# Patient Record
Sex: Male | Born: 1935 | Race: White | Hispanic: No | Marital: Married | Smoking: Former smoker
Health system: Southern US, Community
[De-identification: ages and names within clinical notes are randomized; demographics above are authoritative.]

## PROBLEM LIST (undated history)

## (undated) DIAGNOSIS — G20A1 Parkinson's disease without dyskinesia, without mention of fluctuations: Secondary | ICD-10-CM

## (undated) DIAGNOSIS — E119 Type 2 diabetes mellitus without complications: Secondary | ICD-10-CM

## (undated) DIAGNOSIS — G20 Parkinson's disease: Secondary | ICD-10-CM

## (undated) DIAGNOSIS — E785 Hyperlipidemia, unspecified: Secondary | ICD-10-CM

## (undated) DIAGNOSIS — F32A Depression, unspecified: Secondary | ICD-10-CM

## (undated) DIAGNOSIS — R413 Other amnesia: Secondary | ICD-10-CM

## (undated) DIAGNOSIS — F329 Major depressive disorder, single episode, unspecified: Secondary | ICD-10-CM

## (undated) HISTORY — DX: Major depressive disorder, single episode, unspecified: F32.9

## (undated) HISTORY — DX: Depression, unspecified: F32.A

## (undated) HISTORY — DX: Parkinson's disease: G20

## (undated) HISTORY — DX: Type 2 diabetes mellitus without complications: E11.9

## (undated) HISTORY — DX: Hyperlipidemia, unspecified: E78.5

## (undated) HISTORY — DX: Other amnesia: R41.3

## (undated) HISTORY — DX: Parkinson's disease without dyskinesia, without mention of fluctuations: G20.A1

---
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
## 2010-08-03 ENCOUNTER — Encounter: Payer: Self-pay | Admitting: Gastroenterology

## 2010-08-09 NOTE — Letter (Signed)
Summary: Colonoscopy Letter  [facility]  [address] [place], [place] [address]   Phone: [phone]  Fax: [phone]      August 03, 2010 MRN: [id number]   Matthew [name] [address] [place], [place]  [address]   Dear Mr. Karczewski,   According to your medical record, it is time for you to schedule a Colonoscopy. The American Cancer Society recommends this procedure as a method to detect early colon cancer. Patients with a family history of colon cancer, or a personal history of colon polyps or inflammatory bowel disease are at increased risk.  This letter has been generated based on the recommendations made at the time of your procedure. If you feel that in your particular situation this may no longer apply, please contact our office.  Please call our office at [phone] to schedule this appointment or to update your records at your earliest convenience.  Thank you for cooperating with [place] to provide you with the very best care possible.   Sincerely,  Judie Petit T. Russella Dar, M.D.  [facility] [phone]

## 2011-04-10 ENCOUNTER — Ambulatory Visit: Payer: Medicare Other | Attending: Neurology | Admitting: Physical Therapy

## 2011-04-10 DIAGNOSIS — R269 Unspecified abnormalities of gait and mobility: Secondary | ICD-10-CM | POA: Insufficient documentation

## 2011-04-10 DIAGNOSIS — G20A1 Parkinson's disease without dyskinesia, without mention of fluctuations: Secondary | ICD-10-CM | POA: Insufficient documentation

## 2011-04-10 DIAGNOSIS — IMO0001 Reserved for inherently not codable concepts without codable children: Secondary | ICD-10-CM | POA: Insufficient documentation

## 2011-04-10 DIAGNOSIS — G20 Parkinson's disease: Secondary | ICD-10-CM | POA: Insufficient documentation

## 2011-04-17 ENCOUNTER — Ambulatory Visit: Payer: Medicare Other | Admitting: Rehabilitative and Restorative Service Providers"

## 2011-04-17 ENCOUNTER — Ambulatory Visit: Payer: BC Managed Care – HMO | Admitting: Physical Therapy

## 2011-04-20 ENCOUNTER — Ambulatory Visit: Payer: Medicare Other | Admitting: Physical Therapy

## 2011-04-24 ENCOUNTER — Ambulatory Visit: Payer: Medicare Other | Attending: Neurology | Admitting: Physical Therapy

## 2011-04-24 DIAGNOSIS — G20A1 Parkinson's disease without dyskinesia, without mention of fluctuations: Secondary | ICD-10-CM | POA: Insufficient documentation

## 2011-04-24 DIAGNOSIS — IMO0001 Reserved for inherently not codable concepts without codable children: Secondary | ICD-10-CM | POA: Insufficient documentation

## 2011-04-24 DIAGNOSIS — G20 Parkinson's disease: Secondary | ICD-10-CM | POA: Insufficient documentation

## 2011-04-24 DIAGNOSIS — R269 Unspecified abnormalities of gait and mobility: Secondary | ICD-10-CM | POA: Insufficient documentation

## 2011-04-27 ENCOUNTER — Ambulatory Visit: Payer: Medicare Other | Admitting: Physical Therapy

## 2011-05-01 ENCOUNTER — Ambulatory Visit: Payer: BC Managed Care – HMO | Admitting: Physical Therapy

## 2011-05-02 ENCOUNTER — Ambulatory Visit: Payer: Medicare Other | Admitting: Physical Therapy

## 2011-05-04 ENCOUNTER — Ambulatory Visit: Payer: Medicare Other | Admitting: Physical Therapy

## 2011-05-08 ENCOUNTER — Ambulatory Visit: Payer: Medicare Other | Admitting: Physical Therapy

## 2011-05-11 ENCOUNTER — Ambulatory Visit: Payer: Medicare Other | Admitting: Physical Therapy

## 2011-05-23 HISTORY — PX: CATARACT EXTRACTION W/ INTRAOCULAR LENS  IMPLANT, BILATERAL: SHX1307

## 2012-02-07 ENCOUNTER — Encounter: Payer: Self-pay | Admitting: Gastroenterology

## 2012-02-08 ENCOUNTER — Encounter: Payer: Self-pay | Admitting: Gastroenterology

## 2012-03-14 ENCOUNTER — Ambulatory Visit (AMBULATORY_SURGERY_CENTER): Payer: Medicare Other | Admitting: *Deleted

## 2012-03-14 VITALS — Ht 68.0 in | Wt 197.2 lb

## 2012-03-14 DIAGNOSIS — Z1211 Encounter for screening for malignant neoplasm of colon: Secondary | ICD-10-CM

## 2012-03-14 MED ORDER — MOVIPREP 100 G PO SOLR: ORAL | Status: DC

## 2012-03-28 ENCOUNTER — Ambulatory Visit (AMBULATORY_SURGERY_CENTER): Payer: Medicare Other | Admitting: Gastroenterology

## 2012-03-28 ENCOUNTER — Encounter: Payer: Self-pay | Admitting: Gastroenterology

## 2012-03-28 VITALS — BP 105/61 | HR 73 | Temp 97.7°F | Resp 17 | Ht 68.0 in | Wt 197.0 lb

## 2012-03-28 DIAGNOSIS — Z1211 Encounter for screening for malignant neoplasm of colon: Secondary | ICD-10-CM

## 2012-03-28 DIAGNOSIS — D126 Benign neoplasm of colon, unspecified: Secondary | ICD-10-CM

## 2012-03-28 MED ORDER — SODIUM CHLORIDE 0.9 % IV SOLN: 500.0000 mL | INTRAVENOUS | Status: DC

## 2012-03-28 NOTE — Progress Notes (Signed)
Propofol per S Camp CRNA. See scanned intra procedure report. ewm 

## 2012-03-28 NOTE — Patient Instructions (Addendum)

## 2012-03-28 NOTE — Progress Notes (Signed)
Patient did not experience any of the following events: a burn prior to discharge; a fall within the facility; wrong site/side/patient/procedure/implant event; or a hospital transfer or hospital admission upon discharge from the facility. (G8907) Patient did not have preoperative order for IV antibiotic SSI prophylaxis. (G8918)  

## 2012-03-28 NOTE — Op Note (Addendum)
[facility] [address] [place], [address]   COLONOSCOPY PROCEDURE REPORT  PATIENT: Matthew, [name]  MR#: [id number] BIRTHDATE: Aug 12, 1934 , 77  yrs. old GENDER: Male ENDOSCOPIST: Meryl Dare, MD, [facility] PROCEDURE DATE:  03/28/2012 PROCEDURE:   Colonoscopy with snare polypectomy ASA CLASS:   Class III INDICATIONS: average risk screening. MEDICATIONS: MAC sedation, administered by CRNA and propofol (Diprivan) 270mg  IV DESCRIPTION OF PROCEDURE:   After the risks benefits and alternatives of the procedure were thoroughly explained, informed consent was obtained.  A digital rectal exam revealed no abnormalities of the rectum.   The     endoscope was introduced through the anus and advanced to the cecum, which was identified by both the appendix and ileocecal valve. No adverse events experienced.   The quality of the prep was adequate, using MoviPrep The instrument was then slowly withdrawn as the colon was fully examined.  COLON FINDINGS: A sessile polyp measuring 8 mm in size was found in the sigmoid colon.  A polypectomy was performed with a cold snare. The resection was complete and the polyp tissue was completely retrieved.   Moderate diverticulosis was noted in the sigmoid colon and descending colon.   The colon was otherwise normal.  There was no diverticulosis, inflammation, polyps or cancers unless previously stated.  Retroflexed views revealed no abnormalities. The time to cecum=4 minutes 05 seconds.  Withdrawal time=15 minutes 15 seconds.  The scope was withdrawn and the procedure completed.  COMPLICATIONS: There were no complications.  ENDOSCOPIC IMPRESSION: 1.   Sessile polyp in the sigmoid colon; polypectomy performed with a cold snare 2.   Moderate diverticulosis was noted in the sigmoid colon and descending colon  RECOMMENDATIONS: 1.  Await pathology results 2.  Consider repeat colonoscopy in 5 years if polyp is adenomatous; otherwise  no plans for future colonoscopies for screening due to age  eSigned:  Meryl Dare, MD, [facility] 03/28/2012 11:50 AM Revised: 03/28/2012 11:50 AM  CC: Guerry Bruin, MD

## 2012-03-29 ENCOUNTER — Telehealth: Payer: Self-pay | Admitting: *Deleted

## 2012-03-29 NOTE — Telephone Encounter (Signed)
Busy tone attempted x3

## 2012-04-02 ENCOUNTER — Encounter: Payer: Self-pay | Admitting: Gastroenterology

## 2012-05-14 ENCOUNTER — Ambulatory Visit (INDEPENDENT_AMBULATORY_CARE_PROVIDER_SITE_OTHER): Payer: Medicare Other | Admitting: Family Medicine

## 2012-05-14 ENCOUNTER — Ambulatory Visit: Payer: Medicare Other

## 2012-05-14 VITALS — BP 114/82 | HR 83 | Temp 98.8°F | Resp 18 | Ht 68.0 in | Wt 192.0 lb

## 2012-05-14 DIAGNOSIS — R509 Fever, unspecified: Secondary | ICD-10-CM

## 2012-05-14 DIAGNOSIS — R059 Cough, unspecified: Secondary | ICD-10-CM

## 2012-05-14 DIAGNOSIS — R05 Cough: Secondary | ICD-10-CM

## 2012-05-14 DIAGNOSIS — R062 Wheezing: Secondary | ICD-10-CM

## 2012-05-14 DIAGNOSIS — R918 Other nonspecific abnormal finding of lung field: Secondary | ICD-10-CM

## 2012-05-14 DIAGNOSIS — R9389 Abnormal findings on diagnostic imaging of other specified body structures: Secondary | ICD-10-CM

## 2012-05-14 LAB — POCT CBC
Granulocyte percent: 77.1 %G (ref 37–80)
HCT, POC: 47.3 % (ref 43.5–53.7)
Hemoglobin: 14.5 g/dL (ref 14.1–18.1)
Lymph, poc: 1.6 (ref 0.6–3.4)
MCH, POC: 30.3 pg (ref 27–31.2)
MCHC: 30.7 g/dL — AB (ref 31.8–35.4)
MCV: 98.7 fL — AB (ref 80–97)
MID (cbc): 0.8 (ref 0–0.9)
MPV: 7.5 fL (ref 0–99.8)
POC Granulocyte: 7.8 — AB (ref 2–6.9)
POC LYMPH PERCENT: 15.4 %L (ref 10–50)
POC MID %: 7.5 %M (ref 0–12)
Platelet Count, POC: 270 10*3/uL (ref 142–424)
RBC: 4.79 M/uL (ref 4.69–6.13)
RDW, POC: 13.2 %
WBC: 10.1 10*3/uL (ref 4.6–10.2)

## 2012-05-14 MED ORDER — ALBUTEROL SULFATE HFA 108 (90 BASE) MCG/ACT IN AERS: 2.0000 | INHALATION_SPRAY | Freq: Four times a day (QID) | RESPIRATORY_TRACT | Status: DC | PRN

## 2012-05-14 MED ORDER — MOXIFLOXACIN HCL 400 MG PO TABS: 400.0000 mg | ORAL_TABLET | Freq: Every day | ORAL | Status: DC

## 2012-05-14 MED ORDER — ALBUTEROL SULFATE (2.5 MG/3ML) 0.083% IN NEBU
2.5000 mg | INHALATION_SOLUTION | Freq: Once | RESPIRATORY_TRACT | Status: AC
  Administered 2012-05-14: 2.5 mg via RESPIRATORY_TRACT

## 2012-05-14 NOTE — Progress Notes (Signed)
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  Objective:  Elderly gentleman with audible wheezing, no tachypnea or labored breathing HEENT:  Clear oroph with mucopurulent nasal mucous, TM's dull Neck: supple, no adenopathy Chest:  Exp. Wheezing, diffuse bibasilar rales Heart: regular, no murmur or gallop Ext:  No edema or calf tenderness. [facility] reading (PRIMARY) by  Dr. Milus Glazier  CXR: hazy RML with diffuse heavy markings.  Results for orders placed in visit on 05/14/12  POCT CBC      Component Value Range   WBC 10.1  4.6 - 10.2 K/uL   Lymph, poc 1.6  0.6 - 3.4   POC LYMPH PERCENT 15.4  10 - 50 %L   MID (cbc) 0.8  0 - 0.9   POC MID % 7.5  0 - 12 %M   POC Granulocyte 7.8 (*) 2 - 6.9   Granulocyte percent 77.1  37 - 80 %G   RBC 4.79  4.69 - 6.13 M/uL   Hemoglobin 14.5  14.1 - 18.1 g/dL   HCT, POC 16.1  09.6 - 53.7 %   MCV 98.7 (*) 80 - 97 fL   MCH, POC 30.3  27 - 31.2 pg   MCHC 30.7 (*) 31.8 - 35.4 g/dL   RDW, POC 04.5     Platelet Count, POC 270  142 - 424 K/uL   MPV 7.5  0 - 99.8 fL    Patient has significant improvement in breath sounds (decrease wheezing) after albuterol nebulizer provided here in the clinic  Assessment:  Suspicious for early pneumonia  Plan:   1. Wheezing  DG Chest 2 View, POCT CBC, albuterol (PROVENTIL) (2.5 MG/3ML) 0.083% nebulizer solution 2.5 mg, albuterol (PROVENTIL HFA;VENTOLIN HFA) 108 (90 BASE) MCG/ACT inhaler  2. Cough  DG Chest 2 View, POCT CBC, moxifloxacin (AVELOX) 400 MG tablet  3. Fever  DG Chest 2 View, POCT CBC, moxifloxacin (AVELOX) 400 MG tablet  4. Abnormal CXR  moxifloxacin (AVELOX) 400 MG tablet, albuterol (PROVENTIL) (2.5 MG/3ML) 0.083% nebulizer solution 2.5 mg   Follow up in 48 hours, sooner if wheezing becomes more problematic

## 2012-05-14 NOTE — Patient Instructions (Addendum)
Using Your Inhaler  Proper inhaler technique is very important. Good technique assures that the medicine reaches the lungs. Poor technique results in depositing the medicine on the tongue and back of the throat rather than in the airways.  STEPS TO FOLLOW IF USING INHALER WITHOUT EXTENSION TUBE:  1. Remove cap from inhaler.  2. Shake inhaler for 5 seconds before each inhalation (breathing in).  3. Position the inhaler so that the top of the canister faces up.  4. Put your index finger on the top of the medication canister. Your thumb supports the bottom of the inhaler.  5. Open your mouth.  6. Hold the inhaler 1 to 2 inches away from your open mouth. This allows the medicine to slow down before the medicine enters the mouth.  7. Exhale (breathe out) normally and as completely as possible.  8. Press the canister down with the index finger to release the medication.  9. At the same time as the canister is pressed, inhale deeply and slowly until the lungs are completely filled. This should take 4 to 6 seconds. Keep your tongue down and out of the way.  10. Hold the medication in your lungs for up to 10 seconds (10 seconds is best). This helps the medicine get into the small airways of your lungs to work better.  11. Breathe out slowly, through pursed lips. Whistling is an example of pursed lips.  12. Wait at least 1 minute between puffs. Continue with the above steps until you have taken the number of puffs your caregiver has ordered.  13. Replace cap on inhaler.  STEPS TO FOLLOW USING AN INHALER WITH AN EXTENSION (SPACER):  1.  Remove cap from inhaler.  2. Shake inhaler for 5 seconds before each inhalation (breathing in).  3. Your caregiver has asked you to use a spacer with your inhaler. A spacer is a plastic tube with a mouthpiece on one end and an opening that connects to the inhaler on the other end. A spacer helps you take the medicine better.   4. Place the open end of the spacer onto the mouthpiece of the inhaler.  5. Position the inhaler so that the top of the canister faces up and the spacer mouthpiece faces you.  6. Put your index finger on the top of the medication canister. Your thumb supports the bottom of the inhaler and the spacer.  7. Exhale (breathe out) normally and as completely as possible.  8. Immediately after exhaling, place the spacer between your teeth and into your mouth. Close your mouth tightly around the spacer.  9. Press the canister down with the index finger to release the medication.  10. At the same time as the canister is pressed, inhale deeply and slowly until the lungs are completely filled. This should take 4 to 6 seconds. Keep your tongue down and out of the way.  11. Hold the medication in your lungs for up to 10 seconds (10 seconds is best). This helps the medicine get into the small airways of your lungs to work better. Exhale.  12. Repeat inhaling deeply through the spacer mouthpiece. Again hold that breath for up to 10 seconds (10 seconds is best). Exhale slowly. If it is difficult to take this second deep breath through the spacer, breathe normally several times through the spacer. Remove the spacer from your mouth.  13. Wait at least 1 minute between puffs. Continue with the above steps until you have taken the number of puffs   your caregiver has ordered.  14. Remove spacer from the inhaler and place cap on inhaler.  If you are using different kinds of inhalers, use your quick relief medicine to open the airways 10 - 15 minutes before using a steroid. If you are unsure which inhalers to use and the order of using them, ask your caregiver, nurse, or respiratory therapist.  If you are using a steroid inhaler, rinse your mouth with water after your last puff and then spit out the water. Do not swallow the water.  Avoid the following:    Inhaling before or after starting the spray of medicine. It takes practice to coordinate your breathing with triggering the spray.   Inhaling through the nose (rather than the mouth) when triggering the spray.  HOW TO DETERMINE IF YOUR INHALER IS FULL OR NEARLY EMPTY:   Determine when an inhaler is empty. You cannot know when an inhaler is empty by shaking it. A few inhalers are now being made with dose counters. Ask your caregiver for a prescription that has a dose counter if you feel you need that extra help.   If your inhaler does not have a counter, check the number of doses in the inhaler before you use it. The canister or box will list the number of doses in the canister. Divide the total number of doses in the canister by the number you will use each day to find how many days the canister will last. (For example, if your canister has 200 doses and you take 2 puffs, 4 times each day, which is 8 puffs a day. Dividing 200 by 8 equals 25. The canister should last 25 days.) Using a calendar, count forward that many days to see when your inhaler will run out. Write the refill date on a calendar or your canister.   Remember, if you need to take extra doses, the inhaler will empty sooner than you figured. Be sure you have a refill before your canister runs out. Refill your inhaler 7 to 10 days before it runs out.  HOME CARE INSTRUCTIONS     Do not use the inhaler more than your caregiver tells you. If you are still wheezing and are feeling tightness in your chest, call your caregiver.   Keep an adequate supply of medication. This includes making sure the medicine is not expired, and you have a spare inhaler.   Follow your caregiver or inhaler insert directions for cleaning the inhaler and spacer.  SEEK MEDICAL CARE IF:     Symptoms are only partially relieved with your inhaler.   You are having trouble using your inhaler.   You experience some increase in phlegm.    You develop a fever of 100.5 F (38.1 C).  SEEK IMMEDIATE MEDICAL CARE IF:     You feel little or no relief with your inhalers. You are still wheezing and are feeling shortness of breath and/or tightness in your chest.   You have side effects such as dizziness, headaches or fast heart rate.   You have chills, fever, night sweats or an oral temperature above 102 F (38.9 C).   Phlegm production increases a lot, or there is blood in the phlegm.  MAKE SURE YOU:     Understand these instructions.   Will watch your condition.   Will get help right away if you are not doing well or get worse.  Document Released: 05/05/2000 Document Revised: 07/31/2011 Document Reviewed: 02/23/2009  ExitCare Patient Information 2013 [employer].

## 2012-09-12 ENCOUNTER — Telehealth: Payer: Self-pay | Admitting: *Deleted

## 2012-09-12 NOTE — Telephone Encounter (Signed)
patient is requesting gralise-600mg  prescription,faxed to [facility] 90day supply.

## 2012-09-13 MED ORDER — GABAPENTIN (ONCE-DAILY) 600 MG PO TABS: 1200.0000 mg | ORAL_TABLET | Freq: Every day | ORAL | Status: DC

## 2012-09-18 ENCOUNTER — Ambulatory Visit (INDEPENDENT_AMBULATORY_CARE_PROVIDER_SITE_OTHER): Payer: Medicare Other | Admitting: Nurse Practitioner

## 2012-09-18 ENCOUNTER — Encounter: Payer: Self-pay | Admitting: Nurse Practitioner

## 2012-09-18 VITALS — BP 109/70 | HR 81 | Ht 66.0 in | Wt 191.0 lb

## 2012-09-18 DIAGNOSIS — G20A1 Parkinson's disease without dyskinesia, without mention of fluctuations: Secondary | ICD-10-CM

## 2012-09-18 DIAGNOSIS — R413 Other amnesia: Secondary | ICD-10-CM

## 2012-09-18 DIAGNOSIS — G20 Parkinson's disease: Secondary | ICD-10-CM

## 2012-09-18 DIAGNOSIS — R269 Unspecified abnormalities of gait and mobility: Secondary | ICD-10-CM

## 2012-09-18 NOTE — Progress Notes (Signed)
HPI: Patient returns for followup after last visit with Dr. Vickey Huger 01/10/2011. Has a history of Parkinson's disease. He reports that he has had good tremor control he has had several falls in the last year and a half, mostly when he was not paying attention to what he was doing. He denies shuffling gait,  freezing spells, stiffness,  difficulty turning over in bed, swallowing difficulty, hypophonia. He had a neuro psychiatric testing at [facility] January 2013 he was felt overall that he might have mild cognitive impairment. It was felt that his symptoms could also be caused by situational factors and this should be followed over time. He has no new complaints today   ROS:  neg  Physical Exam General: well developed, well nourished, seated, in no evident distress Head: head normocephalic and atraumatic. Oropharynx benign Neck: supple with no carotid or supraclavicular bruits Cardiovascular: regular rate and rhythm, no murmurs  Neurologic Exam Mental Status: Awake and fully alert. Oriented to place and time.  Attention span, concentration and fund of knowledge appropriate. Mood and affect appropriate.  Cranial Nerves: Fundoscopic exam reveals sharp disc margins. Pupils equal, briskly reactive to light. Extraocular movements full without nystagmus. Visual fields full to confrontation. Hearing intact and symmetric to finger snap. Left lower facial droop mild. Tongue, palate move normally and symmetrically. Neck flexion and extension normal.  Motor: Normal bulk and tone. Normal strength in all tested extremity muscles. No rigidity Sensory.: intact to touch and pinprick and vibratory.  Coordination: Rapid alternating movements slowed  Finger-to-nose performed accurately bilaterally. Gait and Station: Arises from chair without arms. Stance is wide base. Ambulated 210 feet in 1 minute 15 seconds. No decreased arm swing. No difficulty with turns   Reflexes: 2+ and symmetric. Toes downgoing.      ASSESSMENT: Parkinson's disease currently stable with Mirapex 0.25 mg twice daily  Mild cognitive impairment per neurocognitive testing patient wants to wait until next visit to get retested      PLAN:  Continue Mirapex 0.25 twice daily, call for refills Neuropsych testing to be repeated sometime within the next year Continue to exercise for overall general health and well-being Healthy diet Followup in 6 months  Nilda Riggs, GNP-BC APRN

## 2012-09-18 NOTE — Patient Instructions (Addendum)
Parkinson's disease is stable Continue Mirapex 0.25 twice daily, call for refills Neuropsych testing to be repeated sometime within the next year Continue to exercise for overall general health and well-being Healthy diet Followup in 6 months

## 2013-03-20 ENCOUNTER — Ambulatory Visit: Payer: Medicare Other | Admitting: Nurse Practitioner

## 2013-04-01 ENCOUNTER — Ambulatory Visit (INDEPENDENT_AMBULATORY_CARE_PROVIDER_SITE_OTHER): Payer: Medicare Other | Admitting: Nurse Practitioner

## 2013-04-01 ENCOUNTER — Encounter (INDEPENDENT_AMBULATORY_CARE_PROVIDER_SITE_OTHER): Payer: Self-pay

## 2013-04-01 ENCOUNTER — Telehealth: Payer: Self-pay | Admitting: Nurse Practitioner

## 2013-04-01 ENCOUNTER — Encounter: Payer: Self-pay | Admitting: Nurse Practitioner

## 2013-04-01 VITALS — BP 106/70 | HR 71 | Ht 68.25 in | Wt 193.0 lb

## 2013-04-01 DIAGNOSIS — R269 Unspecified abnormalities of gait and mobility: Secondary | ICD-10-CM

## 2013-04-01 DIAGNOSIS — G20 Parkinson's disease: Secondary | ICD-10-CM

## 2013-04-01 DIAGNOSIS — R413 Other amnesia: Secondary | ICD-10-CM

## 2013-04-01 DIAGNOSIS — G20A1 Parkinson's disease without dyskinesia, without mention of fluctuations: Secondary | ICD-10-CM

## 2013-04-01 MED ORDER — GABAPENTIN (ONCE-DAILY) 600 MG PO TABS: 1200.0000 mg | ORAL_TABLET | Freq: Every day | ORAL | Status: DC

## 2013-04-01 NOTE — Patient Instructions (Signed)
Parkinson's symptoms are stable continue Mirapex call for refills We will renew Gralise 3 month  With refills  F/U 6 months  Next visit with Dr. Vickey Huger

## 2013-04-01 NOTE — Progress Notes (Signed)
[facility]  PATIENT: TYREE FLUHARTY DOB: 12-12-1934   REASON FOR VISIT: Followup for Parkinson's disease   HISTORY OF PRESENT ILLNESS: Mr. Scarfo, 78 year old male returns for followup. He has a history of Parkinson's disease with good control of tremor. He denies any falls since last seen. He does not use an assistive device. He feels  his Parkinson's disease is stable. He denies any shuffling gait, freezing spells, stiffness difficulty turning over in bed, hypophonia or swallowing difficulties. He needs refills today. No new neurologic complaints   HISTORY: Has a history of Parkinson's disease. He reports that he has had good tremor control he has had several falls in the last year and a half, mostly when he was not paying attention to what he was doing. He denies shuffling gait, freezing spells, stiffness, difficulty turning over in bed, swallowing difficulty, hypophonia. He had a neuro psychiatric testing at [facility] January 2013 he was felt overall that he might have mild cognitive impairment. It was felt that his symptoms could also be caused by situational factors and this should be followed over time. He has no new complaints today    REVIEW OF SYSTEMS: Full 14 system review of systems performed and notable only for:  Constitutional: N/A  Cardiovascular: N/A  Ear/Nose/Throat: N/A  Skin: N/A  Eyes: N/A  Respiratory: N/A  Gastroitestinal: N/A  Hematology/Lymphatic: N/A  Endocrine: N/A Musculoskeletal:N/A  Allergy/Immunology: N/A  Neurological: N/A Psychiatric: Depression, seen by Dr. Talbert Forest  ALLERGIES: No Known Allergies  HOME MEDICATIONS: Outpatient Prescriptions Prior to Visit  Medication Sig Dispense Refill  . atorvastatin (LIPITOR) 40 MG tablet Take 40 mg by mouth daily.      . Gabapentin, PHN, (GRALISE) 600 MG TABS Take 1,200 mg by mouth daily.  180 tablet  0  . glimepiride (AMARYL) 2 MG tablet Take 2 mg by mouth daily before breakfast.       . Linagliptin-Metformin HCl (JENTADUETO) 2.5-500 MG TABS Take by mouth 2 (two) times daily.      Marland Kitchen MOVIPREP 100 G SOLR moviprep as directed. No substitutions  1 kit  0  . moxifloxacin (AVELOX) 400 MG tablet Take 1 tablet (400 mg total) by mouth daily.  7 tablet  0  . PARoxetine (PAXIL) 20 MG tablet Take 20 mg by mouth every morning.      . pramipexole (MIRAPEX) 0.25 MG tablet Take 0.25 mg by mouth 2 (two) times daily.      Marland Kitchen zolpidem (AMBIEN) 10 MG tablet Take 10 mg by mouth at bedtime as needed.      Marland Kitchen albuterol (PROVENTIL HFA;VENTOLIN HFA) 108 (90 BASE) MCG/ACT inhaler Inhale 2 puffs into the lungs every 6 (six) hours as needed for wheezing.  1 Inhaler  0   No facility-administered medications prior to visit.    PAST MEDICAL HISTORY: Past Medical History  Diagnosis Date  . Depression   . Diabetes mellitus   . Hyperlipidemia   . Parkinson's disease   . Memory loss     PAST SURGICAL HISTORY: Past Surgical History  Procedure Laterality Date  . Tonsillectomy  1944  . Cataract extraction w/ intraocular lens  implant, bilateral  2013    bilateral    FAMILY HISTORY: Family History  Problem Relation Age of Onset  . Colon cancer Neg Hx   . Stomach cancer Neg Hx     SOCIAL HISTORY: History   Social History  . Marital Status: Married    Spouse Name: Earley Abide    Number  of Children: 1  . Years of Education: 12   Occupational History  . Not on file.   Social History Main Topics  . Smoking status: Former Smoker    Types: Cigarettes    Quit date: 03/14/1974  . Smokeless tobacco: Never Used  . Alcohol Use: No  . Drug Use: No  . Sexual Activity: Not on file   Other Topics Concern  . Not on file   Social History Narrative   Patient is married Licensed conveyancer).   Patient has one son.   Patient is retired.   Patient has a high school education.   Patient is left-handed.   Patient drinks about 20 cups of coffee daily.     PHYSICAL EXAM  Filed Vitals:   04/01/13 1423    BP: 106/70  Pulse: 71  Height: 5' 8.25" (1.734 m)  Weight: 193 lb (87.544 kg)   Body mass index is 29.12 kg/(m^2).  Generalized: Well developed, in no acute distress  Head: normocephalic and atraumatic,. Oropharynx benign  Neck: Supple, no carotid bruits  Cardiac: Regular rate rhythm, no murmur  Musculoskeletal: Brace to right knee Neurological examination   Mentation: Alert oriented to time, place, history taking. Follows all commands speech and language fluent  Cranial nerve II-XII: Pupils were equal round reactive to light extraocular movements were full, visual field were full on confrontational test. Mild left facial droop. Hearing was intact to finger rubbing bilaterally. Uvula tongue midline. head turning and shoulder shrug and were normal and symmetric.Tongue protrusion into cheek strength was normal. Motor: normal bulk and tone, full strength in the BUE, BLE, fine finger movements normal, no pronator drift. No focal weakness, no cogwheeling Sensory: normal and symmetric to light touch, pinprick, and  vibration  Coordination: finger-nose-finger, no dysmetria Reflexes: Brachioradialis 2/2, biceps 2/2, triceps 2/2, patellar 2/2, Achilles 2/2, plantar responses were flexor bilaterally. Gait and Station: Rising up from seated position without assistance, wide based  Stance. Ambulated 210 feet in 1 minute 10 seconds,  good arm swing, smooth turning.   DIAGNOSTIC DATA (LABS, IMAGING, TESTING) - I reviewed patient records, labs, notes, testing and imaging myself where available.  Lab Results  Component Value Date   WBC 10.1 05/14/2012   HGB 14.5 05/14/2012   HCT 47.3 05/14/2012   MCV 98.7* 05/14/2012     ASSESSMENT AND PLAN  78 y.o. year old male  has a past medical history of Depression; Diabetes mellitus; Hyperlipidemia; Parkinson's disease; and Memory loss. here in followup. He does not want repeat neuro cognitive testing  Parkinson's symptoms are stable continue Mirapex  call for refills We will renew Gralise 3 month  With refills  F/U 6 months  Next visit with Dr. Vickey Huger  Nilda Riggs, [facility], APRN  [facility] [address] [place], [place] [address] [phone]

## 2013-04-13 ENCOUNTER — Other Ambulatory Visit: Payer: Self-pay | Admitting: Neurology

## 2013-10-01 ENCOUNTER — Ambulatory Visit: Payer: Medicare Other | Admitting: Neurology

## 2013-10-07 ENCOUNTER — Ambulatory Visit: Payer: Medicare Other | Admitting: Adult Health

## 2013-10-21 ENCOUNTER — Telehealth: Payer: Self-pay | Admitting: Neurology

## 2013-10-21 ENCOUNTER — Encounter: Payer: Self-pay | Admitting: Neurology

## 2013-10-21 ENCOUNTER — Encounter (INDEPENDENT_AMBULATORY_CARE_PROVIDER_SITE_OTHER): Payer: Self-pay

## 2013-10-21 ENCOUNTER — Ambulatory Visit (INDEPENDENT_AMBULATORY_CARE_PROVIDER_SITE_OTHER): Payer: Commercial Managed Care - HMO | Admitting: Neurology

## 2013-10-21 VITALS — BP 107/71 | HR 66 | Resp 18 | Wt 189.0 lb

## 2013-10-21 DIAGNOSIS — G252 Other specified forms of tremor: Secondary | ICD-10-CM

## 2013-10-21 DIAGNOSIS — G20 Parkinson's disease: Secondary | ICD-10-CM | POA: Insufficient documentation

## 2013-10-21 DIAGNOSIS — G250 Essential tremor: Secondary | ICD-10-CM

## 2013-10-21 DIAGNOSIS — R488 Other symbolic dysfunctions: Secondary | ICD-10-CM

## 2013-10-21 DIAGNOSIS — G4723 Circadian rhythm sleep disorder, irregular sleep wake type: Secondary | ICD-10-CM | POA: Insufficient documentation

## 2013-10-21 DIAGNOSIS — G4701 Insomnia due to medical condition: Secondary | ICD-10-CM

## 2013-10-21 DIAGNOSIS — F482 Pseudobulbar affect: Secondary | ICD-10-CM

## 2013-10-21 MED ORDER — PAROXETINE HCL 20 MG PO TABS: 20.0000 mg | ORAL_TABLET | Freq: Every evening | ORAL | Status: DC

## 2013-10-21 MED ORDER — PRAMIPEXOLE DIHYDROCHLORIDE 0.25 MG PO TABS: 0.2500 mg | ORAL_TABLET | ORAL | Status: DC

## 2013-10-21 NOTE — Addendum Note (Signed)
Addended by: Larey Seat on: 10/21/2013 02:16 PM   Modules accepted: Orders

## 2013-10-21 NOTE — Telephone Encounter (Signed)
Left message for patient regarding appointment with Dr. Rexene Alberts on 01/22/14 per Dr. Edwena Felty instructions.

## 2013-10-21 NOTE — Progress Notes (Signed)
[facility]   Provider:  Larey Seat, [place] D  Referring Provider: Haywood Pao, MD Primary Care Physician:  Matthew Pao, MD  Chief Complaint  Patient presents with  . Follow-up    Room 10  . Parkinson's    mmse  29/30  AFT- 11  . Memory Loss    HPI:  Matthew [name] is a 79 y.o.. Caucasian, mattied, left handed  male, who   is seen here as a  revisit  from Dr. Osborne [name] for a  gait disorder , not in the classical manifestation of the PD  disease and sleep disorder. He has microsleeps on Mirapex.   MatthewPena has little to no tremor,but sudden overshooting movements - myoclonus.    Matthew [name] reports having increasing difficulties walking, currently suffering a lot of pain form knee arthritis, right knee. Due to his poorly controlled diabetes, a surgery was not planned yet.  He has fallen about once a week, his voice is still dysphonic but he can modulate the volume well. He has no dysphagia.  When he walks he tends to keep his upper body forward bend and he falls exactly that way- "his tiltometer is off". He also is sleepy all day and has trouble to sleep in the night.  His son was just diagnosed with sleep apnea and started using a CPAP, which made him concerned about having the same condition.  He has 2 -3 nocturia breaks at night, likes to drink coffee thorughout the day. He watches TV from his recliner , not in the bedroom. He can not s longer sleep in his bed. He prefers to sleep in a recliner. He loves the TV for background noise.  He snores loudly and breathe irregular, his wife confirmed. His wife craves a cool, dark and quiet bedroom instead. He is sleeping with a bright light on.  He falls asleep anytime w between 10 AM and 10 PM, but once in the bedroom he is worrying about things and cannot find rest.  Takes Paxil for affect incontinence in the evening - I asked him to change that.  We will evaluate him with a SPLIT  study.   Review of Systems: Out of a complete 14 system review, the patient complains of only the following symptoms, and all other reviewed systems are negative.  A depression score was 4 points today-  the Epworth sleepiness score was not obtained, neither the fatigue severity score, a Mini-Mental status examination revealed 29/30 points.  How likely are you to doze in the following situations: 0 = not likely, 1 = slight chance, 2 = moderate chance, 3 = high chance  Sitting and Reading?                                                      3       Watching Television?                                                     3 Sitting inactive in a public place (theater, church , waiting room  or meeting)?  3 Lying down in the afternoon when circumstances  permit? 2 Sitting and talking to someone?                                         0 Sitting quietly after lunch without alcohol?                        2 In a car, while stopped for a few minutes in traffic?         3 As a passenger in a car for an hour without a break?      2  Total =  18 points    History   Social History  . Marital Status: Married    Spouse Name: Matthew [name]    Number of Children: 1  . Years of Education: 12   Occupational History  . Not on file.   Social History Main Topics  . Smoking status: Former Smoker    Types: Cigarettes    Quit date: 03/14/1974  . Smokeless tobacco: Never Used  . Alcohol Use: No  . Drug Use: No  . Sexual Activity: Not on file   Other Topics Concern  . Not on file   Social History Narrative   Patient is married Personal assistant).   Patient has one son.   Patient is retired.   Patient has a high school education.   Patient is left-handed.   Patient drinks about 20 cups of coffee daily.    Family History  Problem Relation Age of Onset  . Colon cancer Neg Hx   . Stomach cancer Neg Hx   . Dementia Mother     Past Medical History  Diagnosis Date  . Depression   . Diabetes mellitus    . Hyperlipidemia   . Parkinson's disease   . Memory loss     Past Surgical History  Procedure Laterality Date  . Tonsillectomy  1944  . Cataract extraction w/ intraocular lens  implant, bilateral  2013    bilateral    Current Outpatient Prescriptions  Medication Sig Dispense Refill  . ACCU-CHEK COMPACT PLUS test strip daily.      Marland Kitchen ACCU-CHEK SOFTCLIX LANCETS lancets daily.      Marland Kitchen atorvastatin (LIPITOR) 40 MG tablet Take 40 mg by mouth daily.      . Gabapentin, PHN, (GRALISE) 600 MG TABS Take 1,200 mg by mouth daily.  180 tablet  1  . glimepiride (AMARYL) 2 MG tablet Take 2 mg by mouth daily before breakfast.      . INVOKANA 300 MG TABS 1 tablet daily.      . Linagliptin-Metformin HCl (JENTADUETO) 2.5-500 MG TABS Take by mouth 2 (two) times daily.      Marland Kitchen LORazepam (ATIVAN) 0.5 MG tablet 1 tablet at bedtime.      Marland Kitchen PARoxetine (PAXIL) 20 MG tablet Take 20 mg by mouth every morning.      . pramipexole (MIRAPEX) 0.25 MG tablet TAKE 1 TABLET IN THE       MORNING AND 1 TABLET AT    NOON  180 tablet  1  . temazepam (RESTORIL) 15 MG capsule as needed.      . zolpidem (AMBIEN) 10 MG tablet Take 10 mg by mouth at bedtime as needed.       No current facility-administered medications for this visit.    Allergies as of 10/21/2013  . (No Known Allergies)  Vitals: BP 107/71  Pulse 66  Resp 18  Wt 189 lb (85.73 kg) Last Weight:  Wt Readings from Last 1 Encounters:  10/21/13 189 lb (85.73 kg)   Last Height:   Ht Readings from Last 1 Encounters:  04/01/13 5' 8.25" (1.734 m)    Physical exam:  General: The patient is awake, alert and appears not in acute distress. The patient is well groomed. Head: Normocephalic, atraumatic. Neck is supple. Mallampati 2, neck circumference:17.5 inches ,  TMJ.  Cardiovascular:  Regular rate and rhythm, without  murmurs or carotid bruit, and without distended neck veins. Respiratory: Lungs are clear to auscultation. Skin:  Without evidence of  edema, or rash Trunk: BMI is  elevated and patient  has propulsion while walking, "bend"  posture.  Neurologic exam : The patient is awake and alert, oriented to place and time.  Memory subjective  described as intact.  There is a normal attention span & concentration ability. Speech is fluent without  dysarthria, but mild  dysphonia , not  aphasia. Mood and affect are appropriate on PAXIL- the patient had pseudobulbar affects. . The patient reports inability to write cursive and instead has to slowly print. " words cannot go to paper as I intended " and " I write the wrong letter "-   Cranial nerves: Pupils are equal and briskly reactive to light. Funduscopic exam without  evidence of pallor or edema.  Extraocular movements  in vertical and horizontal planes intact and without nystagmus. Visual fields by finger perimetry are restricted upward.  Hearing to finger rub intact, tinnitus - left .  Facial sensation intact to fine touch.  Facial motor strength is asymmetric- slight droop on the left side- he has facial zoster at that location. Tongue and uvula move midline.  Motor exam:   Elevated  Tone, cogwheeling on the left biceps.  normal muscle bulk and symmetricstrength in all extremities.  Sensory:  Fine touch, pinprick and vibration were tested , reduced over ankles.  Proprioception is decreased in feet.   Coordination: Rapid alternating movements in the fingers/hands is tested and slowed  Finger-to-nose maneuver with  evidence of  dysmetria or tremor. The tremor is atypical for PD.   Gait and station: Patient walks without assistive device , needs assistance to  climb up to the exam table.  Shuffling gait . Stance is stable and normal. Turns are fragmented. Romberg testing is  Positive for forward drift. .  Deep tendon reflexes: in the upper and lower extremities are brisk . symmetric and intact.   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 Shuffling gait.  No dysphagia.  2) poor lseep hygiene. 3) mirapex likely induced excessive sleepiness. Paxil should be taken in AM , as it can induce RLS,/PLMs 4)MMSE 29-30 confirmed normal mental status.   Plan:  Treatment plan and additional workup : Reduce Mirapex to one in PM - near bedtime.  Sleep time should be 11 Pm. No caffeine after lunch.  Paxil in AM . SPLIT study at [facility] . Dr. Rexene Alberts for a movement disorder consult, this patient is little medication .                HISTORY OF PRESENT ILLNESS: last visit with CM;  Mr. Kirn, 79 year old male returns for followup. He has a history of Parkinson's disease with good control  of tremor. He denies any falls since last seen. He does not use an assistive device. He feels  his Parkinson's disease is stable. He denies any shuffling gait, freezing spells, stiffness difficulty turning over in bed, hypophonia or swallowing difficulties. He needs refills today. No new neurologic complaints   HISTORY: Has a history of Parkinson's disease. He reports that he has had good tremor control he has had several falls in the last year and a half, mostly when he was not paying attention to what he was doing. He denies shuffling gait, freezing spells, stiffness, difficulty turning over in bed, swallowing difficulty, hypophonia. He had a neuro psychiatric testing at [facility] January 2013 he was felt overall that he might have mild cognitive impairment. It was felt that his symptoms could also be caused by situational factors and this should be followed over time. He has no new complaints today   [facility] [address] [place], [place] [address] [phone]

## 2013-10-21 NOTE — Patient Instructions (Signed)
Progressive Supranuclear Palsy Progressive supranuclear palsy (PSP) is a rare brain disorder. It causes serious and permanent problems with control of gait and balance. The symptoms of PSP are caused by a gradual deterioration of brain cells in a few tiny but important places at the base of the brain. This region is called the brainstem. SYMPTOMS   The most obvious sign of the disease is an inability to aim the eyes properly. This happens because of lesions in the area of the brain that coordinates eye movements. Some patients describe this effect as a blurring.  PSP patients often show changes mood and behavior. This includes:  Depression.  Apathy.  Progressive mild dementia.  The pattern of signs and symptoms can vary greatly from person to person. DIAGNOSIS  PSP is often misdiagnosed because some of its symptoms are very much like those of:  Parkinson's disease.  Alzheimer's disease.  Rare neurodegenerative disorders, such as Creutzfeldt-Jakob disease. The key to establishing the diagnosis of PSP is the identification of early gait instability and difficulty moving the eyes, the hallmark of the disease. Also important is ruling out other similar disorders, some of which are treatable. Although PSP gets progressively worse, no one dies from PSP itself. TREATMENT  There is currently no effective treatment for PSP. Scientists are searching for better ways to manage the disease.  In some patients, the slowness, stiffness, and balance problems of PSP may respond to antiparkinsonian agents such as levodopa, or levodopa combined with anticholinergic agents. But the effect is usually temporary.  The speech, vision, and swallowing difficulties usually do not respond to any drug treatment. Another group of drugs that has been of some modest success in PSP are antidepressant medications. The most commonly used of these drugs are Elavil, and Tofranil. The anti-PSP benefit of these drugs seems  not to be related to their ability to relieve depression.  Non-drug treatment for PSP can take many forms. Patients often use weighted walking aids because of their tendency to fall backward.  Bifocals or special glasses called prisms are sometimes prescribed for PSP patients. They can correct the difficulty of looking down.  Formal physical therapy is of no proven benefit in PSP. But certain exercises can keep the joints limber.  A surgical procedure may be needed when there are swallowing disturbances. It is called a gastrostomy. This surgery involves the placement of a tube through the skin of the abdomen into the stomach (intestine) for feeding purposes. Document Released: 04/28/2002 Document Revised: 07/31/2011 Document Reviewed: 05/08/2005 [employer] Patient Information 2014 [employer].  

## 2013-10-24 ENCOUNTER — Telehealth: Payer: Self-pay | Admitting: Neurology

## 2013-10-24 MED ORDER — GABAPENTIN (ONCE-DAILY) 600 MG PO TABS: 1200.0000 mg | ORAL_TABLET | Freq: Every day | ORAL | Status: DC

## 2013-10-24 NOTE — Telephone Encounter (Signed)
Spouse calling requesting refill for Gabapentin, PHN, (GRALISE) 600 MG TABS.  Stated Rx should be sent to [facility], 90 day supply.

## 2013-10-24 NOTE — Telephone Encounter (Signed)
Rx has been sent to [facility] for 90 day supply 

## 2013-10-29 NOTE — Telephone Encounter (Signed)
Called [facility], spoke with Thurmond Butts to clarify pt's medication, which is Gabapentin 600 mg, twice a day at night, per Dr. Brett Fairy. I advised Thurmond Butts that if they need anything else to give our office a call. Ryan verbalized understanding.

## 2013-11-10 ENCOUNTER — Telehealth: Payer: Self-pay | Admitting: Neurology

## 2013-11-10 NOTE — Telephone Encounter (Signed)
FYI--Patient states Matthew [name] will be contacting our office for refill on Gabapentin.

## 2013-11-10 NOTE — Telephone Encounter (Signed)
By viewing chart, this Rx was already sent to [facility].

## 2013-11-11 ENCOUNTER — Telehealth: Payer: Self-pay | Admitting: Neurology

## 2013-11-11 NOTE — Telephone Encounter (Signed)
Rx was already sent on 06/05.  I called back.  Spoke with Randall Hiss.  He then transferred me to [facility].  They verified they did get the Rx we sent to them on 06/05.  They will proceed with refill and call us back if anything further is needed.

## 2013-11-11 NOTE — Telephone Encounter (Signed)
Leanna Sato from [facility] calling to request Gabapentin script for patient, 600 mg, 90 day supply. Please return call and advise.

## 2013-11-30 ENCOUNTER — Telehealth: Payer: Self-pay

## 2013-11-30 NOTE — Telephone Encounter (Signed)
[employer] sent [place] a letter saying they have approved our request for coverage on Gralise effective until 05/21/2014 Ref ID # [id number]

## 2013-12-05 ENCOUNTER — Telehealth: Payer: Self-pay | Admitting: *Deleted

## 2013-12-05 MED ORDER — GABAPENTIN 600 MG PO TABS: 600.0000 mg | ORAL_TABLET | Freq: Two times a day (BID) | ORAL | Status: DC

## 2013-12-05 NOTE — Telephone Encounter (Signed)
Spouse called with [facility]'s direct # [phone].

## 2013-12-05 NOTE — Telephone Encounter (Signed)
This Rx was already sent on 06/05.  We spoke with an agent from [facility] on 6/23 and they confirmed they did receive the Rx.  I called them again at [phone].  Spoke with Pam.  She verified they do have the Rx on file for this patient.  Says the order is pending the patient's return call to schedule delivery/set up payment.  Says the patient needs to call them at [phone] to take care of this.  I called the patient back.  Got no answer.  Left message.

## 2013-12-05 NOTE — Telephone Encounter (Signed)
Ms Matthew [name] called back and said they would like to request a drug change from Gralise to Gabapentin.  With their new ins plan, Gabapentin will be $0.  Please advise.  Thank you.

## 2013-12-14 ENCOUNTER — Ambulatory Visit (INDEPENDENT_AMBULATORY_CARE_PROVIDER_SITE_OTHER): Payer: Commercial Managed Care - HMO

## 2013-12-14 DIAGNOSIS — G252 Other specified forms of tremor: Secondary | ICD-10-CM

## 2013-12-14 DIAGNOSIS — R488 Other symbolic dysfunctions: Secondary | ICD-10-CM

## 2013-12-14 DIAGNOSIS — G4733 Obstructive sleep apnea (adult) (pediatric): Secondary | ICD-10-CM

## 2013-12-14 DIAGNOSIS — G4701 Insomnia due to medical condition: Secondary | ICD-10-CM

## 2013-12-14 DIAGNOSIS — R0902 Hypoxemia: Secondary | ICD-10-CM

## 2013-12-14 DIAGNOSIS — G4723 Circadian rhythm sleep disorder, irregular sleep wake type: Secondary | ICD-10-CM

## 2013-12-31 NOTE — Sleep Study (Signed)
See media tab for full report  

## 2014-01-07 ENCOUNTER — Other Ambulatory Visit: Payer: Self-pay | Admitting: *Deleted

## 2014-01-07 DIAGNOSIS — G4733 Obstructive sleep apnea (adult) (pediatric): Secondary | ICD-10-CM

## 2014-01-21 ENCOUNTER — Telehealth: Payer: Self-pay | Admitting: Neurology

## 2014-01-21 NOTE — Telephone Encounter (Signed)
Called and tried to leave a message for this patient about his recent sleep study results. I will try to call patient with results.

## 2014-01-22 ENCOUNTER — Encounter: Payer: Self-pay | Admitting: Neurology

## 2014-01-22 ENCOUNTER — Ambulatory Visit (INDEPENDENT_AMBULATORY_CARE_PROVIDER_SITE_OTHER): Payer: Commercial Managed Care - HMO | Admitting: Neurology

## 2014-01-22 ENCOUNTER — Institutional Professional Consult (permissible substitution): Payer: Medicare Other | Admitting: Neurology

## 2014-01-22 ENCOUNTER — Encounter: Payer: Self-pay | Admitting: *Deleted

## 2014-01-22 VITALS — BP 95/59 | HR 78 | Temp 98.6°F | Ht 67.0 in | Wt 180.0 lb

## 2014-01-22 DIAGNOSIS — G4733 Obstructive sleep apnea (adult) (pediatric): Secondary | ICD-10-CM

## 2014-01-22 DIAGNOSIS — F329 Major depressive disorder, single episode, unspecified: Secondary | ICD-10-CM

## 2014-01-22 DIAGNOSIS — R269 Unspecified abnormalities of gait and mobility: Secondary | ICD-10-CM

## 2014-01-22 DIAGNOSIS — R2689 Other abnormalities of gait and mobility: Secondary | ICD-10-CM

## 2014-01-22 DIAGNOSIS — F3289 Other specified depressive episodes: Secondary | ICD-10-CM

## 2014-01-22 DIAGNOSIS — G4701 Insomnia due to medical condition: Secondary | ICD-10-CM

## 2014-01-22 DIAGNOSIS — F32A Depression, unspecified: Secondary | ICD-10-CM

## 2014-01-22 NOTE — Telephone Encounter (Signed)
I tried calling the patient to inform him of his sleep study results. I was unable to reach the patient, so I will send the report to the patient along with follow up instructions.

## 2014-01-22 NOTE — Patient Instructions (Signed)
The sleep lab will get in touch with you.  I will recommend to Dr. Brett Fairy: stop Mirapex and referral to physical therapy. Do MRI brain.   Please reduce your caffeine intake and do not take more medicine than prescribed, drink more water, use your CPAP and use your cane.   I do not believe you have parkinson's disease.   Follow up with Dr. Brett Fairy about 8 weeks after starting CPAP.

## 2014-01-22 NOTE — Progress Notes (Signed)
Subjective:    Patient ID: Matthew [name] is a 79 y.o. male.  HPI    Star Age, MD, PhD [facility] [address] [place], [place] [address]  Dear Matthew [name],    I saw your patient, Matthew [name], upon your kind request in my clinic today for second opinion of his gait disorder, concern for atypical parkinsonism. The patient is accompanied by his wife today. As you know, Matthew [name] is a 78 year old right-handed gentleman with an underlying medical history of hyperlipidemia, type 2 diabetes, depression, insomnia, who has had problems with tremors, gait disorder and recurrent falls for the past 5 years, gradual in onset and progressive with time. He had snoring and poor sleep consolidation and had witnessed breathing pauses while asleep and you suggested a sleep study. He he had a split-night sleep study on 12/14/2013 which you interpreted. His baseline sleep efficiency was 61.3%. His AHI was 19.2 per hour, oxyhemoglobin desaturation nadir was 81%. He was started on CPAP at 5 cm and titrated to 6 cm. His sleep efficiency was 69.8%. I explained the sleep study findings to him and his wife.  He drinks up to 20 cups of coffee per day! You have asked him to reduce it and you have reduced his Mirapex. He has a tendency for overeating sweets. He had a problem with alcohol in the past and enrolled himself in [place]. He has R knee pain and has been using a brace for 5 years. He is to see Dr. Maureen Ralphs. He has been taking gabapentin 600 mg bid, paxil 20 mg, mirapex 0.25 mg, temazepam 15 mg qHS and ativan 0.5 mg qHS, Ambien 10 mg. He fell this AM around 1 AM in the hallway on carpet, no head injury, no LOC. Of note, he took more gabapentin and ativan than prescribed, in fact: he took 1200 mg of gabapentin, 3 pills of Ativan, one pill of temazepam and one pill of Ambien. His wife indicates that he sometimes takes more than prescribed. He still has trouble falling asleep  and he has anxiety at night because he cannot fall asleep. Sometimes he sits in a recliner and does a fair. He has not yet been set up with CPAP and is awaiting this. He has had a tremor in both hands for months.   His Past Medical History Is Significant For: Past Medical History  Diagnosis Date  . Depression   . Diabetes mellitus   . Hyperlipidemia   . Parkinson's disease   . Memory loss     His Past Surgical History Is Significant For: Past Surgical History  Procedure Laterality Date  . Tonsillectomy  1944  . Cataract extraction w/ intraocular lens  implant, bilateral  2013    bilateral    His Family History Is Significant For: Family History  Problem Relation Age of Onset  . Colon cancer Neg Hx   . Stomach cancer Neg Hx   . Dementia Mother     His Social History Is Significant For: History   Social History  . Marital Status: Married    Spouse Name: Matthew [name]    Number of Children: 1  . Years of Education: 23   Social History Main Topics  . Smoking status: Former Smoker    Types: Cigarettes    Quit date: 03/14/1974  . Smokeless tobacco: Never Used  . Alcohol Use: No  . Drug Use: No  . Sexual Activity: None   Other Topics Concern  .  None   Social History Narrative   Patient is married Personal assistant).   Patient has one son.   Patient is retired.   Patient has a high school education.   Patient is left-handed.   Patient drinks about 20 cups of coffee daily.    His Allergies Are:  No Known Allergies:   His Current Medications Are:  Outpatient Encounter Prescriptions as of 01/22/2014  Medication Sig  . ACCU-CHEK COMPACT PLUS test strip daily.  Marland Kitchen ACCU-CHEK SOFTCLIX LANCETS lancets daily.  Marland Kitchen atorvastatin (LIPITOR) 40 MG tablet Take 40 mg by mouth daily.  Marland Kitchen gabapentin (NEURONTIN) 600 MG tablet Take 1 tablet (600 mg total) by mouth 2 (two) times daily.  Marland Kitchen glimepiride (AMARYL) 2 MG tablet Take 2 mg by mouth daily before breakfast.  . INVOKANA 300 MG TABS 1 tablet  daily.  Marland Kitchen LORazepam (ATIVAN) 0.5 MG tablet 1 tablet at bedtime.  Marland Kitchen PARoxetine (PAXIL) 20 MG tablet Take 1 tablet (20 mg total) by mouth Nightly.  . pramipexole (MIRAPEX) 0.25 MG tablet Take 1 tablet (0.25 mg total) by mouth as directed.  . temazepam (RESTORIL) 15 MG capsule as needed.  . zolpidem (AMBIEN) 10 MG tablet Take 10 mg by mouth at bedtime as needed.  . [DISCONTINUED] Gabapentin, PHN, (GRALISE) 600 MG TABS Take 1,200 mg by mouth daily.  . [DISCONTINUED] Linagliptin-Metformin HCl (JENTADUETO) 2.5-500 MG TABS Take by mouth 2 (two) times daily.  :   Review of Systems:  Out of a complete 14 point review of systems, all are reviewed and negative with the exception of these symptoms as listed below:   Review of Systems  Eyes: Positive for pain and itching.    Objective:  Neurologic Exam  Physical Exam Physical Examination:   Filed Vitals:   01/22/14 1407  BP: 95/59  Pulse: 78  Temp: 98.6 F (37 C)   Upon recheck, he had a sitting blood pressure of 95/60 with a pulse of 80 and no significant orthostatic changes upon standing.  General Examination: The patient is a very pleasant 79 y.o. male in no acute distress. He appears well-developed and well-nourished and well groomed. He c/o R knee pain.   HEENT: Normocephalic, atraumatic, pupils are equal, round and reactive to light and accommodation. Funduscopic exam is normal with sharp disc margins noted. Extraocular tracking is good without limitation to gaze excursion or nystagmus noted. Normal smooth pursuit is noted. Hearing is grossly intact. Face is symmetric with normal facial animation and normal facial sensation. Speech is clear with no dysarthria noted. There is no hypophonia. There is no lip, neck/head, jaw or voice tremor. Neck is supple with full range of passive and active motion. There are no carotid bruits on auscultation. Oropharynx exam reveals: moderate mouth dryness, adequate dental hygiene and moderate airway  crowding. Mallampati is class II. Tongue protrudes centrally and palate elevates symmetrically.   Chest: Clear to auscultation without wheezing, rhonchi or crackles noted.  Heart: S1+S2+0, regular and normal without murmurs, rubs or gallops noted.   Abdomen: Soft, non-tender and non-distended with normal bowel sounds appreciated on auscultation.  Extremities: There is no pitting edema in the distal lower extremities bilaterally. Pedal pulses are intact.  Skin: Warm and dry without trophic changes noted. There are no varicose veins.  Musculoskeletal: exam reveals: Right knee pain and he is wearing a knee brace which I did not have him take off.  Neurologically:  Mental status: The patient is awake, alert and oriented in all 4 spheres. His immediate  and remote memory, attention, language skills and fund of knowledge are mildly impaired. There is no evidence of aphasia, agnosia, apraxia or anomia. Speech is clear with normal prosody and enunciation. Thought process is linear. Mood is normal and affect is blunted.  Cranial nerves II - XII are as described above under HEENT exam. In addition: shoulder shrug is normal with equal shoulder height noted. Motor exam: Normal bulk, strength and tone is noted. There is mild, intermittent, fast and low amplitude resting, postural and action tremor in both upper extremities. Romberg is not tested. Reflexes are 1+ in the upper extremities and trace in both knees. He has tenderness in the right knee. Fine motor skills and  coordination: mildly impaired bilaterally with no significant lateralization.   Cerebellar testing: No dysmetria or intention tremor on finger to nose testing. Heel to shin is  not possible. there is no truncal ataxia.  Sensory exam: intact to light touch, pinprick, vibration, temperature sense  in the upper extremities but decrease to pinprick and vibration sense in the distal lower extremities bilaterally. Gait, station and balance: He stands  with difficulty and has to push himself up. His posture is abnormal and band in both knees right more than left and he has increased lumbar lordosis. His posture is not typically Parkinsonian with a stoop more crooked. He does tend to lean a little bit to the right.he walks with his walking cane. He is quite insecure and while he does not have a parkinsonian shuffle, he does not pick up his feet well. He is insecure with turning.   Assessment and Plan:   In summary, MINORU [name] is a very pleasant 79 y.o.-year old male with an underlying medical history of hyperlipidemia, type 2 diabetes, depression, insomnia, who has had problems with tremors, gait disorder and recurrent falls for the past 5 years, gradual in onset and progressive with time.  he has had multiple falls and in fact fell last night. Thankfully he did not injure himself. I think we are dealing with a multifactorial gait disorder. Contributors are advanced age, arthritis particularly of the right knee, evidence of mild neuropathy, and prior history of excessive alcohol use as well as medication effect. He is strongly advised not to take more medication than prescribed. Of note, he is taking multiple sedating medications and is strongly advised not to take more than prescribed. In addition, he has a tremor which is bilateral and confined to the upper extremities but not typically Parkinsonian. He is on low-dose Mirapex and I would recommend that he discontinue this. I would recommend that you consider physical therapy for gait and balance training and consider an MRI brain. He was recently diagnosed with obstructive sleep apnea and will be set up with CPAP therapy and I suggested that he followup with you approximately 6-8 weeks after being set up with CPAP at home. He follows with Dr. Casimiro Needle for his depression. I explained to him that he does not look like a typical Parkinson's patient. He was not orthostatic on examination. He has an  appointment with orthopedics soon for evaluation of his right knee. I suggested he discuss with you the MRI brain, physical therapy referral and CPAP treatment. I do not need to see him back on a scheduled basis and advised him to followup with you as previously planned. His upper extremity prepped tremor may be related to his excessive caffeine intake. I advised him to reduce his caffeine intake and increase his water intake. I answered  all their questions and the patient and his wife were in agreement. Most of my 45 minute visit was spent in counseling and coordination of care and reviewing test results.   Thank you very much for allowing me to participate in the care of this nice patient. If I can be of any further assistance to you please do not hesitate to call me at [phone].  Sincerely,   Star Age, MD, PhD

## 2014-02-02 ENCOUNTER — Telehealth: Payer: Self-pay | Admitting: Neurology

## 2014-02-02 DIAGNOSIS — R0902 Hypoxemia: Secondary | ICD-10-CM

## 2014-02-02 NOTE — Telephone Encounter (Signed)
Dr. Rexene Alberts, this message was routed to you in error.  It looks as though you may be seeing him for his Parkinson's only and not osa.  This is for Dr. Brett Fairy.  Dr. Brett Fairy, please see below.  Patient will need a qualifying test for O2 that is less than 83 days old.  Would you like to order an ONO?

## 2014-02-02 NOTE — Telephone Encounter (Signed)
Pt's recent sleep study will not qualify him for O2 because the test used to justify need is over 16 days old.  Would you like to order an ONO to see if he will qualify for O2?

## 2014-02-03 NOTE — Telephone Encounter (Signed)
Ordered ONO for possible need of oxygen, is that still the medicare way to proof need or is there more documentation needed. CD

## 2014-05-27 DIAGNOSIS — G4733 Obstructive sleep apnea (adult) (pediatric): Secondary | ICD-10-CM | POA: Diagnosis not present

## 2014-06-01 DIAGNOSIS — I10 Essential (primary) hypertension: Secondary | ICD-10-CM | POA: Diagnosis not present

## 2014-06-01 DIAGNOSIS — E785 Hyperlipidemia, unspecified: Secondary | ICD-10-CM | POA: Diagnosis not present

## 2014-06-01 DIAGNOSIS — E119 Type 2 diabetes mellitus without complications: Secondary | ICD-10-CM | POA: Diagnosis not present

## 2014-06-01 DIAGNOSIS — Z125 Encounter for screening for malignant neoplasm of prostate: Secondary | ICD-10-CM | POA: Diagnosis not present

## 2014-06-08 DIAGNOSIS — Z0000 Encounter for general adult medical examination without abnormal findings: Secondary | ICD-10-CM | POA: Diagnosis not present

## 2014-06-08 DIAGNOSIS — E119 Type 2 diabetes mellitus without complications: Secondary | ICD-10-CM | POA: Diagnosis not present

## 2014-06-08 DIAGNOSIS — E785 Hyperlipidemia, unspecified: Secondary | ICD-10-CM | POA: Diagnosis not present

## 2014-06-08 DIAGNOSIS — Z5181 Encounter for therapeutic drug level monitoring: Secondary | ICD-10-CM | POA: Diagnosis not present

## 2014-06-08 DIAGNOSIS — I10 Essential (primary) hypertension: Secondary | ICD-10-CM | POA: Diagnosis not present

## 2014-06-08 DIAGNOSIS — G20 Parkinson's disease: Secondary | ICD-10-CM | POA: Diagnosis not present

## 2014-06-08 DIAGNOSIS — Z1389 Encounter for screening for other disorder: Secondary | ICD-10-CM | POA: Diagnosis not present

## 2014-06-08 DIAGNOSIS — F419 Anxiety disorder, unspecified: Secondary | ICD-10-CM | POA: Diagnosis not present

## 2014-06-10 ENCOUNTER — Emergency Department (HOSPITAL_COMMUNITY): Payer: Commercial Managed Care - HMO

## 2014-06-10 ENCOUNTER — Emergency Department (HOSPITAL_COMMUNITY)
Admission: EM | Admit: 2014-06-10 | Discharge: 2014-06-11 | Disposition: A | Discharge status: Home / Self Care | Payer: Commercial Managed Care - HMO | Attending: Emergency Medicine | Admitting: Emergency Medicine

## 2014-06-10 ENCOUNTER — Encounter (HOSPITAL_COMMUNITY): Payer: Self-pay | Admitting: *Deleted

## 2014-06-10 ENCOUNTER — Other Ambulatory Visit: Payer: Self-pay

## 2014-06-10 DIAGNOSIS — F329 Major depressive disorder, single episode, unspecified: Secondary | ICD-10-CM | POA: Insufficient documentation

## 2014-06-10 DIAGNOSIS — R079 Chest pain, unspecified: Secondary | ICD-10-CM | POA: Diagnosis present

## 2014-06-10 DIAGNOSIS — R0789 Other chest pain: Secondary | ICD-10-CM

## 2014-06-10 DIAGNOSIS — Z79899 Other long term (current) drug therapy: Secondary | ICD-10-CM | POA: Insufficient documentation

## 2014-06-10 DIAGNOSIS — E119 Type 2 diabetes mellitus without complications: Secondary | ICD-10-CM | POA: Insufficient documentation

## 2014-06-10 DIAGNOSIS — E785 Hyperlipidemia, unspecified: Secondary | ICD-10-CM | POA: Insufficient documentation

## 2014-06-10 DIAGNOSIS — Z87891 Personal history of nicotine dependence: Secondary | ICD-10-CM | POA: Diagnosis not present

## 2014-06-10 DIAGNOSIS — J984 Other disorders of lung: Secondary | ICD-10-CM | POA: Diagnosis not present

## 2014-06-10 DIAGNOSIS — G20 Parkinson's disease: Secondary | ICD-10-CM | POA: Diagnosis not present

## 2014-06-10 LAB — CBC
HEMATOCRIT: 44.6 % (ref 39.0–52.0)
Hemoglobin: 15.5 g/dL (ref 13.0–17.0)
MCH: 32.8 pg (ref 26.0–34.0)
MCHC: 34.8 g/dL (ref 30.0–36.0)
MCV: 94.5 fL (ref 78.0–100.0)
PLATELETS: 175 10*3/uL (ref 150–400)
RBC: 4.72 MIL/uL (ref 4.22–5.81)
RDW: 13.4 % (ref 11.5–15.5)
WBC: 6.9 10*3/uL (ref 4.0–10.5)

## 2014-06-10 LAB — BASIC METABOLIC PANEL
Anion gap: 6 (ref 5–15)
BUN: 20 mg/dL (ref 6–23)
CALCIUM: 9.3 mg/dL (ref 8.4–10.5)
CO2: 34 mmol/L — ABNORMAL HIGH (ref 19–32)
Chloride: 102 mEq/L (ref 96–112)
Creatinine, Ser: 1.12 mg/dL (ref 0.50–1.35)
GFR calc Af Amer: 71 mL/min — ABNORMAL LOW (ref >90)
GFR, EST NON AFRICAN AMERICAN: 61 mL/min — AB (ref >90)
Glucose, Bld: 176 mg/dL — ABNORMAL HIGH (ref 70–99)
Potassium: 4.3 mmol/L (ref 3.5–5.1)
Sodium: 142 mmol/L (ref 135–145)

## 2014-06-10 LAB — I-STAT TROPONIN, ED
Troponin i, poc: 0.01 ng/mL (ref 0.00–0.08)
Troponin i, poc: 0 ng/mL (ref 0.00–0.08)

## 2014-06-10 MED ORDER — ASPIRIN 81 MG PO CHEW: 324.0000 mg | CHEWABLE_TABLET | Freq: Once | ORAL | Status: DC

## 2014-06-10 NOTE — Discharge Instructions (Signed)
You will be contacted by the [facility] group for an outpatient stress test. Your caregiver has diagnosed you as having chest pain that is not specific for one problem, but does not require admission.  You are at low risk for an acute heart condition or other serious illness. Chest pain comes from many different causes.  SEEK IMMEDIATE MEDICAL ATTENTION IF: You have severe chest pain, especially if the pain is crushing or pressure-like and spreads to the arms, back, neck, or jaw, or if you have sweating, nausea (feeling sick to your stomach), or shortness of breath. THIS IS AN EMERGENCY. Don't wait to see if the pain will go away. Get medical help at once. Call 911 or 0 (operator). DO NOT drive yourself to the hospital.  Your chest pain gets worse and does not go away with rest.  You have an attack of chest pain lasting longer than usual, despite rest and treatment with the medications your caregiver has prescribed.  You wake from sleep with chest pain or shortness of breath.  You feel dizzy or faint.  You have chest pain not typical of your usual pain for which you originally saw your caregiver.  . Chest Wall Pain Chest wall pain is pain in or around the bones and muscles of your chest. It may take up to 6 weeks to get better. It may take longer if you must stay physically active in your work and activities.  CAUSES  Chest wall pain may happen on its own. However, it may be caused by:  A viral illness like the flu.  Injury.  Coughing.  Exercise.  Arthritis.  Fibromyalgia.  Shingles. HOME CARE INSTRUCTIONS   Avoid overtiring physical activity. Try not to strain or perform activities that cause pain. This includes any activities using your chest or your abdominal and side muscles, especially if heavy weights are used.  Put ice on the sore area.  Put ice in a plastic bag.  Place a towel between your skin and the bag.  Leave the ice on for 15-20 minutes per hour while awake  for the first 2 days.  Only take over-the-counter or prescription medicines for pain, discomfort, or fever as directed by your caregiver. SEEK IMMEDIATE MEDICAL CARE IF:   Your pain increases, or you are very uncomfortable.  You have a fever.  Your chest pain becomes worse.  You have new, unexplained symptoms.  You have nausea or vomiting.  You feel sweaty or lightheaded.  You have a cough with phlegm (sputum), or you cough up blood. MAKE SURE YOU:   Understand these instructions.  Will watch your condition.  Will get help right away if you are not doing well or get worse. Document Released: 05/08/2005 Document Revised: 07/31/2011 Document Reviewed: 01/02/2011 [facility] Patient Information 2015 [place], [place]. This information is not intended to replace advice given to you by your health care provider. Make sure you discuss any questions you have with your health care provider.

## 2014-06-10 NOTE — ED Provider Notes (Signed)
CSN: [id number]     Arrival date & time 06/10/14  2036 History   First MD Initiated Contact with Patient 06/10/14 2044     Chief Complaint  Patient presents with  . Chest Pain     (Consider location/radiation/quality/duration/timing/severity/associated sxs/prior Treatment) HPI   Matthew [name] is a(n) 80 y.o. male who presents to the ED with Cc of L sided chest pain. He has a pmh of DM, hyperlipidemia, parkinson's. He has a 40 pack year smoking history and a family hisory of brother with early MI at age 28. The patient states that he noticed fleeting left sided chest pain that occurred while he was walking down the hall today. He states that it felt like a muscle cramp on the inside of his chest. He denies SOB, nausea, diaphoresis. He states that  He has had this sensation before and it only lasts seconds at a time. He states that it usually goes away, but tonight it continued to recur lasting seconds at a time and is unprovoked. Pain is non radiating, not associated with exertion or rest, non pleuritic.   Past Medical History  Diagnosis Date  . Depression   . Diabetes mellitus   . Hyperlipidemia   . Parkinson's disease   . Memory loss    Past Surgical History  Procedure Laterality Date  . Tonsillectomy  1944  . Cataract extraction w/ intraocular lens  implant, bilateral  2013    bilateral   Family History  Problem Relation Age of Onset  . Colon cancer Neg Hx   . Stomach cancer Neg Hx   . Dementia Mother    History  Substance Use Topics  . Smoking status: Former Smoker    Types: Cigarettes    Quit date: 03/14/1974  . Smokeless tobacco: Never Used  . Alcohol Use: No    Review of Systems  Ten systems reviewed and are negative for acute change, except as noted in the HPI.    Allergies  Review of patient's allergies indicates no known allergies.  Home Medications   Prior to Admission medications   Medication Sig Start Date End Date Taking? Authorizing Provider   ACCU-CHEK COMPACT PLUS test strip daily. 10/01/13   Historical Provider, MD  ACCU-CHEK SOFTCLIX LANCETS lancets daily. 09/08/13   Historical Provider, MD  atorvastatin (LIPITOR) 40 MG tablet Take 40 mg by mouth daily.    Historical Provider, MD  gabapentin (NEURONTIN) 600 MG tablet Take 1 tablet (600 mg total) by mouth 2 (two) times daily. 12/05/13   Carmen Dohmeier, MD  glimepiride (AMARYL) 2 MG tablet Take 2 mg by mouth daily before breakfast.    Historical Provider, MD  INVOKANA 300 MG TABS 1 tablet daily. 10/02/13   Historical Provider, MD  JENTADUETO 2.5-500 MG TABS  10/27/13   Historical Provider, MD  LORazepam (ATIVAN) 0.5 MG tablet 1 tablet at bedtime. 07/31/13   Historical Provider, MD  PARoxetine (PAXIL) 20 MG tablet Take 1 tablet (20 mg total) by mouth Nightly. 10/21/13   Asencion Partridge Dohmeier, MD  pramipexole (MIRAPEX) 0.25 MG tablet Take 1 tablet (0.25 mg total) by mouth as directed. 10/21/13   Asencion Partridge Dohmeier, MD  temazepam (RESTORIL) 15 MG capsule as needed. 07/28/13   Historical Provider, MD  zolpidem (AMBIEN) 10 MG tablet Take 10 mg by mouth at bedtime as needed.    Historical Provider, MD   BP 138/60 mmHg  Pulse 81  Temp(Src) 97.9 F (36.6 C) (Oral)  Resp 18  Ht 5\' 7"  (1.702  m)  SpO2 96% Physical Exam  Constitutional: He appears well-developed and well-nourished. No distress.  HENT:  Head: Normocephalic and atraumatic.  Eyes: Conjunctivae are normal. No scleral icterus.  Neck: Normal range of motion. Neck supple. No JVD present.  Cardiovascular: Normal rate and regular rhythm.  Exam reveals no gallop and no friction rub.   No murmur heard. Pulmonary/Chest: Effort normal and breath sounds normal. No respiratory distress. He has no wheezes. He exhibits no tenderness.  Abdominal: Soft. Bowel sounds are normal. He exhibits no distension and no mass. There is no tenderness. There is no guarding.  Musculoskeletal: He exhibits no edema.  Neurological: He is alert.  Skin: Skin is warm and  dry. He is not diaphoretic.  Psychiatric: His behavior is normal.  Nursing note and vitals reviewed.   ED Course  Procedures (including critical care time) Labs Review Labs Reviewed  BASIC METABOLIC PANEL  [place], ED    Imaging Review No results found.   EKG Interpretation   Date/Time:  Wednesday June 10 2014 20:40:44 EST Ventricular Rate:  83 PR Interval:  172 QRS Duration: 76 QT Interval:  348 QTC Calculation: 408 R Axis:   57 Text Interpretation:  Sinus rhythm with Premature atrial complexes Low  voltage QRS Borderline ECG No old tracing to compare Confirmed by  Winfred Leeds  MD, SAM [id number]) on 06/10/2014 9:20:50 PM       Date: 06/10/2014  Rate: 83  Rhythm: normal sinus rhythm  QRS Axis: normal  Intervals: normal  ST/T Wave abnormalities: normal  Conduction Disutrbances:none  Narrative Interpretation:   Old EKG Reviewed:    MDM   Final diagnoses:  Atypical chest pain    9:12 PM BP 138/60 mmHg  Pulse 81  Temp(Src) 97.9 F (36.6 C) (Oral)  Resp 18  Ht 5\' 7"  (1.702 m)  SpO2 96% Patient with fleeting chest pain. He has a moderate amount of risk factors. He is cp free at this time. He took 2 adult sized asa today.    10:11 PM I spoke with Dr.  Abner Greenspan on call for [facility] neuro  Who asks for Cardiology consult. I spoke with Dr. Aundra Dubin and discussed the case. Although the patient has a Heart score of 4 due to RF and Age. His pain is very atypical. We will get a second troponin, If negative the patient will be set up for an op stress test.  12:38 AM BP 121/73 mmHg  Pulse 73  Temp(Src) 97.9 F (36.6 C) (Oral)  Resp 17  Ht 5\' 7"  (1.702 m)  SpO2 99% Patient is much better after pain meds. I discussed the imaging findings. She will be discharged with supportive care. F/U with her pcp. The patient appears reasonably screened and/or stabilized for discharge and I doubt any other medical condition or other [facility] requiring further  screening, evaluation, or treatment in the ED at this time prior to discharge.   Margarita Mail, PA-C 06/11/14 [place], MD 06/11/14 [phone]

## 2014-06-10 NOTE — ED Notes (Signed)
The pt has had lt upper chest pain today.  Minimal pain at present.  He had a physical yesterday and everything was ok

## 2014-06-10 NOTE — ED Provider Notes (Signed)
Complains of left-sided chest pain onset today lasting a split second, nonradiating no other associated symptoms no shortness of breath no nausea no sweatiness pain is not made better or worse by anything presently asymptomatic. On exam patient is alert no distress lungs clear auscultation heart regular rate and rhythm. A stop on risk factors and age heart score equals 4. Outpatient cardiology workup arranged. Patient is in agreement   Orlie Dakin, MD 06/10/14 2210

## 2014-06-27 DIAGNOSIS — G4733 Obstructive sleep apnea (adult) (pediatric): Secondary | ICD-10-CM | POA: Diagnosis not present

## 2014-07-26 DIAGNOSIS — G4733 Obstructive sleep apnea (adult) (pediatric): Secondary | ICD-10-CM | POA: Diagnosis not present

## 2014-07-31 ENCOUNTER — Other Ambulatory Visit: Payer: Self-pay | Admitting: Neurology

## 2014-08-13 ENCOUNTER — Telehealth: Payer: Self-pay | Admitting: Neurology

## 2014-08-13 NOTE — Telephone Encounter (Signed)
Patient is requesting a new Rx for Mirapex taking two tabs daily instead of one.  Please advise.  Thank you.

## 2014-08-13 NOTE — Telephone Encounter (Signed)
Patient spouse requesting refill for Rx pramipexole (MIRAPEX) 0.25 MG tablet with instructions of 2 tabs a day as instructed by Dr. Brett Fairy.  Please forward to [facility] on [address]  [place].  Please call and advise.

## 2014-08-14 MED ORDER — PRAMIPEXOLE DIHYDROCHLORIDE 0.25 MG PO TABS: 0.2500 mg | ORAL_TABLET | Freq: Two times a day (BID) | ORAL | Status: DC

## 2014-08-22 ENCOUNTER — Other Ambulatory Visit: Payer: Self-pay | Admitting: Neurology

## 2014-08-26 DIAGNOSIS — G4733 Obstructive sleep apnea (adult) (pediatric): Secondary | ICD-10-CM | POA: Diagnosis not present

## 2014-09-22 ENCOUNTER — Telehealth: Payer: Self-pay | Admitting: Neurology

## 2014-09-22 DIAGNOSIS — R269 Unspecified abnormalities of gait and mobility: Secondary | ICD-10-CM

## 2014-09-22 NOTE — Telephone Encounter (Signed)
Hilda,pt's wife called wanting to follow up on a referral Dr. Rexene Alberts was going to send for patient to have physical therapy. Please call and advice # [phone]

## 2014-09-23 NOTE — Telephone Encounter (Signed)
Correct me if I am wrong, is this a Dr. Brett Fairy patient? And I think your last note suggested that he speak with Dr. Brett Fairy about PT?

## 2014-09-23 NOTE — Telephone Encounter (Signed)
Forwarding to Dr. Keturah Barre.

## 2014-09-25 ENCOUNTER — Telehealth: Payer: Self-pay

## 2014-09-25 DIAGNOSIS — G4733 Obstructive sleep apnea (adult) (pediatric): Secondary | ICD-10-CM | POA: Diagnosis not present

## 2014-09-25 NOTE — Telephone Encounter (Signed)
Called pt to inform them that referral was placed today for PT by Dr. Brett Fairy and they should be hearing about making an appt with them soon.

## 2014-10-06 ENCOUNTER — Ambulatory Visit: Payer: Medicare Other | Admitting: Physical Therapy

## 2014-10-07 DIAGNOSIS — M25561 Pain in right knee: Secondary | ICD-10-CM | POA: Diagnosis not present

## 2014-10-07 DIAGNOSIS — E119 Type 2 diabetes mellitus without complications: Secondary | ICD-10-CM | POA: Diagnosis not present

## 2014-10-07 DIAGNOSIS — F419 Anxiety disorder, unspecified: Secondary | ICD-10-CM | POA: Diagnosis not present

## 2014-10-07 DIAGNOSIS — E785 Hyperlipidemia, unspecified: Secondary | ICD-10-CM | POA: Diagnosis not present

## 2014-10-07 DIAGNOSIS — I10 Essential (primary) hypertension: Secondary | ICD-10-CM | POA: Diagnosis not present

## 2014-10-07 DIAGNOSIS — G20 Parkinson's disease: Secondary | ICD-10-CM | POA: Diagnosis not present

## 2014-10-07 DIAGNOSIS — Z5181 Encounter for therapeutic drug level monitoring: Secondary | ICD-10-CM | POA: Diagnosis not present

## 2014-10-07 DIAGNOSIS — Z1389 Encounter for screening for other disorder: Secondary | ICD-10-CM | POA: Diagnosis not present

## 2014-10-13 DIAGNOSIS — E119 Type 2 diabetes mellitus without complications: Secondary | ICD-10-CM | POA: Diagnosis not present

## 2014-10-26 DIAGNOSIS — G4733 Obstructive sleep apnea (adult) (pediatric): Secondary | ICD-10-CM | POA: Diagnosis not present

## 2014-10-27 DIAGNOSIS — E119 Type 2 diabetes mellitus without complications: Secondary | ICD-10-CM | POA: Diagnosis not present

## 2014-10-27 DIAGNOSIS — I10 Essential (primary) hypertension: Secondary | ICD-10-CM | POA: Diagnosis not present

## 2014-11-03 ENCOUNTER — Encounter: Payer: Self-pay | Admitting: Physical Therapy

## 2014-11-03 ENCOUNTER — Ambulatory Visit: Payer: Commercial Managed Care - HMO | Attending: Neurology | Admitting: Physical Therapy

## 2014-11-03 DIAGNOSIS — R29818 Other symptoms and signs involving the nervous system: Secondary | ICD-10-CM | POA: Insufficient documentation

## 2014-11-03 DIAGNOSIS — R269 Unspecified abnormalities of gait and mobility: Secondary | ICD-10-CM | POA: Insufficient documentation

## 2014-11-03 DIAGNOSIS — R2689 Other abnormalities of gait and mobility: Secondary | ICD-10-CM

## 2014-11-04 ENCOUNTER — Encounter: Payer: Self-pay | Admitting: Physical Therapy

## 2014-11-04 NOTE — Therapy (Signed)
[facility] [address] [place], [place], [address] Phone: [phone]   Fax:  [phone]  Physical Therapy Evaluation  Patient Details  Name: Matthew [name] MRN: [id number] Date of Birth: 05-20-1935 Referring Provider:  Larey Seat, MD  Encounter Date: 11/03/2014      PT End of Session - 11/04/14 1751    Visit Number 1  G1   Number of Visits 17   Date for PT Re-Evaluation 01/01/15   Authorization Type Medicare   Authorization Time Period 11-03-14 - 01-01-15   PT Start Time 1535   PT Stop Time 1624   PT Time Calculation (min) 49 min      Past Medical History  Diagnosis Date  . Depression   . Diabetes mellitus   . Hyperlipidemia   . Parkinson's disease   . Memory loss     Past Surgical History  Procedure Laterality Date  . Tonsillectomy  1944  . Cataract extraction w/ intraocular lens  implant, bilateral  2013    bilateral    There were no vitals filed for this visit.  Visit Diagnosis:  Abnormality of gait - Plan: PT plan of care cert/re-cert  Balance problem - Plan: PT plan of care cert/re-cert      Subjective Assessment - 11/03/14 1549    Subjective Pt. was initially diagnosed with Parkinson's disease in Nov. 2012; was referred to Dr. Rexene Alberts by Dr. Brett Fairy for second opinion; Dr. Rexene Alberts diagnosed as a multifactorial gait disorder; pt. denies tremors and reports occasional freezing episodes   Patient is accompained by: --  wife- Hilda   Pertinent History diagnosed with Parkinson's in Nov. 2012   Patient Stated Goals Improve balance and walking   Currently in Pain? No/denies            [facility] PT Assessment - 11/03/14 1553    Assessment   Medical Diagnosis Parkinson's; Gait abnormality   Onset Date/Surgical Date --  Nov. 2012 with gradual worsening within past 2 years   Prior Therapy None   Precautions   Precautions Fall   Balance Screen   Has the patient fallen in the past 6  months Yes   How many times? 10-12   Has the patient had a decrease in activity level because of a fear of falling?  Yes   Is the patient reluctant to leave their home because of a fear of falling?  Yes   [place] residence   Type of [place] to enter   Entrance Stairs-Number of Steps 2   Entrance Stairs-Rails Can reach both   [place] One level   [place] - 2 wheels  cane   Prior Function   Level of [place] Independent with household mobility with device   Standardized Balance Assessment   Five times sit to stand comments  15.66 secs    Berg Balance Test   Sit to Stand Able to stand without using hands and stabilize independently   Standing Unsupported Able to stand safely 2 minutes   Sitting with Back Unsupported but Feet Supported on Floor or Stool Able to sit safely and securely 2 minutes   Stand to Sit Sits safely with minimal use of hands   Transfers Able to transfer safely, minor use of hands   Standing Unsupported with Eyes Closed Able to stand 10 seconds with supervision   Standing Ubsupported with Feet Together Able to place feet  together independently and stand for 1 minute with supervision   From Standing, Reach Forward with Outstretched Arm Can reach confidently >25 cm (10")   From Standing Position, Pick up Object from [place] to pick up shoe, needs supervision   From Standing Position, Turn to Look Behind Over each Shoulder Turn sideways only but maintains balance   Turn 360 Degrees Needs close supervision or verbal cueing   Standing Unsupported, Alternately Place Feet on Step/Stool Able to complete >2 steps/needs minimal assist  19.44 secs   Standing Unsupported, One Foot in Front Able to plae foot ahead of the other independently and hold 30 seconds   Standing on One Leg Tries to lift leg/unable to hold 3 seconds but remains standing independently   Total Score 41     Gait  velocity; 19.90 secs with no device -- 1.65 ft/secs with no device (not used per pt's request but pt required mod assist To prevent fall due to LOB anteriorly)   TUG score 19.62 secs with no device; 22.21 secs with cane; pt requested not to use RW                   PT Education - 11/04/14 1747    Education provided Yes   Education Details educated pt and wife on correct positioning with use of RW and need for this device due to high fall risk; also instructed in correct technique with turning completely around prior to sitting to avoid side sitting and uncontrolled descent      Person(s) Educated Patient;Spouse   Methods Explanation;Demonstration   Comprehension Verbalized understanding;Returned demonstration          PT Short Term Goals - 11/04/14 1800    PT SHORT TERM GOAL #1   Title Improve TUG score to </= 18 secs with cane   Baseline 22.21 secs with cane; 19.62 secs with no device; pt requested not to use RW for this test   Time 4   Period Weeks   Status New   PT SHORT TERM GOAL #2   Title Incr. Berg score to >/= 45/56 to reduce fall risk   Baseline 41/56   Time 4   Period Weeks   Status New   PT SHORT TERM GOAL #3   Title Incr. gait velocity to >/= 2.0 ft/sec with cane   Baseline 1.65 ft/sec with no device per pt's request - gait unsafe without device due to forward momentum with difficulty remaining upright and maintaining balance   Time 4   Period Weeks   Status New   PT SHORT TERM GOAL #4   Title Independent in HEP for balance/BIG exercises as appropriate   Time 4   Period Weeks   Status New           PT Long Term Goals - 11/04/14 1804    PT LONG TERM GOAL #1   Title Incr. Berg score to >/= 49/56 to reduce fall risk   Baseline 41/56   Time 8   Period Weeks   Status New   PT LONG TERM GOAL #2   Title Improve TUG score to </= 15 secs with cane for reduced fall risk   Baseline 22.21 secs with cane   Time 8   Period Weeks   Status New    PT LONG TERM GOAL #3   Title Amb. 300' with RW with S demonstrating ability to control RW and prevent LOB anteriorly   Time 8   Period  Weeks   Status New   PT LONG TERM GOAL #4   Title Incr. gait velocity to >/= 2.2 ft/sec with cane for incr. gait efficiency   Time 8   Period Weeks   Status New               Plan - 11/04/14 1752    Clinical Impression Statement Pt. presents with symptoms and gait deviations/characteristics consistent with Parkinsonism or some movement disorder; pt would benefit from re-eval from neurologist due to severity of sympotms and due to previous neurologist appt last Sept (2015)                                                  Pt will benefit from skilled therapeutic intervention in order to improve on the following deficits Abnormal gait;Decreased activity tolerance;Decreased balance;Decreased mobility;Decreased coordination;Decreased safety awareness;Decreased strength   Rehab Potential Good   PT Frequency 2x / week   PT Duration 8 weeks   PT Treatment/Interventions ADLs/Self Care Home Management;Gait training;Stair training;Functional mobility training;Patient/family education;Neuromuscular re-education;Balance training;Therapeutic exercise;Therapeutic activities   PT Next Visit Plan begin HEP - BIG or balance exercises? Cont with gait and balance training   PT Home Exercise Plan see above   Consulted and Agree with Plan of Care Patient;Family member/caregiver   Family Member Consulted wife - Whitney Muse - 11-08-14 1811    Functional Assessment Tool Used Merrilee Jansky score 41/56:  Gait velocity 1.65 ft/sec without device with LOB; TUG 19.62 secs with no device; 22.21 secs with cane   Functional Limitation Mobility: Walking and moving around   Mobility: Walking and Moving Around Current Status [phone]) At least 60 percent but less than 80 percent impaired, limited or restricted   Mobility: Walking and Moving Around Goal Status [phone]) At least 40  percent but less than 60 percent impaired, limited or restricted       Problem List Patient Active Problem List   Diagnosis Date Noted  . Circadian rhythm sleep disorder, irregular sleep wake type 10/21/2013  . Insomnia due to medical condition 10/21/2013  . Parkinson's disease (tremor, stiffness, slow motion, unstable posture) 10/21/2013  . 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 Pseudobulbar affect 10/21/2013  . Parkinson's disease 09/18/2012  . Abnormality of gait 09/18/2012  . Memory loss 09/18/2012    Dild[place]Jenness Corner, PT 11/04/2014, 6:15 PM  [facility] [address] [place], [place], [address] Phone: [phone]   Fax:  [phone]

## 2014-11-25 ENCOUNTER — Ambulatory Visit: Payer: Medicare Other | Admitting: Physical Therapy

## 2014-11-25 DIAGNOSIS — G4733 Obstructive sleep apnea (adult) (pediatric): Secondary | ICD-10-CM | POA: Diagnosis not present

## 2014-11-27 ENCOUNTER — Ambulatory Visit: Payer: Medicare Other | Admitting: Physical Therapy

## 2014-11-30 DIAGNOSIS — F411 Generalized anxiety disorder: Secondary | ICD-10-CM | POA: Diagnosis not present

## 2014-12-08 ENCOUNTER — Ambulatory Visit: Payer: Medicare Other | Admitting: Physical Therapy

## 2014-12-08 DIAGNOSIS — I10 Essential (primary) hypertension: Secondary | ICD-10-CM | POA: Diagnosis not present

## 2014-12-08 DIAGNOSIS — Z6829 Body mass index (BMI) 29.0-29.9, adult: Secondary | ICD-10-CM | POA: Diagnosis not present

## 2014-12-08 DIAGNOSIS — E119 Type 2 diabetes mellitus without complications: Secondary | ICD-10-CM | POA: Diagnosis not present

## 2014-12-09 ENCOUNTER — Ambulatory Visit: Payer: Medicare Other | Admitting: Physical Therapy

## 2014-12-15 ENCOUNTER — Ambulatory Visit: Payer: Medicare Other | Admitting: Physical Therapy

## 2014-12-16 ENCOUNTER — Ambulatory Visit: Payer: Medicare Other | Admitting: Physical Therapy

## 2014-12-22 ENCOUNTER — Ambulatory Visit: Payer: Medicare Other | Admitting: Physical Therapy

## 2014-12-23 ENCOUNTER — Ambulatory Visit: Payer: Medicare Other | Admitting: Physical Therapy

## 2014-12-26 DIAGNOSIS — G4733 Obstructive sleep apnea (adult) (pediatric): Secondary | ICD-10-CM | POA: Diagnosis not present

## 2014-12-29 ENCOUNTER — Ambulatory Visit: Payer: Medicare Other | Admitting: Physical Therapy

## 2014-12-30 ENCOUNTER — Ambulatory Visit: Payer: Medicare Other | Admitting: Physical Therapy

## 2015-01-05 DIAGNOSIS — E785 Hyperlipidemia, unspecified: Secondary | ICD-10-CM | POA: Diagnosis not present

## 2015-01-05 DIAGNOSIS — F419 Anxiety disorder, unspecified: Secondary | ICD-10-CM | POA: Diagnosis not present

## 2015-01-05 DIAGNOSIS — E119 Type 2 diabetes mellitus without complications: Secondary | ICD-10-CM | POA: Diagnosis not present

## 2015-01-05 DIAGNOSIS — I10 Essential (primary) hypertension: Secondary | ICD-10-CM | POA: Diagnosis not present

## 2015-01-05 DIAGNOSIS — G20 Parkinson's disease: Secondary | ICD-10-CM | POA: Diagnosis not present

## 2015-01-05 DIAGNOSIS — Z6829 Body mass index (BMI) 29.0-29.9, adult: Secondary | ICD-10-CM | POA: Diagnosis not present

## 2015-01-05 DIAGNOSIS — Z5181 Encounter for therapeutic drug level monitoring: Secondary | ICD-10-CM | POA: Diagnosis not present

## 2015-01-26 DIAGNOSIS — G4733 Obstructive sleep apnea (adult) (pediatric): Secondary | ICD-10-CM | POA: Diagnosis not present

## 2015-02-09 ENCOUNTER — Ambulatory Visit: Payer: Commercial Managed Care - HMO | Attending: Neurology | Admitting: Physical Therapy

## 2015-02-15 DIAGNOSIS — S60947A Unspecified superficial injury of left little finger, initial encounter: Secondary | ICD-10-CM | POA: Diagnosis not present

## 2015-02-15 DIAGNOSIS — Z6829 Body mass index (BMI) 29.0-29.9, adult: Secondary | ICD-10-CM | POA: Diagnosis not present

## 2015-02-16 DIAGNOSIS — E119 Type 2 diabetes mellitus without complications: Secondary | ICD-10-CM | POA: Diagnosis not present

## 2015-02-16 DIAGNOSIS — Z23 Encounter for immunization: Secondary | ICD-10-CM | POA: Diagnosis not present

## 2015-02-16 DIAGNOSIS — I10 Essential (primary) hypertension: Secondary | ICD-10-CM | POA: Diagnosis not present

## 2015-02-16 DIAGNOSIS — Z6829 Body mass index (BMI) 29.0-29.9, adult: Secondary | ICD-10-CM | POA: Diagnosis not present

## 2015-02-23 DIAGNOSIS — G20 Parkinson's disease: Secondary | ICD-10-CM | POA: Diagnosis not present

## 2015-02-23 DIAGNOSIS — S60947D Unspecified superficial injury of left little finger, subsequent encounter: Secondary | ICD-10-CM | POA: Diagnosis not present

## 2015-02-23 DIAGNOSIS — Z6829 Body mass index (BMI) 29.0-29.9, adult: Secondary | ICD-10-CM | POA: Diagnosis not present

## 2015-02-25 DIAGNOSIS — G4733 Obstructive sleep apnea (adult) (pediatric): Secondary | ICD-10-CM | POA: Diagnosis not present

## 2015-04-06 DIAGNOSIS — E7800 Pure hypercholesterolemia, unspecified: Secondary | ICD-10-CM | POA: Diagnosis not present

## 2015-04-06 DIAGNOSIS — Z1389 Encounter for screening for other disorder: Secondary | ICD-10-CM | POA: Diagnosis not present

## 2015-04-06 DIAGNOSIS — I10 Essential (primary) hypertension: Secondary | ICD-10-CM | POA: Diagnosis not present

## 2015-04-06 DIAGNOSIS — Z6830 Body mass index (BMI) 30.0-30.9, adult: Secondary | ICD-10-CM | POA: Diagnosis not present

## 2015-04-06 DIAGNOSIS — F418 Other specified anxiety disorders: Secondary | ICD-10-CM | POA: Diagnosis not present

## 2015-04-06 DIAGNOSIS — Z794 Long term (current) use of insulin: Secondary | ICD-10-CM | POA: Diagnosis not present

## 2015-04-06 DIAGNOSIS — G20 Parkinson's disease: Secondary | ICD-10-CM | POA: Diagnosis not present

## 2015-04-06 DIAGNOSIS — E119 Type 2 diabetes mellitus without complications: Secondary | ICD-10-CM | POA: Diagnosis not present

## 2015-05-25 DIAGNOSIS — E119 Type 2 diabetes mellitus without complications: Secondary | ICD-10-CM | POA: Diagnosis not present

## 2015-05-25 DIAGNOSIS — L84 Corns and callosities: Secondary | ICD-10-CM | POA: Diagnosis not present

## 2015-05-25 DIAGNOSIS — I10 Essential (primary) hypertension: Secondary | ICD-10-CM | POA: Diagnosis not present

## 2015-05-25 DIAGNOSIS — Z6829 Body mass index (BMI) 29.0-29.9, adult: Secondary | ICD-10-CM | POA: Diagnosis not present

## 2015-06-08 DIAGNOSIS — Z125 Encounter for screening for malignant neoplasm of prostate: Secondary | ICD-10-CM | POA: Diagnosis not present

## 2015-06-08 DIAGNOSIS — I10 Essential (primary) hypertension: Secondary | ICD-10-CM | POA: Diagnosis not present

## 2015-06-08 DIAGNOSIS — E119 Type 2 diabetes mellitus without complications: Secondary | ICD-10-CM | POA: Diagnosis not present

## 2015-06-15 DIAGNOSIS — Z9181 History of falling: Secondary | ICD-10-CM | POA: Diagnosis not present

## 2015-06-15 DIAGNOSIS — F411 Generalized anxiety disorder: Secondary | ICD-10-CM | POA: Diagnosis not present

## 2015-06-15 DIAGNOSIS — R350 Frequency of micturition: Secondary | ICD-10-CM | POA: Diagnosis not present

## 2015-06-15 DIAGNOSIS — Z5181 Encounter for therapeutic drug level monitoring: Secondary | ICD-10-CM | POA: Diagnosis not present

## 2015-06-15 DIAGNOSIS — Z794 Long term (current) use of insulin: Secondary | ICD-10-CM | POA: Diagnosis not present

## 2015-06-15 DIAGNOSIS — Z6828 Body mass index (BMI) 28.0-28.9, adult: Secondary | ICD-10-CM | POA: Diagnosis not present

## 2015-06-15 DIAGNOSIS — E7800 Pure hypercholesterolemia, unspecified: Secondary | ICD-10-CM | POA: Diagnosis not present

## 2015-06-15 DIAGNOSIS — Z0000 Encounter for general adult medical examination without abnormal findings: Secondary | ICD-10-CM | POA: Diagnosis not present

## 2015-06-15 DIAGNOSIS — G20 Parkinson's disease: Secondary | ICD-10-CM | POA: Diagnosis not present

## 2015-06-21 DIAGNOSIS — F411 Generalized anxiety disorder: Secondary | ICD-10-CM | POA: Diagnosis not present

## 2015-07-23 ENCOUNTER — Other Ambulatory Visit: Payer: Self-pay | Admitting: Neurology

## 2015-07-26 ENCOUNTER — Telehealth: Payer: Self-pay

## 2015-07-26 NOTE — Telephone Encounter (Signed)
Pt needs an appt with Dr. Brett Fairy for further refills.

## 2015-07-26 NOTE — Telephone Encounter (Signed)
Called and spoke to pt's wife and made appt for pt to f/u on 4/4.-SLB

## 2015-07-26 NOTE — Telephone Encounter (Signed)
Please call pt and advise him that for further refills for his mirapex, he needs an follow up appt with Dr. Brett Fairy.

## 2015-07-27 DIAGNOSIS — E119 Type 2 diabetes mellitus without complications: Secondary | ICD-10-CM | POA: Diagnosis not present

## 2015-07-27 DIAGNOSIS — Z6828 Body mass index (BMI) 28.0-28.9, adult: Secondary | ICD-10-CM | POA: Diagnosis not present

## 2015-07-27 DIAGNOSIS — R350 Frequency of micturition: Secondary | ICD-10-CM | POA: Diagnosis not present

## 2015-07-27 DIAGNOSIS — I10 Essential (primary) hypertension: Secondary | ICD-10-CM | POA: Diagnosis not present

## 2015-08-17 ENCOUNTER — Ambulatory Visit (INDEPENDENT_AMBULATORY_CARE_PROVIDER_SITE_OTHER): Payer: Commercial Managed Care - HMO | Admitting: Sports Medicine

## 2015-08-17 ENCOUNTER — Encounter: Payer: Self-pay | Admitting: Sports Medicine

## 2015-08-17 DIAGNOSIS — M79676 Pain in unspecified toe(s): Secondary | ICD-10-CM | POA: Diagnosis not present

## 2015-08-17 DIAGNOSIS — E1142 Type 2 diabetes mellitus with diabetic polyneuropathy: Secondary | ICD-10-CM

## 2015-08-17 DIAGNOSIS — B351 Tinea unguium: Secondary | ICD-10-CM

## 2015-08-17 DIAGNOSIS — G20 Parkinson's disease: Secondary | ICD-10-CM | POA: Diagnosis not present

## 2015-08-17 DIAGNOSIS — R269 Unspecified abnormalities of gait and mobility: Secondary | ICD-10-CM

## 2015-08-17 NOTE — Progress Notes (Signed)
Patient ID: Matthew [name], male   DOB: 1935/05/04, 81 y.o.   MRN: [id number] Subjective: Matthew [name] is a 81 y.o. male patient with history of diabetes who presents to office today complaining of long, painful nails  while ambulating in shoes; unable to trim. Patient states that the glucose reading this morning was "good". Patient denies any new changes in medication or new problems. Patient denies any new cramping, numbness, burning or tingling in the legs.  Admits to history of lack of feeling in both feet and parkinson's; uses knee braces and walker for stability.   Patient Active Problem List   Diagnosis Date Noted  . Circadian rhythm sleep disorder, irregular sleep wake type 10/21/2013  . Insomnia due to medical condition 10/21/2013  . Parkinson's disease (tremor, stiffness, slow motion, unstable posture) ([place]) 10/21/2013  . 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 Pseudobulbar affect 10/21/2013  . Parkinson's disease ([place]) 09/18/2012  . Abnormality of gait 09/18/2012  . Memory loss 09/18/2012   Current Outpatient Prescriptions on File Prior to Visit  Medication Sig Dispense Refill  . ACCU-CHEK COMPACT PLUS test strip daily.    [place] Kitchen ACCU-CHEK SOFTCLIX LANCETS lancets daily.    [place] Kitchen acetaminophen (TYLENOL) 500 MG tablet Take 1,000 mg by mouth every 6 (six) hours as needed for mild pain.    [place] Kitchen aspirin 325 MG tablet Take 650 mg by mouth once.    [place] Kitchen atorvastatin (LIPITOR) 40 MG tablet Take 40 mg by mouth daily.    [place] Kitchen gabapentin (NEURONTIN) 600 MG tablet TAKE 1 TABLET TWICE DAILY 180 tablet 3  . glimepiride (AMARYL) 2 MG tablet Take 2 mg by mouth 2 (two) times daily.     . INVOKANA 300 MG TABS 1 tablet daily.    . JENTADUETO 2.5-500 MG TABS Take 1 tablet by mouth 2 (two) times daily.     [place] Kitchen LORazepam (ATIVAN) 0.5 MG tablet 1 tablet at bedtime.    [place] Kitchen PARoxetine (PAXIL) 20 MG tablet Take 1 tablet (20 mg total) by mouth Nightly. 90 tablet 2  . Polyethyl Glycol-Propyl Glycol (SYSTANE ULTRA OP) Place 2-4  drops into the left eye every 2 (two) hours as needed (pain/dryness).    . pramipexole (MIRAPEX) 0.25 MG tablet TAKE 1 TABLET (0.25 MG TOTAL) BY MOUTH 2 (TWO) TIMES DAILY. 60 tablet 0  . temazepam (RESTORIL) 15 MG capsule Take 15 mg by mouth at bedtime.     [place] Kitchen zolpidem (AMBIEN) 10 MG tablet Take 10 mg by mouth at bedtime as needed.     No current facility-administered medications on file prior to visit.   No Known Allergies  No results found for this or any previous visit (from the past 2160 hour(s)).  Objective: General: Patient is awake, alert, and oriented x 3 and in no acute distress.  Integument: Skin is warm, dry and supple bilateral. Nails are tender, long, thickened and  dystrophic with subungual debris, consistent with onychomycosis, 1-5 bilateral. No signs of infection. No open lesions or preulcerative lesions present bilateral. Remaining integument unremarkable.  Vasculature:  Dorsalis Pedis pulse 1/4 bilateral. Posterior Tibial pulse 1 /4 bilateral.  Capillary fill time <3 sec 1-5 bilateral. Scangt hair growth to the level of the digits. Temperature gradient within normal limits. Mild varicosities present bilateral. No edema present bilateral.   Neurology: The patient has absent sensation measured with a 5.07/10g Semmes Weinstein Monofilament at all pedal sites bilateral . Vibratory sensation absent bilateral with tuning fork. No Babinski sign present bilateral.   Musculoskeletal: Mild  asymptomatic bunion pedal deformities noted bilateral. Muscular strength 4/5 in all lower extremity muscular groups bilateral without pain on range of motion . No tenderness with calf compression bilateral.  Assessment and Plan: Problem List Items Addressed This Visit      Nervous and Auditory   Parkinson's disease ([place])     Other   Abnormality of gait    Other Visit Diagnoses    Pain due to onychomycosis of toenail    -  Primary    Diabetic polyneuropathy associated with type 2 diabetes  mellitus ([place])          -Examined patient. -Discussed and educated patient on diabetic foot care, especially with  regards to the vascular, neurological and musculoskeletal systems.  -Stressed the importance of good glycemic control and the detriment of not  controlling glucose levels in relation to the foot. -Mechanically debrided all nails 1-5 bilateral using sterile nail nipper and filed with dremel without incident  -Recommend cont with knee brace and walker for assistance in gait -Answered all patient questions -Patient to return in 3 months for at risk foot care -Patient advised to call the office if any problems or questions arise in the meantime.  [name], [name]

## 2015-08-17 NOTE — Patient Instructions (Signed)
Diabetes and Foot Care Diabetes may cause you to have problems because of poor blood supply (circulation) to your feet and legs. This may cause the skin on your feet to become thinner, break easier, and heal more slowly. Your skin may become dry, and the skin may peel and crack. You may also have nerve damage in your legs and feet causing decreased feeling in them. You may not notice minor injuries to your feet that could lead to infections or more serious problems. Taking care of your feet is one of the most important things you can do for yourself.  HOME CARE INSTRUCTIONS  Wear shoes at all times, even in the house. Do not go barefoot. Bare feet are easily injured.  Check your feet daily for blisters, cuts, and redness. If you cannot see the bottom of your feet, use a mirror or ask someone for help.  Wash your feet with warm water (do not use hot water) and mild soap. Then pat your feet and the areas between your toes until they are completely dry. Do not soak your feet as this can dry your skin.  Apply a moisturizing lotion or petroleum jelly (that does not contain alcohol and is unscented) to the skin on your feet and to dry, brittle toenails. Do not apply lotion between your toes.  Trim your toenails straight across. Do not dig under them or around the cuticle. File the edges of your nails with an emery board or nail file.  Do not cut corns or calluses or try to remove them with medicine.  Wear clean socks or stockings every day. Make sure they are not too tight. Do not wear knee-high stockings since they may decrease blood flow to your legs.  Wear shoes that fit properly and have enough cushioning. To break in new shoes, wear them for just a few hours a day. This prevents you from injuring your feet. Always look in your shoes before you put them on to be sure there are no objects inside.  Do not cross your legs. This may decrease the blood flow to your feet.  If you find a minor scrape,  cut, or break in the skin on your feet, keep it and the skin around it clean and dry. These areas may be cleansed with mild soap and water. Do not cleanse the area with peroxide, alcohol, or iodine.  When you remove an adhesive bandage, be sure not to damage the skin around it.  If you have a wound, look at it several times a day to make sure it is healing.  Do not use heating pads or hot water bottles. They may burn your skin. If you have lost feeling in your feet or legs, you may not know it is happening until it is too late.  Make sure your health care provider performs a complete foot exam at least annually or more often if you have foot problems. Report any cuts, sores, or bruises to your health care provider immediately. SEEK MEDICAL CARE IF:   You have an injury that is not healing.  You have cuts or breaks in the skin.  You have an ingrown nail.  You notice redness on your legs or feet.  You feel burning or tingling in your legs or feet.  You have pain or cramps in your legs and feet.  Your legs or feet are numb.  Your feet always feel cold. SEEK IMMEDIATE MEDICAL CARE IF:   There is increasing redness,   swelling, or pain in or around a wound.  There is a red line that goes up your leg.  Pus is coming from a wound.  You develop a fever or as directed by your health care provider.  You notice a bad smell coming from an ulcer or wound.   This information is not intended to replace advice given to you by your health care provider. Make sure you discuss any questions you have with your health care provider.   Document Released: 05/05/2000 Document Revised: 01/08/2013 Document Reviewed: 10/15/2012 Elsevier Interactive Patient Education 2016 Elsevier Inc.  

## 2015-08-24 ENCOUNTER — Ambulatory Visit: Payer: Commercial Managed Care - HMO | Admitting: Neurology

## 2015-08-31 ENCOUNTER — Other Ambulatory Visit: Payer: Self-pay | Admitting: Neurology

## 2015-09-24 ENCOUNTER — Telehealth: Payer: Self-pay | Admitting: *Deleted

## 2015-09-24 NOTE — Telephone Encounter (Signed)
Attempted to call and reschedule appt with CM/NP, busy.

## 2015-09-28 ENCOUNTER — Ambulatory Visit (INDEPENDENT_AMBULATORY_CARE_PROVIDER_SITE_OTHER): Payer: Commercial Managed Care - HMO | Admitting: Neurology

## 2015-09-28 ENCOUNTER — Encounter: Payer: Self-pay | Admitting: Neurology

## 2015-09-28 VITALS — BP 120/70 | HR 70 | Resp 20 | Ht 67.0 in | Wt 190.0 lb

## 2015-09-28 DIAGNOSIS — R29898 Other symptoms and signs involving the musculoskeletal system: Secondary | ICD-10-CM

## 2015-09-28 DIAGNOSIS — R296 Repeated falls: Secondary | ICD-10-CM | POA: Diagnosis not present

## 2015-09-28 DIAGNOSIS — Z9989 Dependence on other enabling machines and devices: Principal | ICD-10-CM

## 2015-09-28 DIAGNOSIS — G252 Other specified forms of tremor: Secondary | ICD-10-CM | POA: Diagnosis not present

## 2015-09-28 DIAGNOSIS — G3184 Mild cognitive impairment, so stated: Secondary | ICD-10-CM | POA: Diagnosis not present

## 2015-09-28 DIAGNOSIS — G4733 Obstructive sleep apnea (adult) (pediatric): Secondary | ICD-10-CM

## 2015-09-28 DIAGNOSIS — R351 Nocturia: Secondary | ICD-10-CM

## 2015-09-28 DIAGNOSIS — W19XXXA Unspecified fall, initial encounter: Secondary | ICD-10-CM | POA: Diagnosis not present

## 2015-09-28 MED ORDER — ROPINIROLE HCL 0.5 MG PO TABS: 0.5000 mg | ORAL_TABLET | Freq: Three times a day (TID) | ORAL | Status: DC

## 2015-09-28 MED ORDER — PRAMIPEXOLE DIHYDROCHLORIDE 0.25 MG PO TABS: 0.2500 mg | ORAL_TABLET | Freq: Three times a day (TID) | ORAL | Status: DC

## 2015-09-28 NOTE — Patient Instructions (Signed)
Carbidopa; Levodopa tablets What is this medicine? CARBIDOPA;LEVODOPA (kar bi DOE pa; lee voe DOE pa) is used to treat the symptoms of Parkinson's disease. This medicine may be used for other purposes; ask your health care provider or pharmacist if you have questions. What should I tell my health care provider before I take this medicine? They need to know if you have any of these conditions: -asthma or lung disease -depression or other mental illness -diabetes -glaucoma -heart disease, including history of a heart attack -irregular heart beat -kidney or liver disease -melanoma or suspicious skin lesions -stomach or intestine ulcers -an unusual or allergic reaction to levodopa, carbidopa, other medicines, foods, dyes, or preservatives -pregnant or trying to get pregnant -breast-feeding How should I use this medicine? Take this medicine by mouth with a glass of water. Follow the directions on the prescription label. Take your doses at regular intervals. Do not take your medicine more often than directed. Do not stop taking except on the advice of your doctor or health care professional. Talk to your pediatrician regarding the use of this medicine in children. Special care may be needed. Overdosage: If you think you have taken too much of this medicine contact a poison control center or emergency room at once. NOTE: This medicine is only for you. Do not share this medicine with others. What if I miss a dose? If you miss a dose, take it as soon as you can. If it is almost time for your next dose, take only that dose. Do not take double or extra doses. What may interact with this medicine? Do not take this medicine with any of the following medications: -MAOIs like Marplan, Nardil, and Parnate -reserpine -tetrabenazine This medicine may also interact with the following medications: -alcohol -droperidol -entacapone -iron supplements or multivitamins with iron -isoniazid,  INH -linezolid -medicines for depression, anxiety, or psychotic disturbances -medicines for high blood pressure -medicines for sleep -metoclopramide -papaverine -procarbazine -tedizolid -rasagiline -selegiline -tolcapone This list may not describe all possible interactions. Give your health care provider a list of all the medicines, herbs, non-prescription drugs, or dietary supplements you use. Also tell them if you smoke, drink alcohol, or use illegal drugs. Some items may interact with your medicine. What should I watch for while using this medicine? Visit your doctor or health care professional for regular checks on your progress. It may be several weeks or months before you feel the full benefits of this medicine. Continue to take your medicine on a regular schedule. Do not take any additional medicines for Parkinson's disease without first consulting with your health care provider. You may experience a wearing of effect prior to the time for your next dose of this medicine. You may also experience an on-off effect where the medicine apparently stops working for anything from a minute to several hours, then suddenly starts working again. Tell your doctor or health care professional if any of these symptoms happen to you. Your dose may need to be changed. A high protein diet can slow or prevent absorption of this medicine. Avoid high protein foods near the time of taking this medicine to help to prevent these problems. Take this medicine at least 30 minutes before eating or one hour after meals. You may want to eat higher protein foods later in the day or in small amounts. Discuss your diet with your doctor or health care professional or nutritionist. You may get drowsy or dizzy. Do not drive, use machinery, or do anything that needs  mental alertness until you know how this drug affects you. Do not stand or sit up quickly, especially if you are an older patient. This reduces the risk of dizzy or  fainting spells. Alcohol can make you more drowsy and dizzy. Avoid alcoholic drinks. If you find that you have sudden feelings of wanting to sleep during normal activities, like cooking, watching television, or while driving or riding in a car, you should contact your health care professional. If you are diabetic, this medicine may interfere with the accuracy of some tests for sugar or ketones in the urine (does not interfere with blood tests). Check with your doctor or health care professional before changing the dose of your diabetic medicine. This medicine may discolor the urine or sweat, making it look darker or red in color. This is of no cause for concern. However, this may stain clothing or fabrics. There have been reports of increased sexual urges or other strong urges such as gambling while taking some medicines for Parkinson's disease. If you experience any of these urges while taking this medicine, you should report it to your health care provider as soon as possible. You should check your skin often for changes to moles and new growths while taking this medicine. Call your doctor if you notice any of these changes. What side effects may I notice from receiving this medicine? Side effects that you should report to your doctor or health care professional as soon as possible: -allergic reactions like skin rash, itching or hives, swelling of the face, lips, or tongue -anxiety, confusion, or nervousness -falling asleep during normal activities like driving -fast, irregular heartbeat -hallucination, loss of contact with reality -mood changes like aggressive behavior, depression -stomach pain -trouble passing urine -uncontrolled movements of the mouth, head, hands, feet, shoulders, eyelids or other unusual muscle movements Side effects that usually do not require medical attention (report to your doctor or health care professional if they continue or are bothersome): -headache -loss of  appetite -muscle twitches -nausea, vomiting -nightmares, trouble sleeping -unusually weak or tired This list may not describe all possible side effects. Call your doctor for medical advice about side effects. You may report side effects to FDA at 1-800-FDA-1088. Where should I keep my medicine? Keep out of the reach of children. Store at room temperature between 15 and 30 degrees C (59 and 86 degrees F). Protect from light. Throw away any unused medicine after the expiration date. NOTE: This sheet is a summary. It may not cover all possible information. If you have questions about this medicine, talk to your doctor, pharmacist, or health care provider.    2016, Elsevier/Gold Standard. (2013-07-08 15:41:53)

## 2015-09-28 NOTE — Addendum Note (Signed)
Addended by: Larey Seat on: 09/28/2015 03:33 PM   Modules accepted: Orders, Medications

## 2015-09-28 NOTE — Progress Notes (Signed)
[facility]   Provider:  Larey Seat, M D  Referring Provider: Haywood Pao, MD Primary Care Physician:  Haywood Pao, MD  Chief Complaint  Patient presents with  . Follow-up    cpap, memory, parkinson's, rm 48, with wife    HPI:  Matthew [name] is a 81 y.o. male , seen here as a  revisit  from Dr. Osborne Casco for A regular follow-up on his CPAP machine and compliance the patient also is followed for frequent falls with essential tremor, responding to pramipexole. He reports that the CPAP machine helps him to sleep. He is an urgent need of a right knee replacement and has seen Dr. Maureen Ralphs, we felt that the course of his movement disorder he would be a poor candidate for the surgery and the upcoming recovery and rehabilitation. The patient is currently using a brace for the right knee and he walks with a walker, he has sturdy supportive shoes. He still reports frequent falls.  The patient is using his CPAP machine about 50% of all days and usually under the 4 hour mark. CPAP is set at 6 cm water pressure with 2 cm EPR the residual AHI was 4.3. Mr. Grima feels that he falls asleep easier been using the CPAP but he has not been able to use it every night. He has 2 bathroom breaks each night fragmenting his sleep. In addition a pulse oximetry was provided this was actually performed in October 2015 shortly after he had seen my colleague, Dr. Rexene Alberts. At that time the patient had spent 56.9 minutes of the total sleep time under 89% saturation for oxygen but under 88% or at 88% for only 28.1 minutes. He is not using oxygen based on these mild results.  His cognitive function was evaluated in a Montral cognitive assessment test, he scored 21 out of 30 points his great trouble today with the serial 7 subtraction he remembered 2 out of 5 recall words. He was able to do a trail making making test, but not able to draw the clock face or the cube image.  Montreal Cognitive  Assessment  09/28/2015  Visuospatial/ Executive (0/5) 2  Naming (0/3) 3  Attention: Read list of digits (0/2) 2  Attention: Read list of letters (0/1) 1  Attention: Serial 7 subtraction starting at 100 (0/3) 1  Language: Repeat phrase (0/2) 2  Language : Fluency (0/1) 1  Abstraction (0/2) 2  Delayed Recall (0/5) 2  Orientation (0/6) 5  Total 21  Adjusted Score (based on education) 21   No flowsheet data found.  He did not endorsed the Epworth Sleepiness Scale, the fatigue severity scale of the restless leg questionnaire    Sleep medical history and family sleep history: Social history:  Married 59 years, drinks a lot of caffeine, used to drink 20 cups of coffee, now 5-6 a day! No tobacco use, ETOH; none.   Dr. Guadelupe Sabin  Summary, September 2015 .   Matthew [name] is a very pleasant 81 y.o.-year old male with an underlying medical history of hyperlipidemia, type 2 diabetes, depression, insomnia, who has had problems with tremors, gait disorder and recurrent falls for the past 5 years, gradual in onset and progressive with time.  he has had multiple falls and in fact fell last night. Thankfully he did not injure himself. I think we are dealing with a multifactorial gait disorder. Contributors are advanced age, arthritis particularly of the right knee, evidence of mild neuropathy, and prior  history of excessive alcohol use as well as medication effect. He is strongly advised not to take more medication than prescribed. Of note, he is taking multiple sedating medications and is strongly advised not to take more than prescribed. In addition, he has a tremor which is bilateral and confined to the upper extremities but not typically Parkinsonian. He is on low-dose Mirapex and I would recommend that he discontinue this. I would recommend that you consider physical therapy for gait and balance training and consider an MRI brain. He was recently diagnosed with obstructive sleep apnea and will be set up  with CPAP therapy and I suggested that he followup with you approximately 6-8 weeks after being set up with CPAP at home. He follows with Dr. Casimiro Needle for his depression.  I explained to him that he does not look like a typical Parkinson's patient. He was not orthostatic on examination. He has an appointment with orthopedics soon for evaluation of his right knee. I suggested he discuss with you the MRI brain, physical therapy referral and CPAP treatment.  I do not need to see him back on a scheduled basis and advised him to followup with you as previously planned. His upper extremity prepped tremor may be related to his excessive caffeine intake. I advised him to reduce his caffeine intake and increase his water intake. I answered all their questions and the patient and his wife were in agreement. Most of my 45 minute visit was spent in counseling and coordination of care and reviewing test results.   Thank you very much for allowing me to participate in the care of this nice patient. If I can be of any further assistance to you please do not hesitate to call me at [phone].      Social History   Social History  . Marital Status: Married    Spouse Name: Matthew [name]  . Number of Children: 1  . Years of Education: 12   Occupational History  . Not on file.   Social History Main Topics  . Smoking status: Former Smoker    Types: Cigarettes    Quit date: 03/14/1974  . Smokeless tobacco: Never Used  . Alcohol Use: No  . Drug Use: No  . Sexual Activity: Not on file   Other Topics Concern  . Not on file   Social History Narrative   Patient is married Personal assistant).   Patient has one son.   Patient is retired.   Patient has a high school education.   Patient is left-handed.   Patient drinks about 20 cups of coffee daily.    Family History  Problem Relation Age of Onset  . Colon cancer Neg Hx   . Stomach cancer Neg Hx   . Dementia Mother     Past Medical History  Diagnosis Date  .  Depression   . Diabetes mellitus ([place])   . Hyperlipidemia   . Parkinson's disease ([place])   . Memory loss     Past Surgical History  Procedure Laterality Date  . Tonsillectomy  1944  . Cataract extraction w/ intraocular lens  implant, bilateral  2013    bilateral    Current Outpatient Prescriptions  Medication Sig Dispense Refill  . ACCU-CHEK COMPACT PLUS test strip daily.    Marland Kitchen ACCU-CHEK SOFTCLIX LANCETS lancets daily.    Marland Kitchen acetaminophen (TYLENOL) 500 MG tablet Take 1,000 mg by mouth every 6 (six) hours as needed for mild pain.    Marland Kitchen aspirin 325 MG tablet Take  650 mg by mouth once.    Marland Kitchen atorvastatin (LIPITOR) 40 MG tablet Take 40 mg by mouth daily.    Marland Kitchen glimepiride (AMARYL) 2 MG tablet Take 2 mg by mouth 2 (two) times daily.     . INVOKANA 300 MG TABS 1 tablet daily.    . JENTADUETO 2.5-500 MG TABS Take 1 tablet by mouth 2 (two) times daily.     Marland Kitchen LORazepam (ATIVAN) 0.5 MG tablet 1 tablet at bedtime.    Marland Kitchen PARoxetine (PAXIL) 20 MG tablet Take 1 tablet (20 mg total) by mouth Nightly. 90 tablet 2  . pramipexole (MIRAPEX) 0.25 MG tablet TAKE 1 TABLET (0.25 MG TOTAL) BY MOUTH 2 (TWO) TIMES DAILY. 60 tablet 0  . temazepam (RESTORIL) 15 MG capsule Take 15 mg by mouth at bedtime.      No current facility-administered medications for this visit.    Allergies as of 09/28/2015  . (No Known Allergies)    Vitals: BP 120/70 mmHg  Pulse 70  Resp 20  Ht 5\' 7"  (1.702 m)  Wt 190 lb (86.183 kg)  BMI 29.75 kg/m2 Last Weight:  Wt Readings from Last 1 Encounters:  09/28/15 190 lb (86.183 kg)   [id number] mass index is 29.75 kg/(m^2).     Last Height:   Ht Readings from Last 1 Encounters:  09/28/15 5\' 7"  (1.702 m)    Physical exam:  General: The patient is awake, alert and appears not in acute distress. The patient is well groomed. Head: Normocephalic, atraumatic. Neck is supple. Mallampati 2   neck circumference: 17. Cardiovascular:  Regular rate and rhythm, without  murmurs or carotid  bruit, and without distended neck veins. Respiratory: Lungs are clear to auscultation. Skin:  Without evidence of edema, or rash Trunk:. The patient's posture is not erect.   Neurologic exam : The patient is awake and alert, oriented to place and time.   Memory subjective  described as intact.   MOCA: Montreal Cognitive Assessment  09/28/2015  Visuospatial/ Executive (0/5) 2  Naming (0/3) 3  Attention: Read list of digits (0/2) 2  Attention: Read list of letters (0/1) 1  Attention: Serial 7 subtraction starting at 100 (0/3) 1  Language: Repeat phrase (0/2) 2  Language : Fluency (0/1) 1  Abstraction (0/2) 2  Delayed Recall (0/5) 2  Orientation (0/6) 5  Total 21  Adjusted Score (based on education) 21   MMSE:No flowsheet data found.     Attention span & concentration ability appears normal.  Speech is fluent,  without  dysarthria, mild dysphonia or aphasia.  Mood and affect are appropriate.  Cranial nerves: Pupils are equal and briskly reactive to light.  Extraocular movements  in vertical and horizontal planes intact and without nystagmus. Visual fields by finger perimetry are intact. Hearing to finger rub intact. Facial sensation intact to fine touch.Facial motor strength is symmetric and tongue and uvula move midline. Shoulder shrug was symmetrical.   Motor exam:  Increasing muscle tone noted, with cogwheel rigidity over both biceps, difficulties to extend the left leg at the knee, and right knee range of motion limitation with the brace., muscle bulk and symmetric strength in all extremities.  Sensory:  Fine touch, pinprick and vibration were normal.  Coordination: Rapid alternating movements in the fingers/hands was intact- tapping index-finger  and thumb  Finger-to-nose maneuver normal without evidence of ataxia, dysmetria and mild  Tremor. Grip strength was limited bilaterally.  Gait and station: Patient walks with a walker as assistive device and  is bracing himself to  rise from a seated position.   Deep tendon reflexes: in the  upper and lower extremities are symmetric and intact, except right patella reflex which is not obtained due to the knee brace. Babinski maneuver response is downgoing.  The patient was advised of the nature of the diagnosed sleep disorder , the treatment options and risks for general a health and wellness arising from not treating the condition.  I spent more than 40 minutes of face to face time with the patient. Greater than 50% of time was spent in counseling and coordination of care. We have discussed the diagnosis and differential and I answered the patient's questions.     Assessment:  After physical and neurologic examination, review of laboratory studies,  Personal review of imaging studies, reports of other /same  Imaging studies ,  Results of polysomnography/ neurophysiology testing and pre-existing records as far as provided in visit., my assessment is   1) Mr. Character has reduced his caffeine intake significantly significantly, but has also allowed him to reduce his tremor. He is significantly less tremulous.   He does however have cogwheel rigidity over both biceps. He is regularly with lifting weights and exercising now. He has trouble with smaller steps and little shuffling, he is using a walker for safety. Some days he will use a cane only. He is at a higher fall risk.  2) fall frequency. Cells every second week or so. He has developed a strategy where he rolls on the floor until he can get up again.   3) progressive memory deficits, monitor cognitive assessment test documented cognitive impairment. Multiple mainly affecting short-term memory and some visual spatial orientation. He passed the trail making test. 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 He assured me that he will not use it on a highway and that he and his wife both drive  together.    Plan:  Treatment plan and additional workup :  I  am concerned that there are some more Parkinson features - on the other hand ; the patient has not developed a masked expressionless face, he still can swallow very well, he does not have dysautonomia is falls have been related to mechanical barrier is falling over a stool for an object etc. Stumbling. Mr. Bennion continues to take Mirapex in its generic form pramipexole I think it may be time to start Sinemet. This will hopefully help him to also be more limber and less easily off-balance. His memory deficit would be cognitive impairment with amnesia. He is diabetic with peripheral neuropathy, too.  I like to try sinemet, 25-100 mg. Tid.   He had a brain scan in 2015.  Sleep study . Encourage at least 4 hours of nightly CPAP use.      Asencion Partridge Jameia Makris MD  09/28/2015   CC: Haywood Pao, [address], [place] [address]

## 2015-09-29 LAB — COMPREHENSIVE METABOLIC PANEL
ALBUMIN: 4.4 g/dL (ref 3.5–4.8)
ALT: 17 IU/L (ref 0–44)
AST: 19 IU/L (ref 0–40)
Albumin/Globulin Ratio: 1.8 (ref 1.2–2.2)
Alkaline Phosphatase: 73 IU/L (ref 39–117)
BILIRUBIN TOTAL: 0.3 mg/dL (ref 0.0–1.2)
BUN / CREAT RATIO: 24 (ref 10–24)
BUN: 20 mg/dL (ref 8–27)
CO2: 24 mmol/L (ref 18–29)
Calcium: 9.4 mg/dL (ref 8.6–10.2)
Chloride: 100 mmol/L (ref 96–106)
Creatinine, Ser: 0.83 mg/dL (ref 0.76–1.27)
GFR, EST AFRICAN AMERICAN: 97 mL/min/{1.73_m2} (ref >59)
GFR, EST NON AFRICAN AMERICAN: 84 mL/min/{1.73_m2} (ref >59)
GLOBULIN, TOTAL: 2.4 g/dL (ref 1.5–4.5)
Glucose: 144 mg/dL — ABNORMAL HIGH (ref 65–99)
POTASSIUM: 4.5 mmol/L (ref 3.5–5.2)
SODIUM: 142 mmol/L (ref 134–144)
Total Protein: 6.8 g/dL (ref 6.0–8.5)

## 2015-10-03 ENCOUNTER — Other Ambulatory Visit: Payer: Self-pay | Admitting: Neurology

## 2015-10-06 DIAGNOSIS — Z6828 Body mass index (BMI) 28.0-28.9, adult: Secondary | ICD-10-CM | POA: Diagnosis not present

## 2015-10-06 DIAGNOSIS — K4090 Unilateral inguinal hernia, without obstruction or gangrene, not specified as recurrent: Secondary | ICD-10-CM | POA: Diagnosis not present

## 2015-10-12 ENCOUNTER — Ambulatory Visit (HOSPITAL_COMMUNITY)
Admission: RE | Admit: 2015-10-12 | Discharge: 2015-10-12 | Disposition: A | Discharge status: Home / Self Care | Payer: Commercial Managed Care - HMO | Source: Ambulatory Visit | Attending: Neurology | Admitting: Neurology

## 2015-10-12 DIAGNOSIS — R351 Nocturia: Secondary | ICD-10-CM | POA: Insufficient documentation

## 2015-10-12 DIAGNOSIS — R29898 Other symptoms and signs involving the musculoskeletal system: Secondary | ICD-10-CM | POA: Insufficient documentation

## 2015-10-12 DIAGNOSIS — R296 Repeated falls: Secondary | ICD-10-CM | POA: Diagnosis not present

## 2015-10-12 DIAGNOSIS — I6782 Cerebral ischemia: Secondary | ICD-10-CM | POA: Insufficient documentation

## 2015-10-12 DIAGNOSIS — G252 Other specified forms of tremor: Secondary | ICD-10-CM | POA: Diagnosis not present

## 2015-10-12 DIAGNOSIS — G4733 Obstructive sleep apnea (adult) (pediatric): Secondary | ICD-10-CM | POA: Insufficient documentation

## 2015-10-12 DIAGNOSIS — G3184 Mild cognitive impairment, so stated: Secondary | ICD-10-CM | POA: Diagnosis not present

## 2015-10-12 DIAGNOSIS — G9389 Other specified disorders of brain: Secondary | ICD-10-CM | POA: Insufficient documentation

## 2015-10-12 DIAGNOSIS — Z9989 Dependence on other enabling machines and devices: Secondary | ICD-10-CM

## 2015-10-12 MED ORDER — GADOBENATE DIMEGLUMINE 529 MG/ML IV SOLN
18.0000 mL | Freq: Once | INTRAVENOUS | Status: AC | PRN
  Administered 2015-10-12: 18 mL via INTRAVENOUS

## 2015-10-13 ENCOUNTER — Telehealth: Payer: Self-pay

## 2015-10-13 NOTE — Telephone Encounter (Signed)
Spoke to pt's wife (per DPR) and advised her that pt's MRI brain showed age related change/white matter disease, the capillary blood vessels of the brain have aged this way. There is some shrinkages but it is not diagnostic for dementia. Pt's wife verbalized understanding of results. Pt's wife had no questions at this time but was encouraged to call back if questions arise.

## 2015-10-13 NOTE — Telephone Encounter (Signed)
-----   Message from Larey Seat, MD sent at 10/13/2015  2:11 PM EDT ----- There is age related change / white matter disease - this is microvascular , the capillary blood vessels of the brain have aged in this fashion. There is somel shrinkage- this is not diagnostic for  dementia.  CD

## 2015-10-26 DIAGNOSIS — K4090 Unilateral inguinal hernia, without obstruction or gangrene, not specified as recurrent: Secondary | ICD-10-CM | POA: Diagnosis not present

## 2015-11-16 ENCOUNTER — Encounter: Payer: Self-pay | Admitting: Sports Medicine

## 2015-11-16 ENCOUNTER — Ambulatory Visit (INDEPENDENT_AMBULATORY_CARE_PROVIDER_SITE_OTHER): Payer: Commercial Managed Care - HMO | Admitting: Sports Medicine

## 2015-11-16 DIAGNOSIS — B351 Tinea unguium: Secondary | ICD-10-CM

## 2015-11-16 DIAGNOSIS — M79676 Pain in unspecified toe(s): Secondary | ICD-10-CM | POA: Diagnosis not present

## 2015-11-16 DIAGNOSIS — R269 Unspecified abnormalities of gait and mobility: Secondary | ICD-10-CM

## 2015-11-16 DIAGNOSIS — G20 Parkinson's disease: Secondary | ICD-10-CM

## 2015-11-16 DIAGNOSIS — E1142 Type 2 diabetes mellitus with diabetic polyneuropathy: Secondary | ICD-10-CM

## 2015-11-16 NOTE — Progress Notes (Signed)
Patient ID: MALIKY ALBERICO, male   DOB: 1934/10/20, 81 y.o.   MRN: [id number]   Subjective: Matthew [name] is a 81 y.o. male patient with history of diabetes who returns to office today complaining of long, painful nails while ambulating in shoes; unable to trim. Patient states that the glucose reading this morning was "good", 126. Patient denies any new changes in medication or new problems. Patient denies any new cramping, numbness, burning or tingling in the legs.  Patient Active Problem List   Diagnosis Date Noted  . OSA on CPAP 09/28/2015  . Cogwheel rigidity 09/28/2015  . Nocturia more than twice per night 09/28/2015  . Action tremor 09/28/2015  . Circadian rhythm sleep disorder, irregular sleep wake type 10/21/2013  . Insomnia due to medical condition 10/21/2013  . Parkinson's disease (tremor, stiffness, slow motion, unstable posture) ([place]) 10/21/2013  . 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 Pseudobulbar affect 10/21/2013  . Parkinson's disease ([place]) 09/18/2012  . Abnormality of gait 09/18/2012  . Memory loss 09/18/2012   Current Outpatient Prescriptions on File Prior to Visit  Medication Sig Dispense Refill  . ACCU-CHEK COMPACT PLUS test strip daily.    Marland Kitchen ACCU-CHEK SOFTCLIX LANCETS lancets daily.    Marland Kitchen acetaminophen (TYLENOL) 500 MG tablet Take 1,000 mg by mouth every 6 (six) hours as needed for mild pain.    Marland Kitchen aspirin 325 MG tablet Take 650 mg by mouth once.    Marland Kitchen atorvastatin (LIPITOR) 40 MG tablet Take 40 mg by mouth daily.    Marland Kitchen glimepiride (AMARYL) 2 MG tablet Take 2 mg by mouth 2 (two) times daily.     . INVOKANA 300 MG TABS 1 tablet daily.    . JENTADUETO 2.5-500 MG TABS Take 1 tablet by mouth 2 (two) times daily.     Marland Kitchen LORazepam (ATIVAN) 0.5 MG tablet 1 tablet at bedtime.    Marland Kitchen PARoxetine (PAXIL) 20 MG tablet Take 1 tablet (20 mg total) by mouth Nightly. 90 tablet 2  . rOPINIRole (REQUIP) 0.5 MG tablet Take 1 tablet (0.5 mg total) by mouth 3 (three) times daily. 90 tablet 5   . temazepam (RESTORIL) 15 MG capsule Take 15 mg by mouth at bedtime.      No current facility-administered medications on file prior to visit.   No Known Allergies  Recent Results (from the past 2160 hour(s))  Comprehensive metabolic panel     Status: Abnormal   Collection Time: 09/28/15  4:27 PM  Result Value Ref Range   Glucose 144 (H) 65 - 99 mg/dL   BUN 20 8 - 27 mg/dL   Creatinine, Ser 0.83 0.76 - 1.27 mg/dL   GFR calc non Af Amer 84 >59 mL/min/1.73   GFR calc Af Amer 97 >59 mL/min/1.73   BUN/Creatinine Ratio 24 10 - 24   Sodium 142 134 - 144 mmol/L   Potassium 4.5 3.5 - 5.2 mmol/L   Chloride 100 96 - 106 mmol/L   CO2 24 18 - 29 mmol/L   Calcium 9.4 8.6 - 10.2 mg/dL   Total Protein 6.8 6.0 - 8.5 g/dL   Albumin 4.4 3.5 - 4.8 g/dL   Globulin, Total 2.4 1.5 - 4.5 g/dL   Albumin/Globulin Ratio 1.8 1.2 - 2.2   Bilirubin Total 0.3 0.0 - 1.2 mg/dL   Alkaline Phosphatase 73 39 - 117 IU/L   AST 19 0 - 40 IU/L   ALT 17 0 - 44 IU/L    Objective: General: Patient is awake, alert, and oriented  x 3 and in no acute distress.  Integument: Skin is warm, dry and supple bilateral. Nails are tender, long, thickened and  dystrophic with subungual debris, consistent with onychomycosis, 1-5 bilateral. No signs of infection. No open lesions or preulcerative lesions present bilateral. Remaining integument unremarkable.  Vasculature:  Dorsalis Pedis pulse 1/4 bilateral. Posterior Tibial pulse 1 /4 bilateral.  Capillary fill time <3 sec 1-5 bilateral. Scangt hair growth to the level of the digits. Temperature gradient within normal limits. Mild varicosities present bilateral. No edema present bilateral.   Neurology: The patient has absent sensation measured with a 5.07/10g Semmes Weinstein Monofilament at all pedal sites bilateral . Vibratory sensation absent bilateral with tuning fork. No Babinski sign present bilateral.   Musculoskeletal: Mild asymptomatic bunion pedal deformities noted  bilateral. Muscular strength 4/5 in all lower extremity muscular groups bilateral without pain on range of motion . No tenderness with calf compression bilateral.  Assessment and Plan: Problem List Items Addressed This Visit      Nervous and Auditory   Parkinson's disease ([place])     Other   Abnormality of gait    Other Visit Diagnoses    Pain due to onychomycosis of toenail    -  Primary    Diabetic polyneuropathy associated with type 2 diabetes mellitus ([place])          -Examined patient. -Discussed and educated patient on diabetic foot care, especially with  regards to the vascular, neurological and musculoskeletal systems.  -Stressed the importance of good glycemic control and the detriment of not  controlling glucose levels in relation to the foot. -Mechanically debrided all nails 1-5 bilateral using sterile nail nipper and filed with dremel without incident  -Recommend cont with knee brace and walker for assistance in gait/Parkinson's disease  -Answered all patient questions -Patient to return in 3 months for at risk foot care -Patient advised to call the office if any problems or questions arise in the meantime.  [name], [name]

## 2015-12-14 DIAGNOSIS — Z6828 Body mass index (BMI) 28.0-28.9, adult: Secondary | ICD-10-CM | POA: Diagnosis not present

## 2015-12-14 DIAGNOSIS — K4090 Unilateral inguinal hernia, without obstruction or gangrene, not specified as recurrent: Secondary | ICD-10-CM | POA: Diagnosis not present

## 2015-12-14 DIAGNOSIS — Z794 Long term (current) use of insulin: Secondary | ICD-10-CM | POA: Diagnosis not present

## 2015-12-14 DIAGNOSIS — G20 Parkinson's disease: Secondary | ICD-10-CM | POA: Diagnosis not present

## 2015-12-14 DIAGNOSIS — F411 Generalized anxiety disorder: Secondary | ICD-10-CM | POA: Diagnosis not present

## 2015-12-14 DIAGNOSIS — E119 Type 2 diabetes mellitus without complications: Secondary | ICD-10-CM | POA: Diagnosis not present

## 2015-12-14 DIAGNOSIS — Z5181 Encounter for therapeutic drug level monitoring: Secondary | ICD-10-CM | POA: Diagnosis not present

## 2015-12-14 DIAGNOSIS — I10 Essential (primary) hypertension: Secondary | ICD-10-CM | POA: Diagnosis not present

## 2015-12-14 DIAGNOSIS — E7800 Pure hypercholesterolemia, unspecified: Secondary | ICD-10-CM | POA: Diagnosis not present

## 2015-12-20 DIAGNOSIS — F411 Generalized anxiety disorder: Secondary | ICD-10-CM | POA: Diagnosis not present

## 2015-12-28 DIAGNOSIS — E119 Type 2 diabetes mellitus without complications: Secondary | ICD-10-CM | POA: Diagnosis not present

## 2016-03-21 DIAGNOSIS — I10 Essential (primary) hypertension: Secondary | ICD-10-CM | POA: Diagnosis not present

## 2016-03-21 DIAGNOSIS — E119 Type 2 diabetes mellitus without complications: Secondary | ICD-10-CM | POA: Diagnosis not present

## 2016-03-21 DIAGNOSIS — L89322 Pressure ulcer of left buttock, stage 2: Secondary | ICD-10-CM | POA: Diagnosis not present

## 2016-03-21 DIAGNOSIS — Z6827 Body mass index (BMI) 27.0-27.9, adult: Secondary | ICD-10-CM | POA: Diagnosis not present

## 2016-03-27 ENCOUNTER — Encounter: Payer: Self-pay | Admitting: Neurology

## 2016-03-28 ENCOUNTER — Ambulatory Visit (INDEPENDENT_AMBULATORY_CARE_PROVIDER_SITE_OTHER): Payer: Commercial Managed Care - HMO | Admitting: Neurology

## 2016-03-28 ENCOUNTER — Encounter: Payer: Self-pay | Admitting: Neurology

## 2016-03-28 VITALS — BP 132/88 | HR 76 | Resp 20 | Ht 68.0 in | Wt 184.0 lb

## 2016-03-28 DIAGNOSIS — R251 Tremor, unspecified: Secondary | ICD-10-CM | POA: Diagnosis not present

## 2016-03-28 DIAGNOSIS — G4733 Obstructive sleep apnea (adult) (pediatric): Secondary | ICD-10-CM | POA: Diagnosis not present

## 2016-03-28 DIAGNOSIS — Z9989 Dependence on other enabling machines and devices: Secondary | ICD-10-CM

## 2016-03-28 DIAGNOSIS — G4730 Sleep apnea, unspecified: Secondary | ICD-10-CM

## 2016-03-28 DIAGNOSIS — G4710 Hypersomnia, unspecified: Secondary | ICD-10-CM

## 2016-03-28 NOTE — Patient Instructions (Signed)
Compensation Strategies for Tremors  When eating, try the following Eat out of bowls, divided plates, or use a plate guard (available at a medical supply store) and eat with a spoon so that you have an edge to scoop up food. Try raising your plate so that there is less distance between the plate and mouth.Try stabilizing elbows on the tables or against your body. Use utensil with built-up/larger grips as they are easier to hold.  When writing, try the following: Stabilize forearm on the table. Take your time as rushing/being stressed can increase tremors. Try a felt-tipped pen, it does not glide as much.  Avoid gel pens ( they move to much ). Consider using pre-printed labels with your name and address (carry them with you when you go out) or you can get stamps with your address or signature on it. Use a small tape recorder to record messages/reminders for yourself. Use pens with bigger grips.  When brushing your teeth, putting on make-up, or styling hair, try the following: Use an electric toothbrush. Use items with built-up grips. Stabilize your elbows against your body or on the counter. Use long-handled brushes/combs. Use a hair dryer with a stand.  In general: Avoid stress, fatigue or rushing as this can increase tremors. Sit down for activities that require more control/coordination. Perform "flicks".  

## 2016-03-28 NOTE — Progress Notes (Signed)
SLEEP MED[facility]INE CLINIC   Provider:  Larey Seat, M D  Referring Provider: Haywood Pao, MD Primary Care Physician:  Haywood Pao, MD  Chief Complaint  Patient presents with  . Follow-up    needs new tubing    HPI:  Matthew [name] is a 81 y.o. male , seen here as a  revisit  from Dr. Osborne Casco for A regular follow-up on his CPAP machine and compliance the patient also is followed for frequent falls with essential tremor, responding to pramipexole. He reports that the CPAP machine helps him to sleep. He is an urgent need of a right knee replacement and has seen Dr. Maureen Ralphs, we felt that the course of his movement disorder he would be a poor candidate for the surgery and the upcoming recovery and rehabilitation. The patient is currently using a brace for the right knee and he walks with a walker, he has sturdy supportive shoes. He still reports frequent falls.  The patient is using his CPAP machine about 50% of all days and usually under the 4 hour mark. CPAP is set at 6 cm water pressure with 2 cm EPR the residual AHI was 4.3. [name] feels that he falls asleep easier been using the CPAP but he has not been able to use it every night. He has 2 bathroom breaks each night fragmenting his sleep. In addition a pulse oximetry was provided this was actually performed in October 2015 shortly after he had seen my colleague, Dr. Rexene Alberts. At that time the patient had spent 56.9 minutes of the total sleep time under 89% saturation for oxygen but under 88% or at 88% for only 28.1 minutes. He is not using oxygen based on these mild results.  His cognitive function was evaluated in a Montral cognitive assessment test, he scored 21 out of 30 points his great trouble today with the serial 7 subtraction he remembered 2 out of 5 recall words. He was able to do a trail making making test, but not able to draw the clock face or the cube image.  03-28-2016, [name] presents today for his  routine visit 0.1 as are his CPAP compliance which has been down to 57% also his AHI is much reduced on CPAP to 1.8 his average user time was only 1 hour and 55 minutes. He is using CPAP at 6 cm water pressure with 2 cm EPR and he does not have significant air leaks with the [place] reports that he is blowing out of his masked waking him up this is not a seal break at the face but in the tubing. I will have his CPAP today investigated by our sleep lab manager he did not bring the machine today so I will ask him to bring [facility] He has tried duct tape to fix it. We have also done a memory test today and the patient did very well 26 out of 30 points on his Mini-Mental, a more current was performed last time at 21 out of 30 points. [name] will turn 89 We, I think he may have mild cognitive impairment but I would not diagnose him as demented. His Epworth sleepiness score was endorsed at 15 points but since he hasn't been able to use his CPAP machine this is not a surprise. I would like for him to return to daily using the machine and have the leak fixed professionally.  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  Montreal Cognitive Assessment  09/28/2015  Visuospatial/ Executive (0/5) 2  Naming (0/3) 3  Attention: Read list of digits (0/2) 2  Attention: Read list of letters (0/1) 1  Attention: Serial 7 subtraction starting at 100 (0/3) 1  Language: Repeat phrase (0/2) 2  Language : Fluency (0/1) 1  Abstraction (0/2) 2  Delayed Recall (0/5) 2  Orientation (0/6) 5  Total 21  Adjusted Score (based on education) 21   MMSE - Mini Mental State Exam 03/28/2016  Orientation to time 4  Orientation to Place 5  Registration 3  Attention/ Calculation 4  Recall 3  Language- name 2 objects 2  Language- repeat 0  Language- follow 3 step command 3  Language- read & follow direction 1  Write a sentence 1  Copy design 0  Total score 26    He did not endorsed the Epworth Sleepiness Scale,  the fatigue severity scale of the restless leg questionnaire    Sleep medical history and family sleep history: Social history:  Married 59 years, drinks a lot of caffeine, used to drink 20 cups of coffee, now 5-6 a day! No tobacco use, ETOH; none.     Social History   Social History  . Marital status: Married    Spouse name: Matthew [name]  . Number of children: 1  . Years of education: 69   Occupational History  . Not on file.   Social History Main Topics  . Smoking status: Former Smoker    Types: Cigarettes    Quit date: 03/14/1974  . Smokeless tobacco: Never Used  . Alcohol use No  . Drug use: No  . Sexual activity: Not on file   Other Topics Concern  . Not on file   Social History Narrative   Patient is married Personal assistant).   Patient has one son.   Patient is retired.   Patient has a high school education.   Patient is left-handed.   Patient drinks about 20 cups of coffee daily.    Family History  Problem Relation Age of Onset  . Colon cancer Neg Hx   . Stomach cancer Neg Hx   . Dementia Mother     Past Medical History:  Diagnosis Date  . Depression   . Diabetes mellitus ([place])   . Hyperlipidemia   . Memory loss   . Parkinson's disease [facility])     Past Surgical History:  Procedure Laterality Date  . CATARACT EXTRACTION W/ INTRAOCULAR LENS  IMPLANT, BILATERAL  2013   bilateral  . TONSILLECTOMY  1944    Current Outpatient Prescriptions  Medication Sig Dispense Refill  . ACCU-CHEK COMPACT PLUS test strip daily.    Marland Kitchen ACCU-CHEK SOFTCLIX LANCETS lancets daily.    Marland Kitchen acetaminophen (TYLENOL) 500 MG tablet Take 1,000 mg by mouth every 6 (six) hours as needed for mild pain.    Marland Kitchen aspirin 325 MG tablet Take 650 mg by mouth once.    Marland Kitchen atorvastatin (LIPITOR) 40 MG tablet Take 40 mg by mouth daily.    Marland Kitchen glimepiride (AMARYL) 2 MG tablet Take 2 mg by mouth 2 (two) times daily.     Marland Kitchen JARDIANCE 25 MG TABS tablet Take 25 mg by mouth daily.    . JENTADUETO 2.5-500 MG TABS Take  1 tablet by mouth 2 (two) times daily.     Marland Kitchen LORazepam (ATIVAN) 0.5 MG tablet 1 tablet at bedtime.    Marland Kitchen PARoxetine (PAXIL) 20 MG tablet Take 1 tablet (20 mg total) by mouth Nightly.  90 tablet 2  . rOPINIRole (REQUIP) 0.5 MG tablet Take 1 tablet (0.5 mg total) by mouth 3 (three) times daily. 90 tablet 5  . temazepam (RESTORIL) 15 MG capsule Take 15 mg by mouth at bedtime.      No current facility-administered medications for this visit.     Allergies as of 03/28/2016  . (No Known Allergies)    Vitals: BP 132/88   Pulse 76   Resp 20   Ht 5\' 8"  (1.727 m)   Wt 184 lb (83.5 kg)   BMI 27.98 kg/m  Last Weight:  Wt Readings from Last 1 Encounters:  03/28/16 184 lb (83.5 kg)   [id number] mass index is 27.98 kg/m.     Last Height:   Ht Readings from Last 1 Encounters:  03/28/16 5\' 8"  (1.727 m)    Physical exam:  General: The patient is awake, alert and appears not in acute distress. The patient is well groomed. Head: Normocephalic, atraumatic. Neck is supple. Mallampati 2   neck circumference: 17. Cardiovascular:  Regular rate and rhythm, without  murmurs or carotid bruit, and without distended neck veins. Respiratory: Lungs are clear to auscultation. Skin:  Without evidence of edema, or rash Trunk:. The patient's posture is not erect.   Neurologic exam : The patient is awake and alert, oriented to place and time.   Memory subjective  described as intact.   MOCA: Montreal Cognitive Assessment  09/28/2015  Visuospatial/ Executive (0/5) 2  Naming (0/3) 3  Attention: Read list of digits (0/2) 2  Attention: Read list of letters (0/1) 1  Attention: Serial 7 subtraction starting at 100 (0/3) 1  Language: Repeat phrase (0/2) 2  Language : Fluency (0/1) 1  Abstraction (0/2) 2  Delayed Recall (0/5) 2  Orientation (0/6) 5  Total 21  Adjusted Score (based on education) 21   MMSE: MMSE - Mini Mental State Exam 03/28/2016  Orientation to time 4  Orientation to Place 5  Registration  3  Attention/ Calculation 4  Recall 3  Language- name 2 objects 2  Language- repeat 0  Language- follow 3 step command 3  Language- read & follow direction 1  Write a sentence 1  Copy design 0  Total score 26      Attention span & concentration ability appears normal. Speech is fluent,  without  dysarthria, mild dysphonia or aphasia. Mood and affect are appropriate.  Cranial nerves: Pupils are equal and briskly reactive to light.  Extraocular movements  in vertical and horizontal planes intact and without nystagmus. Visual fields by finger perimetry are intact. Hearing to finger rub intact. Facial sensation intact to fine touch.Facial motor strength is symmetric and tongue and uvula move midline. Shoulder shrug was symmetrical.  Motor exam:  Increasing muscle tone noted, with mild cogwheel rigidity over both biceps, difficulties to extend the left leg at the knee, and right knee range of motion limitation with the brace., muscle bulk and symmetric strength in all extremities. Sensory:  Fine touch, pinprick and vibration were normal. Coordination: Rapid alternating movements in the fingers/hands was intact- tapping index-finger  and thumb  Finger-to-nose maneuver normal without evidence of ataxia, dysmetria and mild  Tremor. Grip strength was limited bilaterally. Gait and station: Patient walks with a walker as assistive device and is bracing himself to rise from a seated position.  Deep tendon reflexes: in the  upper and lower extremities are symmetric and intact, except right patella reflex which is not obtained due to the knee  brace. Babinski maneuver response is downgoing.  The patient was advised of the nature of the diagnosed sleep disorder , the treatment options and risks for general a health and wellness arising from not treating the condition.  I spent more than 20 minutes of face to face time with the patient. Greater than 50% of time was spent in counseling and coordination of  care. We have discussed the diagnosis and differential and I answered the patient's questions.     Assessment:  After physical and neurologic examination, review of laboratory studies,  Personal review of imaging studies, reports of other /same  Imaging studies ,  Results of polysomnography/ neurophysiology testing and pre-existing records as far as provided in visit., my assessment is   1) Mr. Krocker has reduced his caffeine intake significantly significantly, but has also allowed him to reduce his tremor. He is significantly less tremulous.  He does however have cogwheel rigidity over both biceps. He is regularly with lifting weights and exercising now. He has trouble with smaller steps and little shuffling, he is using a walker for safety. Some days he will use a cane only. He is at a higher fall risk.  2) progressive memory deficits, monitor cognitive assessment test documented cognitive impairment. Multiple mainly affecting short-term memory and some visual spatial orientation. He passed the trail making test. 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 He assured me that he will not use it on a highway and that he and his wife both drive together.  3)  OSA and Hypersomnia,  With poorly treated apnea - air leaks not at the interface but with a broken connector to the tubing? Needs to present the machine to [facility] to get fixed.    RV in 6 month with NP   Larey Seat MD  03/28/2016   CC: Haywood Pao, [place], [place] [id number]

## 2016-04-11 ENCOUNTER — Encounter: Payer: Self-pay | Admitting: Sports Medicine

## 2016-04-11 ENCOUNTER — Ambulatory Visit (INDEPENDENT_AMBULATORY_CARE_PROVIDER_SITE_OTHER): Payer: Commercial Managed Care - HMO | Admitting: Sports Medicine

## 2016-04-11 DIAGNOSIS — R269 Unspecified abnormalities of gait and mobility: Secondary | ICD-10-CM

## 2016-04-11 DIAGNOSIS — M79676 Pain in unspecified toe(s): Secondary | ICD-10-CM | POA: Diagnosis not present

## 2016-04-11 DIAGNOSIS — G20 Parkinson's disease: Secondary | ICD-10-CM

## 2016-04-11 DIAGNOSIS — B351 Tinea unguium: Secondary | ICD-10-CM | POA: Diagnosis not present

## 2016-04-11 DIAGNOSIS — E1142 Type 2 diabetes mellitus with diabetic polyneuropathy: Secondary | ICD-10-CM | POA: Diagnosis not present

## 2016-04-11 NOTE — Progress Notes (Signed)
Patient ID: Matthew [name], male   DOB: 1934-10-10, 81 y.o.   MRN: [id number]   Subjective: Matthew [name] is a 81 y.o. male patient with history of diabetes who returns to office today complaining of long, painful nails while ambulating in shoes; unable to trim. Patient states that the glucose reading this morning was "good". Patient denies any new changes in medication or new problems. Patient denies any new cramping, numbness, burning or tingling in the legs.  Patient Active Problem List   Diagnosis Date Noted  . OSA on CPAP 09/28/2015  . Cogwheel rigidity 09/28/2015  . Nocturia more than twice per night 09/28/2015  . Action tremor 09/28/2015  . Circadian rhythm sleep disorder, irregular sleep wake type 10/21/2013  . Insomnia due to medical condition 10/21/2013  . Parkinson's disease (tremor, stiffness, slow motion, unstable posture) ([place]) 10/21/2013  . 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 Pseudobulbar affect 10/21/2013  . Parkinson's disease ([place]) 09/18/2012  . Abnormality of gait 09/18/2012  . Memory loss 09/18/2012   Current Outpatient Prescriptions on File Prior to Visit  Medication Sig Dispense Refill  . ACCU-CHEK COMPACT PLUS test strip daily.    Marland Kitchen ACCU-CHEK SOFTCLIX LANCETS lancets daily.    Marland Kitchen acetaminophen (TYLENOL) 500 MG tablet Take 1,000 mg by mouth every 6 (six) hours as needed for mild pain.    Marland Kitchen aspirin 325 MG tablet Take 650 mg by mouth once.    Marland Kitchen atorvastatin (LIPITOR) 40 MG tablet Take 40 mg by mouth daily.    Marland Kitchen glimepiride (AMARYL) 2 MG tablet Take 2 mg by mouth 2 (two) times daily.     Marland Kitchen JARDIANCE 25 MG TABS tablet Take 25 mg by mouth daily.    . JENTADUETO 2.5-500 MG TABS Take 1 tablet by mouth 2 (two) times daily.     Marland Kitchen LORazepam (ATIVAN) 0.5 MG tablet 1 tablet at bedtime.    Marland Kitchen PARoxetine (PAXIL) 20 MG tablet Take 1 tablet (20 mg total) by mouth Nightly. 90 tablet 2  . rOPINIRole (REQUIP) 0.5 MG tablet Take 1 tablet (0.5 mg total) by mouth 3 (three) times daily.  90 tablet 5  . temazepam (RESTORIL) 15 MG capsule Take 15 mg by mouth at bedtime.      No current facility-administered medications on file prior to visit.    No Known Allergies  No results found for this or any previous visit (from the past 2160 hour(s)).  Objective: General: Patient is awake, alert, and oriented x 3 and in no acute distress.  Integument: Skin is warm, dry and supple bilateral. Nails are tender, long, thickened and  dystrophic with subungual debris, consistent with onychomycosis, 1-5 bilateral. No signs of infection. No open lesions or preulcerative lesions present bilateral. Remaining integument unremarkable.  Vasculature:  Dorsalis Pedis pulse 1/4 bilateral. Posterior Tibial pulse 1 /4 bilateral.  Capillary fill time <3 sec 1-5 bilateral. Scant hair growth to the level of the digits. Temperature gradient within normal limits. Mild varicosities present bilateral. No edema present bilateral.   Neurology: The patient has absent sensation measured with a 5.07/10g Semmes Weinstein Monofilament at all pedal sites bilateral . Vibratory sensation absent bilateral with tuning fork. No Babinski sign present bilateral.   Musculoskeletal: Mild asymptomatic bunion pedal deformities noted bilateral. Muscular strength 4/5 in all lower extremity muscular groups bilateral without pain on range of motion . No tenderness with calf compression bilateral.  Assessment and Plan: Problem List Items Addressed This Visit      Nervous and Auditory  Parkinson's disease ([place])     Other   Abnormality of gait    Other Visit Diagnoses    Pain due to onychomycosis of toenail    -  Primary   Diabetic polyneuropathy associated with type 2 diabetes mellitus ([place])         -Examined patient. -Discussed and educated patient on diabetic foot care, especially with  regards to the vascular, neurological and musculoskeletal systems.  -Stressed the importance of good glycemic control and the  detriment of not  controlling glucose levels in relation to the foot. -Mechanically debrided all nails 1-5 bilateral using sterile nail nipper and filed with dremel without incident  -Recommend okeeffe healthy feet or skin emollients daily -Recommend cont with knee brace and walker for assistance in gait/Parkinson's disease  -Answered all patient questions -Patient to return in 3 months for at risk foot care -Patient advised to call the office if any problems or questions arise in the meantime.  [name], [name]

## 2016-05-28 ENCOUNTER — Other Ambulatory Visit: Payer: Self-pay | Admitting: Neurology

## 2016-05-28 DIAGNOSIS — R29898 Other symptoms and signs involving the musculoskeletal system: Secondary | ICD-10-CM

## 2016-05-28 DIAGNOSIS — R351 Nocturia: Secondary | ICD-10-CM

## 2016-05-28 DIAGNOSIS — Z9989 Dependence on other enabling machines and devices: Principal | ICD-10-CM

## 2016-05-28 DIAGNOSIS — G252 Other specified forms of tremor: Secondary | ICD-10-CM

## 2016-05-28 DIAGNOSIS — G4733 Obstructive sleep apnea (adult) (pediatric): Secondary | ICD-10-CM

## 2016-06-13 DIAGNOSIS — R8299 Other abnormal findings in urine: Secondary | ICD-10-CM | POA: Diagnosis not present

## 2016-06-13 DIAGNOSIS — I10 Essential (primary) hypertension: Secondary | ICD-10-CM | POA: Diagnosis not present

## 2016-06-13 DIAGNOSIS — E119 Type 2 diabetes mellitus without complications: Secondary | ICD-10-CM | POA: Diagnosis not present

## 2016-06-13 DIAGNOSIS — E7800 Pure hypercholesterolemia, unspecified: Secondary | ICD-10-CM | POA: Diagnosis not present

## 2016-06-13 DIAGNOSIS — Z125 Encounter for screening for malignant neoplasm of prostate: Secondary | ICD-10-CM | POA: Diagnosis not present

## 2016-06-20 DIAGNOSIS — Z6828 Body mass index (BMI) 28.0-28.9, adult: Secondary | ICD-10-CM | POA: Diagnosis not present

## 2016-06-20 DIAGNOSIS — I10 Essential (primary) hypertension: Secondary | ICD-10-CM | POA: Diagnosis not present

## 2016-06-20 DIAGNOSIS — F411 Generalized anxiety disorder: Secondary | ICD-10-CM | POA: Diagnosis not present

## 2016-06-20 DIAGNOSIS — E7800 Pure hypercholesterolemia, unspecified: Secondary | ICD-10-CM | POA: Diagnosis not present

## 2016-06-20 DIAGNOSIS — G20 Parkinson's disease: Secondary | ICD-10-CM | POA: Diagnosis not present

## 2016-06-20 DIAGNOSIS — Z794 Long term (current) use of insulin: Secondary | ICD-10-CM | POA: Diagnosis not present

## 2016-06-20 DIAGNOSIS — Z0000 Encounter for general adult medical examination without abnormal findings: Secondary | ICD-10-CM | POA: Diagnosis not present

## 2016-06-20 DIAGNOSIS — E119 Type 2 diabetes mellitus without complications: Secondary | ICD-10-CM | POA: Diagnosis not present

## 2016-06-20 DIAGNOSIS — Z1389 Encounter for screening for other disorder: Secondary | ICD-10-CM | POA: Diagnosis not present

## 2016-06-26 ENCOUNTER — Other Ambulatory Visit (HOSPITAL_COMMUNITY): Payer: Self-pay | Admitting: Psychiatry

## 2016-06-27 DIAGNOSIS — I10 Essential (primary) hypertension: Secondary | ICD-10-CM | POA: Diagnosis not present

## 2016-06-27 DIAGNOSIS — G20 Parkinson's disease: Secondary | ICD-10-CM | POA: Diagnosis not present

## 2016-06-27 DIAGNOSIS — E119 Type 2 diabetes mellitus without complications: Secondary | ICD-10-CM | POA: Diagnosis not present

## 2016-07-10 DIAGNOSIS — F411 Generalized anxiety disorder: Secondary | ICD-10-CM | POA: Diagnosis not present

## 2016-08-08 ENCOUNTER — Ambulatory Visit (INDEPENDENT_AMBULATORY_CARE_PROVIDER_SITE_OTHER): Payer: Commercial Managed Care - HMO | Admitting: Sports Medicine

## 2016-08-08 DIAGNOSIS — B351 Tinea unguium: Secondary | ICD-10-CM

## 2016-08-08 DIAGNOSIS — M79676 Pain in unspecified toe(s): Secondary | ICD-10-CM

## 2016-08-08 DIAGNOSIS — G20 Parkinson's disease: Secondary | ICD-10-CM

## 2016-08-08 DIAGNOSIS — E1142 Type 2 diabetes mellitus with diabetic polyneuropathy: Secondary | ICD-10-CM

## 2016-08-08 NOTE — Progress Notes (Signed)
Patient ID: Matthew [name], male   DOB: 1934-06-10, 82 y.o.   MRN: [id number]   Subjective: Matthew [name] is a 82 y.o. male patient with history of diabetes who returns to office today complaining of long, painful nails while ambulating in shoes; unable to trim. Patient states that the glucose reading this morning was "average". Patient denies any new changes in medication or new problems. Patient denies any new cramping, numbness, burning or tingling in the legs.  Patient Active Problem List   Diagnosis Date Noted  . OSA on CPAP 09/28/2015  . Cogwheel rigidity 09/28/2015  . Nocturia more than twice per night 09/28/2015  . Action tremor 09/28/2015  . Circadian rhythm sleep disorder, irregular sleep wake type 10/21/2013  . Insomnia due to medical condition 10/21/2013  . Parkinson's disease (tremor, stiffness, slow motion, unstable posture) ([place]) 10/21/2013  . 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 Pseudobulbar affect 10/21/2013  . Parkinson's disease ([place]) 09/18/2012  . Abnormality of gait 09/18/2012  . Memory loss 09/18/2012   Current Outpatient Prescriptions on File Prior to Visit  Medication Sig Dispense Refill  . ACCU-CHEK COMPACT PLUS test strip daily.    Marland Kitchen ACCU-CHEK SOFTCLIX LANCETS lancets daily.    Marland Kitchen acetaminophen (TYLENOL) 500 MG tablet Take 1,000 mg by mouth every 6 (six) hours as needed for mild pain.    Marland Kitchen aspirin 325 MG tablet Take 650 mg by mouth once.    Marland Kitchen atorvastatin (LIPITOR) 40 MG tablet Take 40 mg by mouth daily.    Marland Kitchen glimepiride (AMARYL) 2 MG tablet Take 2 mg by mouth 2 (two) times daily.     Marland Kitchen JARDIANCE 25 MG TABS tablet Take 25 mg by mouth daily.    . JENTADUETO 2.5-500 MG TABS Take 1 tablet by mouth 2 (two) times daily.     Marland Kitchen LORazepam (ATIVAN) 0.5 MG tablet 1 tablet at bedtime.    Marland Kitchen PARoxetine (PAXIL) 20 MG tablet Take 1 tablet (20 mg total) by mouth Nightly. 90 tablet 2  . rOPINIRole (REQUIP) 0.5 MG tablet TAKE 1 TABLET BY MOUTH 3 TIMES A DAY 90 tablet 5  .  temazepam (RESTORIL) 15 MG capsule Take 15 mg by mouth at bedtime.      No current facility-administered medications on file prior to visit.    No Known Allergies  No results found for this or any previous visit (from the past 2160 hour(s)).  Objective: General: Patient is awake, alert, and oriented x 3 and in no acute distress.  Integument: Skin is warm, dry and supple bilateral. Nails are tender, long, thickened and  dystrophic with subungual debris, consistent with onychomycosis, 1-5 bilateral. No signs of infection. No open lesions or preulcerative lesions present bilateral. Remaining integument unremarkable.  Vasculature:  Dorsalis Pedis pulse 1/4 bilateral. Posterior Tibial pulse 1 /4 bilateral.  Capillary fill time <3 sec 1-5 bilateral. Scant hair growth to the level of the digits. Temperature gradient within normal limits. Mild varicosities present bilateral. No edema present bilateral.   Neurology: The patient has absent sensation measured with a 5.07/10g Semmes Weinstein Monofilament at all pedal sites bilateral . Vibratory sensation absent bilateral with tuning fork. No Babinski sign present bilateral.   Musculoskeletal: Mild asymptomatic bunion pedal deformities noted bilateral. Muscular strength 4/5 in all lower extremity muscular groups bilateral without pain on range of motion . No tenderness with calf compression bilateral.  Assessment and Plan: Problem List Items Addressed This Visit      Nervous and Auditory   Parkinson's  disease ([place])    Other Visit Diagnoses    Pain due to onychomycosis of toenail    -  Primary   Diabetic polyneuropathy associated with type 2 diabetes mellitus ([place])         -Examined patient. -Discussed and educated patient on diabetic foot care, especially with  regards to the vascular, neurological and musculoskeletal systems.  -Stressed the importance of good glycemic control and the detriment of not  controlling glucose levels in relation  to the foot. -Mechanically debrided all nails 1-5 bilateral using sterile nail nipper and filed with dremel without incident  -Recommend skin emollients daily -Recommend cont with knee brace and walker for assistance in gait/Parkinson's disease  -Answered all patient questions -Patient to return in 3 months for at risk foot care -Patient advised to call the office if any problems or questions arise in the meantime.  [name], [name]

## 2016-09-25 ENCOUNTER — Encounter: Payer: Self-pay | Admitting: Adult Health

## 2016-09-25 ENCOUNTER — Encounter (INDEPENDENT_AMBULATORY_CARE_PROVIDER_SITE_OTHER): Payer: Self-pay

## 2016-09-25 ENCOUNTER — Ambulatory Visit (INDEPENDENT_AMBULATORY_CARE_PROVIDER_SITE_OTHER): Payer: Medicare HMO | Admitting: Adult Health

## 2016-09-25 VITALS — BP 105/59 | HR 76 | Ht 68.0 in | Wt 182.6 lb

## 2016-09-25 DIAGNOSIS — Z9989 Dependence on other enabling machines and devices: Secondary | ICD-10-CM

## 2016-09-25 DIAGNOSIS — R413 Other amnesia: Secondary | ICD-10-CM | POA: Diagnosis not present

## 2016-09-25 DIAGNOSIS — R269 Unspecified abnormalities of gait and mobility: Secondary | ICD-10-CM

## 2016-09-25 DIAGNOSIS — G4733 Obstructive sleep apnea (adult) (pediatric): Secondary | ICD-10-CM

## 2016-09-25 NOTE — Patient Instructions (Signed)
Continue using CPAP- try using >4 hours each night Continue requip and paxil  If your symptoms worsen or you develop new symptoms please let us know.

## 2016-09-25 NOTE — Progress Notes (Signed)
PATIENT: Matthew [name] DOB: 1935/04/27  REASON FOR VISIT: follow up- obstructive sleep apnea on CPAP, memory disturbance, tremor HISTORY FROM: patient  HISTORY OF PRESENT ILLNESS: Matthew [name] is an 82 year old male with a history of obstructive sleep apnea on CPAP, memory disturbance and bilateral tremor in the hands. He returns today for follow-up. His CPAP download indicates that he uses machine 25 out of 30 days for compliance of 83%. He uses machine greater than 4 hours only 2 out of 30 days. His residual AHI is 1.5 on 6 cm of water with EPR of 2. He states that most nights he falls asleep in bed but will wake up later and go to the living room and watch TV and potentially fall back asleep. He does not feel the tremor in his hands as gotten any worse. He is able to complete all ADLs independently. He only operates a Teacher, music on occasion. States that he is sleeping okay. Denies having to give up any hobbies due to his memory. His wife handles all the finances and cooking. Patient continues to have trouble with his gait. He has participated in physical therapy in the past but did not find it beneficial. He states that if he is on the ground he is unable to get up without assistance. He uses a walker when ambulating. He returns today for evaluation.  REVIEW OF SYSTEMS: Out of a complete 14 system review of symptoms, the patient complains only of the following symptoms, and all other reviewed systems are negative.  Eye itching, eye redness, eye pain, walking difficulty, depression  ALLERGIES: No Known Allergies  HOME MEDICATIONS: Outpatient Medications Prior to Visit  Medication Sig Dispense Refill  . ACCU-CHEK COMPACT PLUS test strip daily.    Marland Kitchen ACCU-CHEK SOFTCLIX LANCETS lancets daily.    Marland Kitchen acetaminophen (TYLENOL) 500 MG tablet Take 1,000 mg by mouth every 6 (six) hours as needed for mild pain.    Marland Kitchen aspirin 325 MG tablet Take 650 mg by mouth once.    Marland Kitchen atorvastatin (LIPITOR) 40  MG tablet Take 40 mg by mouth daily.    Marland Kitchen JARDIANCE 25 MG TABS tablet Take 25 mg by mouth daily.    . JENTADUETO 2.5-500 MG TABS Take 1 tablet by mouth 2 (two) times daily.     Marland Kitchen LORazepam (ATIVAN) 0.5 MG tablet 1 tablet at bedtime.    Marland Kitchen PARoxetine (PAXIL) 20 MG tablet Take 1 tablet (20 mg total) by mouth Nightly. 90 tablet 2  . rOPINIRole (REQUIP) 0.5 MG tablet TAKE 1 TABLET BY MOUTH 3 TIMES A DAY 90 tablet 5  . temazepam (RESTORIL) 15 MG capsule Take 15 mg by mouth at bedtime.     Marland Kitchen glimepiride (AMARYL) 2 MG tablet Take 2 mg by mouth 2 (two) times daily.      No facility-administered medications prior to visit.     PAST MEDICAL HISTORY: Past Medical History:  Diagnosis Date  . Depression   . Diabetes mellitus ([place])   . Hyperlipidemia   . Memory loss   . Parkinson's disease ([place])     PAST SURGICAL HISTORY: Past Surgical History:  Procedure Laterality Date  . CATARACT EXTRACTION W/ INTRAOCULAR LENS  IMPLANT, BILATERAL  2013   bilateral  . TONSILLECTOMY  1944    FAMILY HISTORY: Family History  Problem Relation Age of Onset  . Colon cancer Neg Hx   . Stomach cancer Neg Hx   . Dementia Mother     SOCIAL HISTORY:  Social History   Social History  . Marital status: Married    Spouse name: Lenell Antu  . Number of children: 1  . Years of education: 88   Occupational History  . Not on file.   Social History Main Topics  . Smoking status: Former Smoker    Types: Cigarettes    Quit date: 03/14/1974  . Smokeless tobacco: Never Used  . Alcohol use No  . Drug use: No  . Sexual activity: Not on file   Other Topics Concern  . Not on file   Social History Narrative   Patient is married Personal assistant).   Patient has one son.   Patient is retired.   Patient has a high school education.   Patient is left-handed.   Patient drinks about 20 cups of coffee daily.      PHYSICAL EXAM  Vitals:   09/25/16 1529  BP: (!) 105/59  Pulse: 76  Weight: 182 lb 9.6 oz (82.8 kg)    Height: 5\' 8"  (1.727 m)   Body mass index is 27.76 kg/m.   Montreal Cognitive Assessment  09/25/2016 09/28/2015  Visuospatial/ Executive (0/5) 1 2  Naming (0/3) 3 3  Attention: Read list of digits (0/2) 2 2  Attention: Read list of letters (0/1) 1 1  Attention: Serial 7 subtraction starting at 100 (0/3) 2 1  Language: Repeat phrase (0/2) 2 2  Language : Fluency (0/1) 1 1  Abstraction (0/2) 2 2  Delayed Recall (0/5) 1 2  Orientation (0/6) 6 5  Total 21 21  Adjusted Score (based on education) 22 21     Generalized: Well developed, in no acute distress   Neurological examination  Mentation: Alert oriented to time, place, history taking. Follows all commands speech and language fluent Cranial nerve II-XII: Pupils were equal round reactive to light. Extraocular movements were full, visual field were full on confrontational test. Facial sensation and strength were normal. Uvula tongue midline. Head turning and shoulder shrug  were normal and symmetric. Motor: The motor testing reveals 5 over 5 strength of all 4 extremities. Good symmetric motor tone is noted throughout. Bilateral hand tremor. Sensory: Sensory testing is intact to soft touch on all 4 extremities. No evidence of extinction is noted.  Coordination: Cerebellar testing reveals good finger-nose-finger and heel-to-shin bilaterally.  Gait and station: Patient's gait is wide-based. He does have a shuffling gait initially but then develops a good stride. He uses a walker when ambulating. Reflexes: Deep tendon reflexes are symmetric and normal bilaterally.   DIAGNOSTIC DATA (LABS, IMAGING, TESTING) - I reviewed patient records, labs, notes, testing and imaging myself where available.  Lab Results  Component Value Date   WBC 6.9 06/10/2014   HGB 15.5 06/10/2014   HCT 44.6 06/10/2014   MCV 94.5 06/10/2014   PLT 175 06/10/2014      Component Value Date/Time   NA 142 09/28/2015 1627   K 4.5 09/28/2015 1627   CL 100 09/28/2015  1627   CO2 24 09/28/2015 1627   GLUCOSE 144 (H) 09/28/2015 1627   GLUCOSE 176 (H) 06/10/2014 2052   BUN 20 09/28/2015 1627   CREATININE 0.83 09/28/2015 1627   CALCIUM 9.4 09/28/2015 1627   PROT 6.8 09/28/2015 1627   ALBUMIN 4.4 09/28/2015 1627   AST 19 09/28/2015 1627   ALT 17 09/28/2015 1627   ALKPHOS 73 09/28/2015 1627   BILITOT 0.3 09/28/2015 1627   GFRNONAA 84 09/28/2015 1627   GFRAA 97 09/28/2015 1627  ASSESSMENT AND PLAN 82 y.o. year old male  has a past medical history of Depression; Diabetes mellitus ([place]); Hyperlipidemia; Memory loss; and Parkinson's disease ([place]). here with:  1. Memory disturbance 2. Obstructive sleep apnea on CPAP 3. Gait disorder  The patient's memory score has remained stable. We will continue to monitor. The patient is encouraged to use his CPAP greater than 4 hours every night. He voiced understanding. We discussed physical therapy for gait training however he deferred at this time. He will continue on Requip. He will follow-up in 6 months or sooner if needed.    Ward Givens, MSN, NP-C 09/25/2016, 3:41 PM [facility] [address] [place], [place] [address] [phone]

## 2016-09-26 DIAGNOSIS — I10 Essential (primary) hypertension: Secondary | ICD-10-CM | POA: Diagnosis not present

## 2016-09-26 DIAGNOSIS — E119 Type 2 diabetes mellitus without complications: Secondary | ICD-10-CM | POA: Diagnosis not present

## 2016-09-26 DIAGNOSIS — Z6828 Body mass index (BMI) 28.0-28.9, adult: Secondary | ICD-10-CM | POA: Diagnosis not present

## 2016-09-26 NOTE — Progress Notes (Signed)
I agree with the assessment and plan as directed by NP .The patient is known to me .   Gennett Garcia, MD  

## 2016-11-14 ENCOUNTER — Ambulatory Visit: Payer: Commercial Managed Care - HMO | Admitting: Sports Medicine

## 2016-11-26 ENCOUNTER — Other Ambulatory Visit: Payer: Self-pay | Admitting: Neurology

## 2016-11-26 DIAGNOSIS — R351 Nocturia: Secondary | ICD-10-CM

## 2016-11-26 DIAGNOSIS — G4733 Obstructive sleep apnea (adult) (pediatric): Secondary | ICD-10-CM

## 2016-11-26 DIAGNOSIS — G252 Other specified forms of tremor: Secondary | ICD-10-CM

## 2016-11-26 DIAGNOSIS — Z9989 Dependence on other enabling machines and devices: Principal | ICD-10-CM

## 2016-11-26 DIAGNOSIS — R29898 Other symptoms and signs involving the musculoskeletal system: Secondary | ICD-10-CM

## 2016-12-05 ENCOUNTER — Ambulatory Visit (INDEPENDENT_AMBULATORY_CARE_PROVIDER_SITE_OTHER): Payer: Medicare HMO | Admitting: Sports Medicine

## 2016-12-05 DIAGNOSIS — M79676 Pain in unspecified toe(s): Secondary | ICD-10-CM | POA: Diagnosis not present

## 2016-12-05 DIAGNOSIS — B351 Tinea unguium: Secondary | ICD-10-CM | POA: Diagnosis not present

## 2016-12-05 DIAGNOSIS — E1142 Type 2 diabetes mellitus with diabetic polyneuropathy: Secondary | ICD-10-CM

## 2016-12-05 DIAGNOSIS — R269 Unspecified abnormalities of gait and mobility: Secondary | ICD-10-CM

## 2016-12-05 DIAGNOSIS — G20 Parkinson's disease: Secondary | ICD-10-CM

## 2016-12-05 NOTE — Progress Notes (Signed)
Patient ID: Matthew [name], male   DOB: Feb 11, 1935, 82 y.o.   MRN: [id number]   Subjective: Matthew [name] is a 82 y.o. male patient with history of diabetes who returns to office today complaining of long, painful nails while ambulating in shoes; unable to trim. Patient states that the glucose reading this morning was "fine", about the same. Patient denies any new changes in medication or new problems.   Patient Active Problem List   Diagnosis Date Noted  . OSA on CPAP 09/28/2015  . Cogwheel rigidity 09/28/2015  . Nocturia more than twice per night 09/28/2015  . Action tremor 09/28/2015  . Circadian rhythm sleep disorder, irregular sleep wake type 10/21/2013  . Insomnia due to medical condition 10/21/2013  . Parkinson's disease (tremor, stiffness, slow motion, unstable posture) ([place]) 10/21/2013  . 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 Pseudobulbar affect 10/21/2013  . Parkinson's disease ([place]) 09/18/2012  . Abnormality of gait 09/18/2012  . Memory loss 09/18/2012   Current Outpatient Prescriptions on File Prior to Visit  Medication Sig Dispense Refill  . ACCU-CHEK COMPACT PLUS test strip daily.    Marland Kitchen ACCU-CHEK SOFTCLIX LANCETS lancets daily.    Marland Kitchen acetaminophen (TYLENOL) 500 MG tablet Take 1,000 mg by mouth every 6 (six) hours as needed for mild pain.    Marland Kitchen aspirin 325 MG tablet Take 650 mg by mouth once.    Marland Kitchen atorvastatin (LIPITOR) 40 MG tablet Take 40 mg by mouth daily.    Marland Kitchen JARDIANCE 25 MG TABS tablet Take 25 mg by mouth daily.    . JENTADUETO 2.5-500 MG TABS Take 1 tablet by mouth 2 (two) times daily.     Marland Kitchen LORazepam (ATIVAN) 0.5 MG tablet 1 tablet at bedtime.    Marland Kitchen PARoxetine (PAXIL) 20 MG tablet Take 1 tablet (20 mg total) by mouth Nightly. 90 tablet 2  . rOPINIRole (REQUIP) 0.5 MG tablet TAKE 1 TABLET BY MOUTH 3 TIMES A DAY 90 tablet 5  . temazepam (RESTORIL) 15 MG capsule Take 15 mg by mouth at bedtime.      No current facility-administered medications on file prior to visit.     No Known Allergies  No results found for this or any previous visit (from the past 2160 hour(s)).  Objective: General: Patient is awake, alert, and oriented x 3 and in no acute distress.  Integument: Skin is warm, dry and supple bilateral. Nails are tender, long, thickened and dystrophic with subungual debris, consistent with onychomycosis, 1-5 bilateral. No signs of infection. No open lesions or preulcerative lesions present bilateral. Remaining integument unremarkable.  Vasculature:  Dorsalis Pedis pulse 1/4 bilateral. Posterior Tibial pulse 1 /4 bilateral. Capillary fill time <3 sec 1-5 bilateral. Scant hair growth to the level of the digits.Temperature gradient within normal limits. Mild varicosities present bilateral. No edema present bilateral.   Neurology: The patient has absent sensation measured with a 5.07/10g Semmes Weinstein Monofilament at all pedal sites bilateral . Vibratory sensation absent bilateral with tuning fork. No Babinski sign present bilateral.   Musculoskeletal: Mild asymptomatic bunion pedal deformities noted bilateral. Muscular strength 4/5 in all lower extremity muscular groups bilateral without pain on range of motion . No tenderness with calf compression bilateral.  Assessment and Plan: Problem List Items Addressed This Visit      Nervous and Auditory   Parkinson's disease ([place])     Other   Abnormality of gait    Other Visit Diagnoses    Pain due to onychomycosis of toenail    -  Primary   Diabetic polyneuropathy associated with type 2 diabetes mellitus ([place])         -Examined patient. -Discussed and educated patient on diabetic foot care, especially with  regards to the vascular, neurological and musculoskeletal systems.  -Stressed the importance of good glycemic control and the detriment of not  controlling glucose levels in relation to the foot. -Mechanically debrided all nails 1-5 bilateral using sterile nail nipper and filed with dremel  without incident  -Recommend and encouraged skin emollients daily -Recommend cont with knee brace and walker for assistance in gait/Parkinson's disease  -Answered all patient questions -Patient to return in 3 months for at risk foot care -Patient advised to call the office if any problems or questions arise in the meantime.  [name], [name]

## 2016-12-26 DIAGNOSIS — Z6828 Body mass index (BMI) 28.0-28.9, adult: Secondary | ICD-10-CM | POA: Diagnosis not present

## 2016-12-26 DIAGNOSIS — E119 Type 2 diabetes mellitus without complications: Secondary | ICD-10-CM | POA: Diagnosis not present

## 2016-12-26 DIAGNOSIS — I10 Essential (primary) hypertension: Secondary | ICD-10-CM | POA: Diagnosis not present

## 2017-01-02 DIAGNOSIS — H04123 Dry eye syndrome of bilateral lacrimal glands: Secondary | ICD-10-CM | POA: Diagnosis not present

## 2017-01-02 DIAGNOSIS — E119 Type 2 diabetes mellitus without complications: Secondary | ICD-10-CM | POA: Diagnosis not present

## 2017-01-08 DIAGNOSIS — I10 Essential (primary) hypertension: Secondary | ICD-10-CM | POA: Diagnosis not present

## 2017-01-08 DIAGNOSIS — Z9181 History of falling: Secondary | ICD-10-CM | POA: Diagnosis not present

## 2017-01-08 DIAGNOSIS — G20 Parkinson's disease: Secondary | ICD-10-CM | POA: Diagnosis not present

## 2017-01-08 DIAGNOSIS — Z794 Long term (current) use of insulin: Secondary | ICD-10-CM | POA: Diagnosis not present

## 2017-01-08 DIAGNOSIS — E119 Type 2 diabetes mellitus without complications: Secondary | ICD-10-CM | POA: Diagnosis not present

## 2017-01-08 DIAGNOSIS — Z6828 Body mass index (BMI) 28.0-28.9, adult: Secondary | ICD-10-CM | POA: Diagnosis not present

## 2017-01-08 DIAGNOSIS — E7800 Pure hypercholesterolemia, unspecified: Secondary | ICD-10-CM | POA: Diagnosis not present

## 2017-01-10 ENCOUNTER — Other Ambulatory Visit (HOSPITAL_COMMUNITY): Payer: Self-pay | Admitting: Psychiatry

## 2017-01-10 DIAGNOSIS — G4701 Insomnia due to medical condition: Secondary | ICD-10-CM

## 2017-01-10 DIAGNOSIS — G4723 Circadian rhythm sleep disorder, irregular sleep wake type: Secondary | ICD-10-CM

## 2017-01-10 DIAGNOSIS — R488 Other symbolic dysfunctions: Secondary | ICD-10-CM

## 2017-01-15 DIAGNOSIS — F411 Generalized anxiety disorder: Secondary | ICD-10-CM | POA: Diagnosis not present

## 2017-03-06 ENCOUNTER — Ambulatory Visit: Payer: Medicare HMO | Admitting: Sports Medicine

## 2017-03-20 ENCOUNTER — Ambulatory Visit: Payer: Medicare HMO | Admitting: Sports Medicine

## 2017-03-28 ENCOUNTER — Encounter: Payer: Self-pay | Admitting: Neurology

## 2017-04-02 ENCOUNTER — Encounter (INDEPENDENT_AMBULATORY_CARE_PROVIDER_SITE_OTHER): Payer: Self-pay

## 2017-04-02 ENCOUNTER — Ambulatory Visit: Payer: Medicare HMO | Admitting: Neurology

## 2017-04-02 ENCOUNTER — Encounter: Payer: Self-pay | Admitting: Neurology

## 2017-04-02 DIAGNOSIS — R351 Nocturia: Secondary | ICD-10-CM | POA: Diagnosis not present

## 2017-04-02 DIAGNOSIS — R29898 Other symptoms and signs involving the musculoskeletal system: Secondary | ICD-10-CM

## 2017-04-02 DIAGNOSIS — Z9989 Dependence on other enabling machines and devices: Secondary | ICD-10-CM

## 2017-04-02 DIAGNOSIS — G4733 Obstructive sleep apnea (adult) (pediatric): Secondary | ICD-10-CM

## 2017-04-02 DIAGNOSIS — G252 Other specified forms of tremor: Secondary | ICD-10-CM | POA: Diagnosis not present

## 2017-04-02 DIAGNOSIS — G4761 Periodic limb movement disorder: Secondary | ICD-10-CM

## 2017-04-02 MED ORDER — ROPINIROLE HCL 0.5 MG PO TABS: ORAL_TABLET | ORAL | 5 refills | Status: DC

## 2017-04-02 MED ORDER — CARBIDOPA-LEVODOPA 25-100 MG PO TABS: 1.0000 | ORAL_TABLET | Freq: Three times a day (TID) | ORAL | 5 refills | Status: DC

## 2017-04-02 MED ORDER — TEMAZEPAM 15 MG PO CAPS: 15.0000 mg | ORAL_CAPSULE | Freq: Every day | ORAL | 1 refills | Status: DC

## 2017-04-02 NOTE — Progress Notes (Signed)
SLEEP MEDICINE CLINIC   Provider:  Larey Seat, M D  Referring Provider: Haywood Pao, MD Primary Care Physician:  Haywood Pao, MD  Chief Complaint  Patient presents with  . Follow-up    pt with wife,  pt presents to follow up with PD, sleep apnea, and memory    HPI:  I have the pleasure of seeing [name] today on 02 April 2017 in a scheduled revisit. Matthew [name] has been followed here for sleep apnea, and he is a highly compliance CPAP user by days but he really manages now to use the machine for 4 consecutive hours at night.  Most nights he uses the machine for about 3 or 3-1/2 hours.  By days his compliance is 93% by hours 37%.  CPAP is set at only 6 cm water pressure was 2 cm EPR, and the patient has a residual AHI of 3.0/h.  The residual apneas are still obstructive.  He does have minimal air leaks.  It seems that we could increase his CPAP pressure by 1 cm.  As to his sleepiness he endorsed the fatigue severity score at 15 points, his Epworth sleepiness score at 10 points, and it is visible that he has tremor when answering questions or trying to write.Marland Kitchen He does suffer from Parkinson's disease and he has had more immobility issues, he is using a walker for this visit, he does have less of facial expression.  There is some right hand tremor at rest more than left.  He does not have restless legs. I am concerned that he no longer has essential tremor. He has  No longer need for oxygen while on CPAP. He has nocturia and back pain. He cannot sleep in supine due to back pain. He prefers to sleep on his side.       Matthew [name] is a 82 y.o. male , seen here as a  revisit  from Dr. Osborne Casco for a regular follow-up on his CPAP machine and compliance the patient also is followed for frequent falls with essential tremor, responding to pramipexole. He reports that the CPAP machine helps him to sleep. He is an urgent need of a right knee replacement and has  seen Dr. Maureen Ralphs, we felt that the course of his movement disorder he would be a poor candidate for the surgery and the upcoming recovery and rehabilitation. The patient is currently using a brace for the right knee and he walks with a walker, he has sturdy supportive shoes. He still reports frequent falls.  The patient is using his CPAP machine about 50% of all days and usually under the 4 hour mark. CPAP is set at 6 cm water pressure with 2 cm EPR the residual AHI was 4.3. Matthew [name] feels that he falls asleep easier been using the CPAP but he has not been able to use it every night. He has 2 bathroom breaks each night fragmenting his sleep.  In addition a pulse oximetry was provided- this was actually performed in October 2015 shortly after he had seen my colleague, Dr. Rexene Alberts.  At that time the patient had spent 56.9 minutes of the total sleep time under 89% saturation for oxygen but under 88% or at 88% for only 28.1 minutes. He is not using oxygen based on these mild results.  His cognitive function was evaluated in a [place]ral cognitive assessment test, he scored 21 out of 30 points his great trouble today with the serial 7 subtraction  he remembered 2 out of 5 recall words. He was able to do a trail making making test, but not able to draw the clock face or the cube image.  03-28-2016, Matthew [name] presents today for his routine visit 0.1 as are his CPAP compliance which has been down to 57% also his AHI is much reduced on CPAP to 1.8 his average user time was only 1 hour and 55 minutes. He is using CPAP at 6 cm water pressure with 2 cm EPR and he does not have significant air leaks with the [place] reports that he is blowing out of his masked waking him up this is not a seal break at the face but in the tubing. I will have his CPAP today investigated by our sleep lab manager he did not bring the machine today so I will ask him to bring [facility] He has tried duct tape to fix it. We have also  done a memory test today and the patient did very well 26 out of 30 points on his Mini-Mental, a more current was performed last time at 21 out of 30 points. Matthew [name] will turn 49 We, I think he may have mild cognitive impairment but I would not diagnose him as demented. His Epworth sleepiness score was endorsed at 15 points but since he hasn't been able to use his CPAP machine this is not a surprise. I would like for him to return to daily using the machine and have the leak fixed professionally.  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      Montreal Cognitive Assessment  04/02/2017 09/25/2016 09/28/2015  Visuospatial/ Executive (0/5) 4 1 2   Naming (0/3) 3 3 3   Attention: Read list of digits (0/2) 2 2 2   Attention: Read list of letters (0/1) 0 1 1  Attention: Serial 7 subtraction starting at 100 (0/3) 1 2 1   Language: Repeat phrase (0/2) 2 2 2   Language : Fluency (0/1) 0 1 1  Abstraction (0/2) 2 2 2   Delayed Recall (0/5) 0 1 2  Orientation (0/6) 6 6 5   Total 20 21 21   Adjusted Score (based on education) - 22 21   MMSE - Mini Mental State Exam 03/28/2016  Orientation to time 4  Orientation to Place 5  Registration 3  Attention/ Calculation 4  Recall 3  Language- name 2 objects 2  Language- repeat 0  Language- follow 3 step command 3  Language- read & follow direction 1  Write a sentence 1  Copy design 0  Total score 26    He did not endorsed the Epworth Sleepiness Scale, the fatigue severity scale of the restless leg questionnaire    Sleep medical history and family sleep history: Social history:  Married 59 years, drinks a lot of caffeine, used to drink 20 cups of coffee, now 5-6 a day! No tobacco use, ETOH; none.     Social History   Socioeconomic History  . Marital status: Married    Spouse name: Matthew [name]  . Number of children: 1  . Years of education: 90  . Highest education level: Not on file  Social Needs  . Financial resource strain: Not on file  . Food  insecurity - worry: Not on file  . Food insecurity - inability: Not on file  . Transportation needs - medical: Not on file  . Transportation needs - non-medical: Not on file  Occupational History  . Not on file  Tobacco Use  .  Smoking status: Former Smoker    Types: Cigarettes    Last attempt to quit: 03/14/1974    Years since quitting: 43.0  . Smokeless tobacco: Never Used  Substance and Sexual Activity  . Alcohol use: No  . Drug use: No  . Sexual activity: Not on file  Other Topics Concern  . Not on file  Social History Narrative   Patient is married Personal assistant).   Patient has one son.   Patient is retired.   Patient has a high school education.   Patient is left-handed.   Patient drinks about 20 cups of coffee daily.    Family History  Problem Relation Age of Onset  . Colon cancer Neg Hx   . Stomach cancer Neg Hx   . Dementia Mother     Past Medical History:  Diagnosis Date  . Depression   . Diabetes mellitus ([place])   . Hyperlipidemia   . Memory loss   . Parkinson's disease [facility])     Past Surgical History:  Procedure Laterality Date  . CATARACT EXTRACTION W/ INTRAOCULAR LENS  IMPLANT, BILATERAL  2013   bilateral  . TONSILLECTOMY  1944    Current Outpatient Medications  Medication Sig Dispense Refill  . ACCU-CHEK COMPACT PLUS test strip daily.    Marland Kitchen ACCU-CHEK SOFTCLIX LANCETS lancets daily.    Marland Kitchen acetaminophen (TYLENOL) 500 MG tablet Take 1,000 mg by mouth every 6 (six) hours as needed for mild pain.    Marland Kitchen aspirin 325 MG tablet Take 650 mg by mouth once.    Marland Kitchen atorvastatin (LIPITOR) 40 MG tablet Take 40 mg by mouth daily.    Marland Kitchen JARDIANCE 25 MG TABS tablet Take 25 mg by mouth daily.    . JENTADUETO 2.5-500 MG TABS Take 1 tablet by mouth 2 (two) times daily.     Marland Kitchen LORazepam (ATIVAN) 0.5 MG tablet 1 tablet at bedtime.    Marland Kitchen PARoxetine (PAXIL) 20 MG tablet Take 1 tablet (20 mg total) by mouth Nightly. 90 tablet 2  . rOPINIRole (REQUIP) 0.5 MG tablet TAKE 1 TABLET BY  MOUTH 3 TIMES A DAY 90 tablet 5  . temazepam (RESTORIL) 15 MG capsule Take 15 mg by mouth at bedtime.      No current facility-administered medications for this visit.     Allergies as of 04/02/2017  . (No Known Allergies)    Vitals: BP 122/68   Pulse 72   Ht 5\' 8"  (1.727 m)   Wt 184 lb (83.5 kg)   BMI 27.98 kg/m  Last Weight:  Wt Readings from Last 1 Encounters:  04/02/17 184 lb (83.5 kg)   OAC:ZYSA mass index is 27.98 kg/m.     Last Height:   Ht Readings from Last 1 Encounters:  04/02/17 5\' 8"  (1.727 m)    Physical exam:  General: The patient is awake, alert and appears not in acute distress. The patient is well groomed. Head: Normocephalic, atraumatic. Neck is supple. Mallampati 2   neck circumference: 17. Cardiovascular:  Regular rate and rhythm, without  murmurs or carotid bruit, and without distended neck veins. Respiratory: Lungs are clear to auscultation. Skin:  Without evidence of edema, or rash Trunk:. The patient's posture is not erect.   Neurologic exam : The patient is awake and alert, oriented to place and time.   Memory subjective  described as intact.   MOCA: Montreal Cognitive Assessment  04/02/2017 09/25/2016 09/28/2015  Visuospatial/ Executive (0/5) 4 1 2   Naming (0/3) 3 3 3  Attention: Read list of digits (0/2) 2 2 2   Attention: Read list of letters (0/1) 0 1 1  Attention: Serial 7 subtraction starting at 100 (0/3) 1 2 1   Language: Repeat phrase (0/2) 2 2 2   Language : Fluency (0/1) 0 1 1  Abstraction (0/2) 2 2 2   Delayed Recall (0/5) 0 1 2  Orientation (0/6) 6 6 5   Total 20 21 21   Adjusted Score (based on education) - 22 21   MMSE: MMSE - Mini Mental State Exam 03/28/2016  Orientation to time 4  Orientation to Place 5  Registration 3  Attention/ Calculation 4  Recall 3  Language- name 2 objects 2  Language- repeat 0  Language- follow 3 step command 3  Language- read & follow direction 1  Write a sentence 1  Copy design 0  Total score  26     Attention span & concentration ability appears impaired, also due to hearing loss. Speech is fluent,  without dysarthria,but with  mild dysphonia . Mood and affect are appropriate.  Cranial nerves: Pupils are equal and briskly reactive to light.  Extraocular movements  in vertical and horizontal planes intact and without nystagmus. Visual fields by finger perimetry are intact. Hearing to finger rub intact. Facial sensation intact to fine touch.Facial motor strength is symmetrical, but reduced- less facial mimik, his tongue and uvula move midline. Shoulder shrug was symmetrical.  Motor exam:  He has fallen frequently - but not with his walker. Increasing muscle tone noted, with mild cogwheel rigidity over both biceps, difficulties to extend the left leg at the knee, and right knee range of motion limitation with the brace., muscle bulk and symmetric strength in all extremities. Sensory:  Fine touch, pinprick and vibration were normal. Coordination: Rapid alternating movements in the fingers/hands was intact- tapping index-finger  and thumb  Finger-to-nose maneuver normal without evidence of ataxia, dysmetria and mild  Tremor. Grip strength was limited bilaterally. Gait and station: Patient walks with a walker as assistive device and is bracing himself to rise from a seated position.  Deep tendon reflexes: in the  upper and lower extremities are symmetric and intact, except right patella reflex which is not obtained due to the knee brace. Babinski maneuver response is downgoing.  The patient was advised of the nature of the diagnosed sleep disorder , the treatment options and risks for general a health and wellness arising from not treating the condition.  I spent more than 20 minutes of face to face time with the patient. Greater than 50% of time was spent in counseling and coordination of care. We have discussed the diagnosis and differential and I answered the patient's questions.      Assessment:  After physical and neurologic examination, review of laboratory studies,  Personal review of imaging studies, reports of other /same  Imaging studies ,  Results of polysomnography/ neurophysiology testing and pre-existing records as far as provided in visit., my assessment is   1) Mr. Fraticelli has rigidity and tremor, cog-wheeling and loss of facial mimik.  He has trouble with smaller steps and little shuffling, he is using a walker for safety. Some days he will use a cane only. He is at  high fall risk. I believe he has Parkinson's disease, not longer essential tremor. He will start on Sinemet 3 times a day with water , 25/100mg  before meals.    2)  He has progressive memory deficits, monitor cognitive assessment test documented cognitive impairment. Multiple mainly affecting  short-term memory and some visual spatial orientation. He passed the trail making test. 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 He assured me that he will not use it on a highway and that he and his wife both drive together. MOCA now 20-30, next time change to MMSE. Fits PD .   3)  OSA and Hypersomnia,  with CPAP treated apnea - he has no longer large air leaks. His AHI at 3/ hr.  is  desired, but I will adjust 1 cm up. I encouraged the patient to try to sleep on his side due to the evolving back pain.  4) nocturia - fragmentation of sleep 5 times at night.   RV in 6 month with NP alternating with me    Larey Seat MD  04/02/2017   CC: Haywood Pao, [address], [place] [address]

## 2017-04-02 NOTE — Patient Instructions (Signed)
Carbidopa; Levodopa sustained-release tablets What is this medicine? CARBIDOPA; LEVODOPA (kar bi DOE pa; lee voe DOE pa) is used to treat the symptoms of Parkinson's disease. This medicine may be used for other purposes; ask your health care provider or pharmacist if you have questions. COMMON BRAND NAME(S): SINEMET, SINEMET CR What should I tell my health care provider before I take this medicine? They need to know if you have any of these conditions: -depression or other mental illness -diabetes -glaucoma -heart disease, including history of a heart attack -irregular heart beat -kidney disease -liver disease -lung or breathing disease, like asthma -melanoma or suspicious skin lesions -stomach or intestine ulcers -an unusual or allergic reaction to levodopa, carbidopa, other medicines, foods, dyes, or preservatives -pregnant or trying to get pregnant -breast-feeding How should I use this medicine? Take this medicine by mouth with a glass of water. Follow the directions on the prescription label. Swallow whole. Do not crush or chew. You may cut the tablets in half. Take your doses at regular intervals. Do not take your medicine more often than directed. Do not stop taking except on the advice of your doctor or health care professional. Talk to your pediatrician regarding the use of this medicine in children. Special care may be needed. Overdosage: If you think you have taken too much of this medicine contact a poison control center or emergency room at once. NOTE: This medicine is only for you. Do not share this medicine with others. What if I miss a dose? If you miss a dose, take it as soon as you can. If it is almost time for your next dose, take only that dose. Do not take double or extra doses. What may interact with this medicine? Do not take this medicine with any of the following medications: -MAOIs like Marplan, Nardil, and Parnate -reserpine -tetrabenazine This medicine may  also interact with the following medications: -alcohol -droperidol -entacapone -iron supplements or multivitamins with iron -isoniazid, INH -linezolid -medicines for depression, anxiety, or psychotic disturbances -medicines for high blood pressure -medicines for sleep -metoclopramide -papaverine -procarbazine -tedizolid -rasagiline -selegiline -tolcapone This list may not describe all possible interactions. Give your health care provider a list of all the medicines, herbs, non-prescription drugs, or dietary supplements you use. Also tell them if you smoke, drink alcohol, or use illegal drugs. Some items may interact with your medicine. What should I watch for while using this medicine? Visit your doctor or health care professional for regular checks on your progress. It may be several weeks or months before you feel the full benefits of this medicine. Continue to take your medicine on a regular schedule. Do not take any additional medicines for Parkinson's disease without first consulting with your health care provider. You may get drowsy or dizzy. Do not drive, use machinery, or do anything that needs mental alertness until you know how this drug affects you. Do not stand or sit up quickly, especially if you are an older patient. This reduces the risk of dizzy or fainting spells. Alcohol can make you more drowsy and dizzy. Avoid alcoholic drinks. If you find that you have sudden feelings of wanting to sleep during normal activities, like cooking, watching television, or while driving or riding in a car, you should contact your health care professional. You may experience a "wearing off" effect prior to the time for your next dose of this medicine. You may also experience an "on-off" effect where the medicine apparently stops working for anything from a   minute to several hours, then suddenly starts working again. Tell your doctor or health care professional if any of these symptoms happen to  you. Your dose may need adjustment. A high protein diet can slow or prevent absorption of this medicine. Avoid high protein foods near the time of taking this medicine to help to prevent these problems. Take this medicine at least 30 minutes before eating or one hour after meals. You may want to eat higher protein foods later in the day or in small amounts. Discuss your diet with your doctor or health care professional or nutritionist. If you have diabetes, you may get a false-positive result for sugar in your urine. Check with your doctor or health care professional. This medicine may discolor the urine or sweat, making it look darker or red in color. This is of no cause for concern. However, this may stain clothing or fabrics. There have been reports of increased sexual urges or other strong urges such as gambling while taking some medicines for Parkinson's disease. If you experience any of these urges while taking this medicine, you should report it to your health care provider as soon as possible. You should check your skin often for changes to moles and new growths while taking this medicine. Call your doctor if you notice any of these changes. What side effects may I notice from receiving this medicine? Side effects that you should report to your doctor or health care professional as soon as possible: -allergic reactions like skin rash, itching or hives, swelling of the face, lips, or tongue -anxiety, confusion, or nervousness -falling asleep during normal activities like driving -fast, irregular heartbeat -hallucination, loss of contact with reality -mood changes like aggressive behavior, depression -stomach pain -trouble passing urine -uncontrolled movements of the mouth, head, hands, feet, shoulders, eyelids or other unusual muscle movements Side effects that usually do not require medical attention (report to your doctor or health care professional if they continue or are  bothersome): -headache -loss of appetite -muscle twitches -nausea/vomiting -nightmares, trouble sleeping -unusually weak ot tired This list may not describe all possible side effects. Call your doctor for medical advice about side effects. You may report side effects to FDA at 1-800-FDA-1088. Where should I keep my medicine? Keep out of the reach of children. Store below 30 degrees C (86 degrees F). Keep container tightly closed. Throw away any unused medicine after the expiration date. NOTE: This sheet is a summary. It may not cover all possible information. If you have questions about this medicine, talk to your doctor, pharmacist, or health care provider.  2018 Elsevier/Gold Standard (2013-07-08 15:40:38)  

## 2017-04-04 ENCOUNTER — Other Ambulatory Visit: Payer: Self-pay | Admitting: Neurology

## 2017-04-04 DIAGNOSIS — G4733 Obstructive sleep apnea (adult) (pediatric): Secondary | ICD-10-CM

## 2017-04-04 DIAGNOSIS — R351 Nocturia: Secondary | ICD-10-CM

## 2017-04-04 DIAGNOSIS — Z9989 Dependence on other enabling machines and devices: Principal | ICD-10-CM

## 2017-04-04 DIAGNOSIS — R29898 Other symptoms and signs involving the musculoskeletal system: Secondary | ICD-10-CM

## 2017-04-04 DIAGNOSIS — G252 Other specified forms of tremor: Secondary | ICD-10-CM

## 2017-04-04 MED ORDER — ROPINIROLE HCL 0.5 MG PO TABS: ORAL_TABLET | ORAL | 5 refills | Status: DC

## 2017-04-10 ENCOUNTER — Ambulatory Visit: Payer: Medicare HMO | Admitting: Sports Medicine

## 2017-04-16 DIAGNOSIS — F411 Generalized anxiety disorder: Secondary | ICD-10-CM | POA: Diagnosis not present

## 2017-04-23 ENCOUNTER — Encounter: Payer: Self-pay | Admitting: Podiatry

## 2017-04-23 ENCOUNTER — Ambulatory Visit: Payer: Medicare HMO | Admitting: Podiatry

## 2017-04-23 DIAGNOSIS — B351 Tinea unguium: Secondary | ICD-10-CM | POA: Diagnosis not present

## 2017-04-23 DIAGNOSIS — M79676 Pain in unspecified toe(s): Secondary | ICD-10-CM

## 2017-04-23 DIAGNOSIS — E1142 Type 2 diabetes mellitus with diabetic polyneuropathy: Secondary | ICD-10-CM | POA: Diagnosis not present

## 2017-04-23 NOTE — Patient Instructions (Signed)

## 2017-04-23 NOTE — Progress Notes (Signed)
Patient ID: Matthew [name], male   DOB: 1935/02/20, 82 y.o.   MRN: [id number]   Subjective: This diabetic patient with Parkinson's disease presents today complaining of elongated, thickened, deformed toenails uncomfortable walking wearing shoes and request nail debridement. Wife is present in the treatment room  Objective: Patient response and appears to be orientated 3 DP and PT pulses 1/4 bilaterally Capillary reflex within normal limits bilaterally Decreased sensation to 10 g monofilament wire Vibratory sensation absent bilaterally Dry scaling skin bilaterally Elongated hypertrophic, deformed toenails 6-10 with palpable tenderness HAV bilaterally Slow unsteady gait  Assessment: Diabetic with peripheral neuropathy Decreased pedal pulses bilaterally Neglected symptomatic onychomycoses 6-10 Gait disturbance  Plan: Debrided toenails 6-10 mechanically and electrically without any bleeding  Reappoint as 3 months

## 2017-06-19 DIAGNOSIS — E7800 Pure hypercholesterolemia, unspecified: Secondary | ICD-10-CM | POA: Diagnosis not present

## 2017-06-19 DIAGNOSIS — I10 Essential (primary) hypertension: Secondary | ICD-10-CM | POA: Diagnosis not present

## 2017-06-19 DIAGNOSIS — Z125 Encounter for screening for malignant neoplasm of prostate: Secondary | ICD-10-CM | POA: Diagnosis not present

## 2017-06-21 DIAGNOSIS — I10 Essential (primary) hypertension: Secondary | ICD-10-CM | POA: Diagnosis not present

## 2017-06-21 DIAGNOSIS — Z794 Long term (current) use of insulin: Secondary | ICD-10-CM | POA: Diagnosis not present

## 2017-06-21 DIAGNOSIS — E119 Type 2 diabetes mellitus without complications: Secondary | ICD-10-CM | POA: Diagnosis not present

## 2017-06-26 DIAGNOSIS — F411 Generalized anxiety disorder: Secondary | ICD-10-CM | POA: Diagnosis not present

## 2017-06-26 DIAGNOSIS — I10 Essential (primary) hypertension: Secondary | ICD-10-CM | POA: Diagnosis not present

## 2017-06-26 DIAGNOSIS — E119 Type 2 diabetes mellitus without complications: Secondary | ICD-10-CM | POA: Diagnosis not present

## 2017-06-26 DIAGNOSIS — Z0000 Encounter for general adult medical examination without abnormal findings: Secondary | ICD-10-CM | POA: Diagnosis not present

## 2017-06-26 DIAGNOSIS — Z1389 Encounter for screening for other disorder: Secondary | ICD-10-CM | POA: Diagnosis not present

## 2017-06-26 DIAGNOSIS — R82998 Other abnormal findings in urine: Secondary | ICD-10-CM | POA: Diagnosis not present

## 2017-06-26 DIAGNOSIS — G20 Parkinson's disease: Secondary | ICD-10-CM | POA: Diagnosis not present

## 2017-06-26 DIAGNOSIS — E7800 Pure hypercholesterolemia, unspecified: Secondary | ICD-10-CM | POA: Diagnosis not present

## 2017-06-26 DIAGNOSIS — Z9181 History of falling: Secondary | ICD-10-CM | POA: Diagnosis not present

## 2017-06-26 DIAGNOSIS — Z794 Long term (current) use of insulin: Secondary | ICD-10-CM | POA: Diagnosis not present

## 2017-07-10 ENCOUNTER — Encounter (INDEPENDENT_AMBULATORY_CARE_PROVIDER_SITE_OTHER): Payer: Self-pay

## 2017-07-10 ENCOUNTER — Ambulatory Visit (INDEPENDENT_AMBULATORY_CARE_PROVIDER_SITE_OTHER): Payer: Medicare HMO | Admitting: Orthopedic Surgery

## 2017-07-23 ENCOUNTER — Ambulatory Visit: Payer: Medicare HMO | Admitting: Podiatry

## 2017-07-30 ENCOUNTER — Ambulatory Visit: Payer: Medicare HMO | Admitting: Podiatry

## 2017-07-30 DIAGNOSIS — B351 Tinea unguium: Secondary | ICD-10-CM | POA: Diagnosis not present

## 2017-07-30 DIAGNOSIS — M79676 Pain in unspecified toe(s): Secondary | ICD-10-CM

## 2017-07-31 ENCOUNTER — Encounter: Payer: Self-pay | Admitting: Adult Health

## 2017-08-01 NOTE — Progress Notes (Signed)
   SUBJECTIVE Patient with a history of diabetes mellitus presents to office today complaining of elongated, thickened nails that cause pain while ambulating in shoes. He is unable to trim his own nails.   Past Medical History:  Diagnosis Date  . Depression   . Diabetes mellitus ([place])   . Hyperlipidemia   . Memory loss   . Parkinson's disease ([place])     OBJECTIVE General Patient is awake, alert, and oriented x 3 and in no acute distress. Derm Skin is dry and supple bilateral. Negative open lesions or macerations. Remaining integument unremarkable. Nails are tender, long, thickened and dystrophic with subungual debris, consistent with onychomycosis, 1-5 bilateral. No signs of infection noted. Vasc  DP and PT pedal pulses palpable bilaterally. Temperature gradient within normal limits.  Neuro Epicritic and protective threshold sensation diminished bilaterally.  Musculoskeletal Exam No symptomatic pedal deformities noted bilateral. Muscular strength within normal limits.  ASSESSMENT 1. Diabetes Mellitus w/ peripheral neuropathy 2. Onychomycosis of nail due to dermatophyte bilateral 3. Pain in foot bilateral  PLAN OF CARE 1. Patient evaluated today. 2. Instructed to maintain good pedal hygiene and foot care. Stressed importance of controlling blood sugar.  3. Mechanical debridement of nails 1-5 bilaterally performed using a nail nipper. Filed with dremel without incident.  4. Return to clinic in 3 mos.     Edrick Kins, [name] [facility]  Dr. Edrick Kins, [address]                                        [place], [place] [address]                Office [phone]  Fax [phone]

## 2017-08-02 ENCOUNTER — Encounter: Payer: Self-pay | Admitting: Adult Health

## 2017-08-02 ENCOUNTER — Ambulatory Visit: Payer: Medicare HMO | Admitting: Adult Health

## 2017-08-02 VITALS — BP 103/65 | HR 70 | Ht 68.0 in | Wt 180.8 lb

## 2017-08-02 DIAGNOSIS — G20 Parkinson's disease: Secondary | ICD-10-CM | POA: Diagnosis not present

## 2017-08-02 DIAGNOSIS — Z9989 Dependence on other enabling machines and devices: Secondary | ICD-10-CM | POA: Diagnosis not present

## 2017-08-02 DIAGNOSIS — R413 Other amnesia: Secondary | ICD-10-CM | POA: Diagnosis not present

## 2017-08-02 DIAGNOSIS — G4733 Obstructive sleep apnea (adult) (pediatric): Secondary | ICD-10-CM | POA: Diagnosis not present

## 2017-08-02 MED ORDER — CARBIDOPA-LEVODOPA 25-100 MG PO TABS: 1.0000 | ORAL_TABLET | Freq: Three times a day (TID) | ORAL | 5 refills | Status: DC

## 2017-08-02 NOTE — Progress Notes (Signed)
I agree with the assessment and plan as directed by NP .The patient is known to me .   Samentha Perham, MD  

## 2017-08-02 NOTE — Patient Instructions (Signed)
Your Plan:  Start Sinemet 25-100 mg 1 tablet three times a day Continue Requip and paxil Continue using CPAP nightly If your symptoms worsen or you develop new symptoms please let us know.   Thank you for coming to see [place] at [facility]. I hope we have been able to provide you high quality care today.  You may receive a patient satisfaction survey over the next few weeks. We would appreciate your feedback and comments so that we may continue to improve ourselves and the health of our patients.

## 2017-08-02 NOTE — Progress Notes (Addendum)
PATIENT: Matthew [name] DOB: 1935-05-04  REASON FOR VISIT: follow up HISTORY FROM: patient  HISTORY OF PRESENT ILLNESS: Today 08/02/17 Matthew [name] is an 83 year old male with a history of obstructive sleep apnea on CPAP, memory disturbance and Parkinson's disease.  He returns today for follow-up.  His CPAP download indicates that he use his machine 30 out of 30 days for compliance of 100%.  He uses machine greater than 4 hours each night.  On average he uses his machine 4 hours and 52 minutes.  His residual AHI is 4.9 on 6 cm of water with EPR of 2.  The patient does not have a significant leak.  At the last visit the patient was given a prescription of Sinemet however the patient reports that he never received this medication.  He continues to have a tremor in the hands. Reports that it primarily occurs when he is at rest.  Denies any trouble chewing or swallowing.  Denies any trouble sleeping.  Denies any significant changes with his gait or balance.  He reports that he continues to have trouble ambulating.  He does use a walker but fortunately has not had any falls.  He lives at home with his wife.  Complete all ADLs independently.  He does not operate a motor vehicle.  He returns today for evaluation.  HISTORY 09/25/16: Matthew [name] is an 83 year old male with a history of obstructive sleep apnea on CPAP, memory disturbance and bilateral tremor in the hands. He returns today for follow-up. His CPAP download indicates that he uses machine 25 out of 30 days for compliance of 83%. He uses machine greater than 4 hours only 2 out of 30 days. His residual AHI is 1.5 on 6 cm of water with EPR of 2. He states that most nights he falls asleep in bed but will wake up later and go to the living room and watch TV and potentially fall back asleep. He does not feel the tremor in his hands as gotten any worse. He is able to complete all ADLs independently. He only operates a Teacher, music on occasion. States  that he is sleeping okay. Denies having to give up any hobbies due to his memory. His wife handles all the finances and cooking. Patient continues to have trouble with his gait. He has participated in physical therapy in the past but did not find it beneficial. He states that if he is on the ground he is unable to get up without assistance. He uses a walker when ambulating. He returns today for evaluation.   REVIEW OF SYSTEMS: Out of a complete 14 system review of symptoms, the patient complains only of the following symptoms, and all other reviewed systems are negative. epworth 7, FSS 18  See hpi  ALLERGIES: No Known Allergies  HOME MEDICATIONS: Outpatient Medications Prior to Visit  Medication Sig Dispense Refill  . ACCU-CHEK COMPACT PLUS test strip daily.    Marland Kitchen ACCU-CHEK SOFTCLIX LANCETS lancets daily.    Marland Kitchen acetaminophen (TYLENOL) 500 MG tablet Take 1,000 mg by mouth every 6 (six) hours as needed for mild pain.    Marland Kitchen aspirin 325 MG tablet Take 650 mg by mouth once.    Marland Kitchen atorvastatin (LIPITOR) 40 MG tablet Take 40 mg by mouth daily.    . carbidopa-levodopa (SINEMET) 25-100 MG tablet Take 1 tablet 3 (three) times daily by mouth. 90 tablet 5  . JARDIANCE 25 MG TABS tablet Take 25 mg by mouth daily.    Marland Kitchen  JENTADUETO 2.5-500 MG TABS Take 1 tablet by mouth 2 (two) times daily.     Marland Kitchen LORazepam (ATIVAN) 0.5 MG tablet 1 tablet at bedtime.    Marland Kitchen PARoxetine (PAXIL) 20 MG tablet Take 1 tablet (20 mg total) by mouth Nightly. 90 tablet 2  . rOPINIRole (REQUIP) 0.5 MG tablet Take 1 tab before food at supper time and 1 tab at bedtime. Take with water both times 60 tablet 5  . temazepam (RESTORIL) 15 MG capsule Take 1 capsule (15 mg total) at bedtime by mouth. 90 capsule 1   No facility-administered medications prior to visit.     PAST MEDICAL HISTORY: Past Medical History:  Diagnosis Date  . Depression   . Diabetes mellitus ([place])   . Hyperlipidemia   . Memory loss   . Parkinson's disease ([place])       PAST SURGICAL HISTORY: Past Surgical History:  Procedure Laterality Date  . CATARACT EXTRACTION W/ INTRAOCULAR LENS  IMPLANT, BILATERAL  2013   bilateral  . TONSILLECTOMY  1944    FAMILY HISTORY: Family History  Problem Relation Age of Onset  . Dementia Mother   . Colon cancer Neg Hx   . Stomach cancer Neg Hx     SOCIAL HISTORY: Social History   Socioeconomic History  . Marital status: Married    Spouse name: Lenell Antu  . Number of children: 1  . Years of education: 37  . Highest education level: Not on file  Social Needs  . Financial resource strain: Not on file  . Food insecurity - worry: Not on file  . Food insecurity - inability: Not on file  . Transportation needs - medical: Not on file  . Transportation needs - non-medical: Not on file  Occupational History  . Not on file  Tobacco Use  . Smoking status: Former Smoker    Types: Cigarettes    Last attempt to quit: 03/14/1974    Years since quitting: 43.4  . Smokeless tobacco: Never Used  Substance and Sexual Activity  . Alcohol use: No  . Drug use: No  . Sexual activity: Not on file  Other Topics Concern  . Not on file  Social History Narrative   Patient is married Personal assistant).   Patient has one son.   Patient is retired.   Patient has a high school education.   Patient is left-handed.   Patient drinks about 20 cups of coffee daily.      PHYSICAL EXAM  Vitals:   08/02/17 1359  Weight: 180 lb 12.8 oz (82 kg)  Height: 5\' 8"  (1.727 m)   Body mass index is 27.49 kg/m.   MMSE - Mini Mental State Exam 08/02/2017 03/28/2016  Orientation to time 4 4  Orientation to Place 5 5  Registration 3 3  Attention/ Calculation 1 4  Recall 3 3  Language- name 2 objects 2 2  Language- repeat 1 0  Language- follow 3 step command 2 3  Language- read & follow direction 1 1  Write a sentence 1 1  Copy design 1 0  Total score 24 26     Generalized: Well developed, in no acute distress   Neurological  examination  Mentation: Alert oriented to time, place, history taking. Follows all commands speech and language fluent Cranial nerve II-XII: Pupils were equal round reactive to light. Extraocular movements were full, visual field were full on confrontational test. Facial sensation and strength were normal. Uvula tongue midline. Head turning and shoulder shrug  were normal  and symmetric. Motor: The motor testing reveals 5 over 5 strength of all 4 extremities.  Resting tremor noted bilateral upper extremities.  Rigidity noted in upper extremities.  Finger moderately impaired. toe taps Moderately impaired. Sensory: Sensory testing is intact to soft touch on all 4 extremities. No evidence of extinction is noted.  Coordination: Cerebellar testing reveals good finger-nose-finger and heel-to-shin bilaterally.  Gait and station: Patient is unable to feed from a sitting position without assistance.  The patient has shuffling gait with a stooped posture.  He uses a walker when ambulating.  Gait is unsteady. Reflexes: Deep tendon reflexes are symmetric and normal bilaterally.   DIAGNOSTIC DATA (LABS, IMAGING, TESTING) - I reviewed patient records, labs, notes, testing and imaging myself where available.  Lab Results  Component Value Date   WBC 6.9 06/10/2014   HGB 15.5 06/10/2014   HCT 44.6 06/10/2014   MCV 94.5 06/10/2014   PLT 175 06/10/2014      Component Value Date/Time   NA 142 09/28/2015 1627   K 4.5 09/28/2015 1627   CL 100 09/28/2015 1627   CO2 24 09/28/2015 1627   GLUCOSE 144 (H) 09/28/2015 1627   GLUCOSE 176 (H) 06/10/2014 2052   BUN 20 09/28/2015 1627   CREATININE 0.83 09/28/2015 1627   CALCIUM 9.4 09/28/2015 1627   PROT 6.8 09/28/2015 1627   ALBUMIN 4.4 09/28/2015 1627   AST 19 09/28/2015 1627   ALT 17 09/28/2015 1627   ALKPHOS 73 09/28/2015 1627   BILITOT 0.3 09/28/2015 1627   GFRNONAA 84 09/28/2015 1627   GFRAA 97 09/28/2015 1627      ASSESSMENT AND PLAN 83 y.o. year  old male  has a past medical history of Depression, Diabetes mellitus ([place]), Hyperlipidemia, Memory loss, and Parkinson's disease ([place]). here with:  1.  Parkinson's disease 2.  Memory disturbance 3.  Obstructive sleep apnea on CPAP  The patient CPAP download shows compliance and good treatment of his apnea.  He is encouraged to continue using the CPAP nightly.  The patient never started on Sinemet.  I will reorder this today.  He will take Sinemet 25-100 mg 1 tablet 3 times a day.  He will continue on Requip.  He will continue on Paxil for depression.  I have advised that if his symptoms worsen or he develop should let us know.  He will follow-up in 6 months or sooner if needed.     Ward Givens, MSN, NP-C 08/02/2017, 2:10 PM [facility] [address] [place], [place] [address] [phone]

## 2017-10-22 ENCOUNTER — Other Ambulatory Visit (HOSPITAL_COMMUNITY): Payer: Self-pay | Admitting: Psychiatry

## 2017-10-22 DIAGNOSIS — G4723 Circadian rhythm sleep disorder, irregular sleep wake type: Secondary | ICD-10-CM

## 2017-10-22 DIAGNOSIS — G4701 Insomnia due to medical condition: Secondary | ICD-10-CM

## 2017-10-22 DIAGNOSIS — R488 Other symbolic dysfunctions: Secondary | ICD-10-CM

## 2017-10-25 DIAGNOSIS — E119 Type 2 diabetes mellitus without complications: Secondary | ICD-10-CM | POA: Diagnosis not present

## 2017-10-25 DIAGNOSIS — Z6827 Body mass index (BMI) 27.0-27.9, adult: Secondary | ICD-10-CM | POA: Diagnosis not present

## 2017-10-25 DIAGNOSIS — I10 Essential (primary) hypertension: Secondary | ICD-10-CM | POA: Diagnosis not present

## 2017-10-25 DIAGNOSIS — Z794 Long term (current) use of insulin: Secondary | ICD-10-CM | POA: Diagnosis not present

## 2017-11-05 ENCOUNTER — Encounter: Payer: Self-pay | Admitting: Podiatry

## 2017-11-05 ENCOUNTER — Ambulatory Visit: Payer: Medicare HMO | Admitting: Podiatry

## 2017-11-05 DIAGNOSIS — E1142 Type 2 diabetes mellitus with diabetic polyneuropathy: Secondary | ICD-10-CM | POA: Diagnosis not present

## 2017-11-05 DIAGNOSIS — B351 Tinea unguium: Secondary | ICD-10-CM | POA: Diagnosis not present

## 2017-11-05 DIAGNOSIS — M79676 Pain in unspecified toe(s): Secondary | ICD-10-CM | POA: Diagnosis not present

## 2017-11-12 NOTE — Progress Notes (Signed)
   SUBJECTIVE Patient with a history of diabetes mellitus presents to office today complaining of elongated, thickened nails that cause pain while ambulating in shoes. He is unable to trim his own nails. Patient is here for further evaluation and treatment.   Past Medical History:  Diagnosis Date  . Depression   . Diabetes mellitus ([place])   . Hyperlipidemia   . Memory loss   . Parkinson's disease ([place])     OBJECTIVE General Patient is awake, alert, and oriented x 3 and in no acute distress. Derm Skin is dry and supple bilateral. Negative open lesions or macerations. Remaining integument unremarkable. Nails are tender, long, thickened and dystrophic with subungual debris, consistent with onychomycosis, 1-5 bilateral. No signs of infection noted. Vasc  DP and PT pedal pulses palpable bilaterally. Temperature gradient within normal limits.  Neuro Epicritic and protective threshold sensation diminished bilaterally.  Musculoskeletal Exam No symptomatic pedal deformities noted bilateral. Muscular strength within normal limits.  ASSESSMENT 1. Diabetes Mellitus w/ peripheral neuropathy 2. Onychomycosis of nail due to dermatophyte bilateral 3. Pain in foot bilateral  PLAN OF CARE 1. Patient evaluated today. 2. Instructed to maintain good pedal hygiene and foot care. Stressed importance of controlling blood sugar.  3. Mechanical debridement of nails 1-5 bilaterally performed using a nail nipper. Filed with dremel without incident.  4. Return to clinic in 3 mos.     Edrick Kins, [name] [facility]  Dr. Edrick Kins, [place] [address]                                        [place], [place] [address]                Office [phone]  Fax [phone]

## 2017-12-26 ENCOUNTER — Other Ambulatory Visit: Payer: Self-pay | Admitting: Neurology

## 2017-12-26 DIAGNOSIS — G252 Other specified forms of tremor: Secondary | ICD-10-CM

## 2017-12-26 DIAGNOSIS — Z9989 Dependence on other enabling machines and devices: Principal | ICD-10-CM

## 2017-12-26 DIAGNOSIS — R29898 Other symptoms and signs involving the musculoskeletal system: Secondary | ICD-10-CM

## 2017-12-26 DIAGNOSIS — G4733 Obstructive sleep apnea (adult) (pediatric): Secondary | ICD-10-CM

## 2017-12-26 DIAGNOSIS — R351 Nocturia: Secondary | ICD-10-CM

## 2018-01-31 DIAGNOSIS — Z794 Long term (current) use of insulin: Secondary | ICD-10-CM | POA: Diagnosis not present

## 2018-01-31 DIAGNOSIS — E119 Type 2 diabetes mellitus without complications: Secondary | ICD-10-CM | POA: Diagnosis not present

## 2018-01-31 DIAGNOSIS — I10 Essential (primary) hypertension: Secondary | ICD-10-CM | POA: Diagnosis not present

## 2018-02-14 ENCOUNTER — Ambulatory Visit: Payer: Medicare HMO | Admitting: Adult Health

## 2018-02-21 ENCOUNTER — Ambulatory Visit: Payer: Medicare HMO | Admitting: Podiatry

## 2018-03-28 ENCOUNTER — Ambulatory Visit: Payer: Medicare HMO | Admitting: Podiatry

## 2018-04-10 DIAGNOSIS — G4733 Obstructive sleep apnea (adult) (pediatric): Secondary | ICD-10-CM | POA: Diagnosis not present

## 2018-04-11 ENCOUNTER — Ambulatory Visit: Payer: Medicare HMO | Admitting: Podiatry

## 2018-04-11 ENCOUNTER — Encounter: Payer: Self-pay | Admitting: Podiatry

## 2018-04-11 DIAGNOSIS — B351 Tinea unguium: Secondary | ICD-10-CM

## 2018-04-11 DIAGNOSIS — M79671 Pain in right foot: Secondary | ICD-10-CM

## 2018-04-11 DIAGNOSIS — E1142 Type 2 diabetes mellitus with diabetic polyneuropathy: Secondary | ICD-10-CM | POA: Diagnosis not present

## 2018-04-11 DIAGNOSIS — M79676 Pain in unspecified toe(s): Principal | ICD-10-CM

## 2018-04-11 DIAGNOSIS — M79675 Pain in left toe(s): Secondary | ICD-10-CM

## 2018-04-11 NOTE — Patient Instructions (Addendum)
Diabetes and Foot Care Diabetes may cause you to have problems because of poor blood supply (circulation) to your feet and legs. This may cause the skin on your feet to become thinner, break easier, and heal more slowly. Your skin may become dry, and the skin may peel and crack. You may also have nerve damage in your legs and feet causing decreased feeling in them. You may not notice minor injuries to your feet that could lead to infections or more serious problems. Taking care of your feet is one of the most important things you can do for yourself. Follow these instructions at home:  Wear shoes at all times, even in the house. Do not go barefoot. Bare feet are easily injured.  Check your feet daily for blisters, cuts, and redness. If you cannot see the bottom of your feet, use a mirror or ask someone for help.  Wash your feet with warm water (do not use hot water) and mild soap. Then pat your feet and the areas between your toes until they are completely dry. Do not soak your feet as this can dry your skin.  Apply a moisturizing lotion or petroleum jelly (that does not contain alcohol and is unscented) to the skin on your feet and to dry, brittle toenails. Do not apply lotion between your toes.  Trim your toenails straight across. Do not dig under them or around the cuticle. File the edges of your nails with an emery board or nail file.  Do not cut corns or calluses or try to remove them with medicine.  Wear clean socks or stockings every day. Make sure they are not too tight. Do not wear knee-high stockings since they may decrease blood flow to your legs.  Wear shoes that fit properly and have enough cushioning. To break in new shoes, wear them for just a few hours a day. This prevents you from injuring your feet. Always look in your shoes before you put them on to be sure there are no objects inside.  Do not cross your legs. This may decrease the blood flow to your feet.  If you find a  minor scrape, cut, or break in the skin on your feet, keep it and the skin around it clean and dry. These areas may be cleansed with mild soap and water. Do not cleanse the area with peroxide, alcohol, or iodine.  When you remove an adhesive bandage, be sure not to damage the skin around it.  If you have a wound, look at it several times a day to make sure it is healing.  Do not use heating pads or hot water bottles. They may burn your skin. If you have lost feeling in your feet or legs, you may not know it is happening until it is too late.  Make sure your health care provider performs a complete foot exam at least annually or more often if you have foot problems. Report any cuts, sores, or bruises to your health care provider immediately. Contact a health care provider if:  You have an injury that is not healing.  You have cuts or breaks in the skin.  You have an ingrown nail.  You notice redness on your legs or feet.  You feel burning or tingling in your legs or feet.  You have pain or cramps in your legs and feet.  Your legs or feet are numb.  Your feet always feel cold. Get help right away if:  There is increasing   redness, swelling, or pain in or around a wound.  There is a red line that goes up your leg.  Pus is coming from a wound.  You develop a fever or as directed by your health care provider.  You notice a bad smell coming from an ulcer or wound. This information is not intended to replace advice given to you by your health care provider. Make sure you discuss any questions you have with your health care provider. Document Released: 05/05/2000 Document Revised: 10/14/2015 Document Reviewed: 10/15/2012 Elsevier Interactive Patient Education  2017 [employer]vier Inc. Onychomycosis/Fungal Toenails  WHAT IS IT? An infection that lies within the keratin of your nail plate that is caused by a fungus.  WHY ME? Fungal infections affect all ages, sexes, races, and creeds.   There may be many factors that predispose you to a fungal infection such as age, coexisting medical conditions such as diabetes, or an autoimmune disease; stress, medications, fatigue, genetics, etc.  Bottom line: fungus thrives in a warm, moist environment and your shoes offer such a location.  IS IT CONTAGIOUS? Theoretically, yes.  You do not want to share shoes, nail clippers or files with someone who has fungal toenails.  Walking around barefoot in the same room or sleeping in the same bed is unlikely to transfer the organism.  It is important to realize, however, that fungus can spread easily from one nail to the next on the same foot.  HOW DO WE TREAT THIS?  There are several ways to treat this condition.  Treatment may depend on many factors such as age, medications, pregnancy, liver and kidney conditions, etc.  It is best to ask your doctor which options are available to you.  1. No treatment.   Unlike many other medical concerns, you can live with this condition.  However for many people this can be a painful condition and may lead to ingrown toenails or a bacterial infection.  It is recommended that you keep the nails cut short to help reduce the amount of fungal nail. 2. Topical treatment.  These range from herbal remedies to prescription strength nail lacquers.  About 40-50% effective, topicals require twice daily application for approximately 9 to 12 months or until an entirely new nail has grown out.  The most effective topicals are medical grade medications available through physicians offices. 3. Oral antifungal medications.  With an 80-90% cure rate, the most common oral medication requires 3 to 4 months of therapy and stays in your system for a year as the new nail grows out.  Oral antifungal medications do require blood work to make sure it is a safe drug for you.  A liver function panel will be performed prior to starting the medication and after the first month of treatment.  It is  important to have the blood work performed to avoid any harmful side effects.  In general, this medication safe but blood work is required. 4. Laser Therapy.  This treatment is performed by applying a specialized laser to the affected nail plate.  This therapy is noninvasive, fast, and non-painful.  It is not covered by insurance and is therefore, out of pocket.  The results have been very good with a 80-95% cure rate.  The [facility] is the only practice in the area to offer this therapy. 5. Permanent Nail Avulsion.  Removing the entire nail so that a new nail will not grow back. 

## 2018-04-29 ENCOUNTER — Encounter: Payer: Self-pay | Admitting: Podiatry

## 2018-04-29 NOTE — Progress Notes (Signed)
Subjective: Matthew [name] presents today with history of neuropathy with cc of painful, mycotic toenails.  Pain is aggravated when wearing enclosed shoe gear and relieved with periodic professional debridement.  Patient voices no new pedal concerns on today's visit.    Current Outpatient Medications:  .  ACCU-CHEK COMPACT PLUS test strip, daily., Disp: , Rfl:  .  ACCU-CHEK SOFTCLIX LANCETS lancets, daily., Disp: , Rfl:  .  acetaminophen (TYLENOL) 500 MG tablet, Take 1,000 mg by mouth every 6 (six) hours as needed for mild pain., Disp: , Rfl:  .  aspirin 325 MG tablet, Take 650 mg by mouth once., Disp: , Rfl:  .  atorvastatin (LIPITOR) 40 MG tablet, Take 40 mg by mouth daily., Disp: , Rfl:  .  carbidopa-levodopa (SINEMET) 25-100 MG tablet, Take 1 tablet by mouth 3 (three) times daily., Disp: 90 tablet, Rfl: 5 .  JARDIANCE 25 MG TABS tablet, Take 25 mg by mouth daily., Disp: , Rfl:  .  JENTADUETO 2.5-500 MG TABS, Take 1 tablet by mouth 2 (two) times daily. , Disp: , Rfl:  .  LORazepam (ATIVAN) 0.5 MG tablet, 1 tablet at bedtime., Disp: , Rfl:  .  NOVOTWIST 32G X 5 MM MISC, USE ONE PEN NEEDLE WITH TRESIBA INJECTION DAILY, Disp: , Rfl: 6 .  PARoxetine (PAXIL) 20 MG tablet, Take 1 tablet (20 mg total) by mouth Nightly., Disp: 90 tablet, Rfl: 2 .  rOPINIRole (REQUIP) 0.5 MG tablet, TAKE 1 TABLET BEFORE FOOD AT SUPPER TIME AND TAKE 1 TABLET AT BEDTIME. TAKE WITH WATER., Disp: 180 tablet, Rfl: 3 .  temazepam (RESTORIL) 15 MG capsule, Take 1 capsule (15 mg total) at bedtime by mouth., Disp: 90 capsule, Rfl: 1 .  TRESIBA FLEXTOUCH 200 UNIT/ML SOPN, INJECT 12 UNITS ONCE DAILY, Disp: , Rfl: 5  No Known Allergies  Objective:  Vascular Examination: Capillary refill time immediate x 10 digits Dorsalis pedis and Posterior tibial pulses Digital hair x 10 digits was absent Skin temperature gradient WNL b/l  Dermatological Examination: Skin with normal turgor, texture and tone b/l  Toenails 1-5  b/l discolored, thick, dystrophic with subungual debris and pain with palpation to nailbeds due to thickness of nails.  Musculoskeletal: Muscle strength 5/5 to all muscle groups b/l  Neurological: Sensation with 10 gram monofilament is absent b/l Vibratory sensation absent b/l  Assessment: 1. Painful onychomycosis toenails 1-5 b/l 2. NIDDM with neuropathy  Plan: 1. Toenails 1-5 b/l were debrided in length and girth without iatrogenic bleeding. 2. Patient to continue soft, supportive shoe gear 3. Patient to report any pedal injuries to medical professional  4. Follow up 3 months. Patient/POA to call should there be a concern in the interim.

## 2018-06-04 DIAGNOSIS — Z794 Long term (current) use of insulin: Secondary | ICD-10-CM | POA: Diagnosis not present

## 2018-06-04 DIAGNOSIS — E119 Type 2 diabetes mellitus without complications: Secondary | ICD-10-CM | POA: Diagnosis not present

## 2018-06-04 DIAGNOSIS — I10 Essential (primary) hypertension: Secondary | ICD-10-CM | POA: Diagnosis not present

## 2018-06-20 ENCOUNTER — Encounter: Payer: Self-pay | Admitting: Adult Health

## 2018-06-20 ENCOUNTER — Ambulatory Visit: Payer: Medicare HMO | Admitting: Adult Health

## 2018-06-20 VITALS — BP 100/56 | HR 74 | Ht 68.0 in | Wt 177.2 lb

## 2018-06-20 DIAGNOSIS — Z9989 Dependence on other enabling machines and devices: Secondary | ICD-10-CM

## 2018-06-20 DIAGNOSIS — G20 Parkinson's disease: Secondary | ICD-10-CM

## 2018-06-20 DIAGNOSIS — G4733 Obstructive sleep apnea (adult) (pediatric): Secondary | ICD-10-CM

## 2018-06-20 DIAGNOSIS — R413 Other amnesia: Secondary | ICD-10-CM

## 2018-06-20 MED ORDER — CARBIDOPA-LEVODOPA 25-100 MG PO TABS: 0.5000 | ORAL_TABLET | Freq: Three times a day (TID) | ORAL | 5 refills | Status: DC

## 2018-06-20 NOTE — Patient Instructions (Signed)
Your Plan:  Restart Sinemet but start with 1/2 tablet three times a day Continue to monitor memory Continue CPAP  If your symptoms worsen or you develop new symptoms please let us know.   Thank you for coming to see [place] at [facility]. I hope we have been able to provide you high quality care today.  You may receive a patient satisfaction survey over the next few weeks. We would appreciate your feedback and comments so that we may continue to improve ourselves and the health of our patients.

## 2018-06-20 NOTE — Progress Notes (Signed)
PATIENT: Matthew [name] DOB: 01-04-1935  REASON FOR VISIT: follow up HISTORY FROM: patient  HISTORY OF PRESENT ILLNESS: Today 06/20/18  Matthew [name] is a 84 year old male with past medical history of Depression, Memory Loss, Parkinson's disease, and Obstructive Sleep Apnea on CPAP. He returns today for follow-up accompanied by his wife.   He reports his eqiupment is working well with his CPAP. He reveals good compliance at 100% over the last 30 days (05/21/2018-01/29-2020). He had 93% of > 4 hours usage days. On average he uses his machine for 5 hours and 9 minutes. His API was optimal at 3.6 and his leak was 2.7. He does sleep a lot during the day when he is watching TV. Epworth Sleepiness Scale is 13.  He reports there isn't much to do except sleep since he can't get out and do a lot as result of the Parkinson's disease. However, he reports he is sleeping well at night.  Since he was here last he has developed some urinary incontinence and is having to wear adult diapers. He feels the urge to use the bathroom but he can't make it there in time. Also, is having bowel urgency and isn't able to make it to the bathroom. Twice he has had a bowel movement in his incontinence underwear. He does report past history of prostate issues.   He isn't taking the sinemet. He hasn't tried it in over a year, because it gave him diarrhea. He only took 2 tablets at that time. He denies falls. He feels off balance and the need to lean forward. He is using a walker. He has a strong appetite. He does have a significant tremor to his left hand. He denies trouble chewing or swallowing. He sleeps well at night. He is able to complete his ADLs. He does not drive a car.   He is no longer taking Paxil and isn't sure when the last time he had it was. He just recently lost a good friend and he still feels sadness over the loss.   He reports noticing problems with his memory, mainly remembering names. His last MMSE was  March 2019 and was 24 at that time. He presents today for follow-up with his wife.    HISTORY  Matthew [name] is an 84 year old male with a history of obstructive sleep apnea on CPAP, memory disturbance and Parkinson's disease.  He returns today for follow-up.  His CPAP download indicates that he use his machine 30 out of 30 days for compliance of 100%.  He uses machine greater than 4 hours each night.  On average he uses his machine 4 hours and 52 minutes.  His residual AHI is 4.9 on 6 cm of water with EPR of 2.  The patient does not have a significant leak.  At the last visit the patient was given a prescription of Sinemet however the patient reports that he never received this medication.  He continues to have a tremor in the hands. Reports that it primarily occurs when he is at rest.  Denies any trouble chewing or swallowing.  Denies any trouble sleeping.  Denies any significant changes with his gait or balance.  He reports that he continues to have trouble ambulating.  He does use a walker but fortunately has not had any falls.  He lives at home with his wife.  Complete all ADLs independently.  He does not operate a motor vehicle.  He returns today for evaluation.  REVIEW OF SYSTEMS: Out  of a complete 14 system review of symptoms, the patient complains only of the following symptoms, and all other reviewed systems are negative.   ALLERGIES: No Known Allergies  HOME MEDICATIONS: Outpatient Medications Prior to Visit  Medication Sig Dispense Refill  . ACCU-CHEK COMPACT PLUS test strip daily.    Marland Kitchen ACCU-CHEK SOFTCLIX LANCETS lancets daily.    Marland Kitchen acetaminophen (TYLENOL) 500 MG tablet Take 1,000 mg by mouth every 6 (six) hours as needed for mild pain.    Marland Kitchen JARDIANCE 25 MG TABS tablet Take 25 mg by mouth daily.    . JENTADUETO 2.5-500 MG TABS Take 1 tablet by mouth 2 (two) times daily.     Marland Kitchen NOVOTWIST 32G X 5 MM MISC USE ONE PEN NEEDLE WITH TRESIBA INJECTION DAILY  6  . rOPINIRole (REQUIP) 0.5 MG  tablet TAKE 1 TABLET BEFORE FOOD AT SUPPER TIME AND TAKE 1 TABLET AT BEDTIME. TAKE WITH WATER. 180 tablet 3  . TRESIBA FLEXTOUCH 200 UNIT/ML SOPN INJECT 12 UNITS ONCE DAILY  5  . atorvastatin (LIPITOR) 40 MG tablet Take 40 mg by mouth daily.    . carbidopa-levodopa (SINEMET) 25-100 MG tablet Take 1 tablet by mouth 3 (three) times daily. (Patient not taking: Reported on 06/20/2018) 90 tablet 5  . LORazepam (ATIVAN) 0.5 MG tablet 1 tablet at bedtime.    Marland Kitchen PARoxetine (PAXIL) 20 MG tablet Take 1 tablet (20 mg total) by mouth Nightly. 90 tablet 2  . temazepam (RESTORIL) 15 MG capsule Take 1 capsule (15 mg total) at bedtime by mouth. (Patient not taking: Reported on 06/20/2018) 90 capsule 1  . aspirin 325 MG tablet Take 650 mg by mouth once.     No facility-administered medications prior to visit.     PAST MEDICAL HISTORY: Past Medical History:  Diagnosis Date  . Depression   . Diabetes mellitus ([place])   . Hyperlipidemia   . Memory loss   . Parkinson's disease ([place])     PAST SURGICAL HISTORY: Past Surgical History:  Procedure Laterality Date  . CATARACT EXTRACTION W/ INTRAOCULAR LENS  IMPLANT, BILATERAL  2013   bilateral  . TONSILLECTOMY  1944    FAMILY HISTORY: Family History  Problem Relation Age of Onset  . Dementia Mother   . Colon cancer Neg Hx   . Stomach cancer Neg Hx     SOCIAL HISTORY: Social History   Socioeconomic History  . Marital status: Married    Spouse name: Lenell Antu  . Number of children: 1  . Years of education: 66  . Highest education level: Not on file  Occupational History  . Not on file  Social Needs  . Financial resource strain: Not on file  . Food insecurity:    Worry: Not on file    Inability: Not on file  . Transportation needs:    Medical: Not on file    Non-medical: Not on file  Tobacco Use  . Smoking status: Former Smoker    Types: Cigarettes    Last attempt to quit: 03/14/1974    Years since quitting: 44.2  . Smokeless tobacco: Never  Used  Substance and Sexual Activity  . Alcohol use: No  . Drug use: No  . Sexual activity: Not on file  Lifestyle  . Physical activity:    Days per week: Not on file    Minutes per session: Not on file  . Stress: Not on file  Relationships  . Social connections:    Talks on phone: Not on file  Gets together: Not on file    Attends religious service: Not on file    Active member of club or organization: Not on file    Attends meetings of clubs or organizations: Not on file    Relationship status: Not on file  . Intimate partner violence:    Fear of current or ex partner: Not on file    Emotionally abused: Not on file    Physically abused: Not on file    Forced sexual activity: Not on file  Other Topics Concern  . Not on file  Social History Narrative   Patient is married Personal assistant).   Patient has one son.   Patient is retired.   Patient has a high school education.   Patient is left-handed.   Patient drinks about 20 cups of coffee daily.      PHYSICAL EXAM  Vitals:   06/20/18 1343  BP: (!) 100/56  Pulse: 74  Weight: 177 lb 3.2 oz (80.4 kg)  Height: 5\' 8"  (1.727 m)   Body mass index is 26.94 kg/m.  MMSE - Mini Mental State Exam 06/20/2018 08/02/2017 03/28/2016  Orientation to time 4 4 4   Orientation to Place 5 5 5   Registration 3 3 3   Attention/ Calculation 1 1 4   Recall 2 3 3   Language- name 2 objects 2 2 2   Language- repeat 1 1 0  Language- follow 3 step command 3 2 3   Language- read & follow direction 1 1 1   Write a sentence 0 1 1  Copy design 0 1 0  Total score 22 24 26     Generalized: Well developed, in no acute distress Neck circumference: 16 , Mallapati 2  Neurological examination  Mentation: Alert oriented to time, place, history taking. Follows all commands speech and language fluent Cranial nerve II-XII: Pupils were equal round reactive to light. Extraocular movements were full, visual field were full on confrontational test. Facial sensation  and strength were normal. Uvula tongue midline. Head turning and shoulder shrug  were normal and symmetric. Motor: Resting tremor to both hands. The motor testing reveals 5 over 5 strength of all 4 extremities. Good symmetric motor tone is noted throughout. Finger taps and toe taps moderately impaired bilaterally.  Sensory: Sensory testing is intact to soft touch on all 4 extremities. No evidence of extinction is noted.  Coordination: Cerebellar testing reveals good finger-nose-finger and heel-to-shin bilaterally.  Gait and station: He is using a walker and is unable to stand from a seated position without assistance. He has a stooped posture and his gait is unsteady. Tends to shuffle gait and has freezing episodes.   Reflexes: Deep tendon reflexes are symmetric and normal bilaterally.   DIAGNOSTIC DATA (LABS, IMAGING, TESTING) - I reviewed patient records, labs, notes, testing and imaging myself where available.  Lab Results  Component Value Date   WBC 6.9 06/10/2014   HGB 15.5 06/10/2014   HCT 44.6 06/10/2014   MCV 94.5 06/10/2014   PLT 175 06/10/2014      Component Value Date/Time   NA 142 09/28/2015 1627   K 4.5 09/28/2015 1627   CL 100 09/28/2015 1627   CO2 24 09/28/2015 1627   GLUCOSE 144 (H) 09/28/2015 1627   GLUCOSE 176 (H) 06/10/2014 2052   BUN 20 09/28/2015 1627   CREATININE 0.83 09/28/2015 1627   CALCIUM 9.4 09/28/2015 1627   PROT 6.8 09/28/2015 1627   ALBUMIN 4.4 09/28/2015 1627   AST 19 09/28/2015 1627   ALT 17 09/28/2015  [place] 09/28/2015 1627   BILITOT 0.3 09/28/2015 1627   GFRNONAA 84 09/28/2015 1627   GFRAA 97 09/28/2015 1627     ASSESSMENT AND PLAN 84 y.o. year old male  has a past medical history of Depression, Diabetes mellitus ([place]), Hyperlipidemia, Memory loss, and Parkinson's disease ([place]). here with:  1. Parkinson's disease 2. Memory disturbance  3. Obstructive Sleep Apnea on CPAP  The CPAP download shows good compliance and management  of his apnea. He is encouraged to continue using his machine every night. He never started the Sinemet. He was encouraged to restart the medicine today. His wife reports they still have the bottle at home and they will restart it. He will start with 1/2 tablet TID. He continues to take Requip. He is no longer taking Paxil for depression. He will follow-up with his primary care doctor for his urinary incontinence issues. His memory is slightly changed from last visit. MMSE was 22/30 today (24/30 last time). We discussed starting a memory medication today. He wants to hold off and think about it. He will let us know if he changes his mind. He will follow-up in 6 months or sooner if needed. They will call if new symptoms or concerns develop.Ward Givens, MSN, NP-C 06/21/2018, 8:14 AM [facility] [address], [place], [place] [address] [phone]

## 2018-06-21 ENCOUNTER — Encounter: Payer: Self-pay | Admitting: Adult Health

## 2018-06-21 DIAGNOSIS — E119 Type 2 diabetes mellitus without complications: Secondary | ICD-10-CM | POA: Diagnosis not present

## 2018-06-21 DIAGNOSIS — Z125 Encounter for screening for malignant neoplasm of prostate: Secondary | ICD-10-CM | POA: Diagnosis not present

## 2018-06-21 DIAGNOSIS — E7800 Pure hypercholesterolemia, unspecified: Secondary | ICD-10-CM | POA: Diagnosis not present

## 2018-06-21 DIAGNOSIS — Z23 Encounter for immunization: Secondary | ICD-10-CM | POA: Diagnosis not present

## 2018-06-21 DIAGNOSIS — R82998 Other abnormal findings in urine: Secondary | ICD-10-CM | POA: Diagnosis not present

## 2018-06-21 DIAGNOSIS — I10 Essential (primary) hypertension: Secondary | ICD-10-CM | POA: Diagnosis not present

## 2018-07-05 DIAGNOSIS — Z0000 Encounter for general adult medical examination without abnormal findings: Secondary | ICD-10-CM | POA: Diagnosis not present

## 2018-07-05 DIAGNOSIS — Z23 Encounter for immunization: Secondary | ICD-10-CM | POA: Diagnosis not present

## 2018-07-05 DIAGNOSIS — R413 Other amnesia: Secondary | ICD-10-CM | POA: Diagnosis not present

## 2018-07-05 DIAGNOSIS — N182 Chronic kidney disease, stage 2 (mild): Secondary | ICD-10-CM | POA: Diagnosis not present

## 2018-07-05 DIAGNOSIS — E1129 Type 2 diabetes mellitus with other diabetic kidney complication: Secondary | ICD-10-CM | POA: Diagnosis not present

## 2018-07-05 DIAGNOSIS — R809 Proteinuria, unspecified: Secondary | ICD-10-CM | POA: Diagnosis not present

## 2018-07-05 DIAGNOSIS — G4733 Obstructive sleep apnea (adult) (pediatric): Secondary | ICD-10-CM | POA: Diagnosis not present

## 2018-07-05 DIAGNOSIS — G20 Parkinson's disease: Secondary | ICD-10-CM | POA: Diagnosis not present

## 2018-07-05 DIAGNOSIS — Z794 Long term (current) use of insulin: Secondary | ICD-10-CM | POA: Diagnosis not present

## 2018-07-05 DIAGNOSIS — K4090 Unilateral inguinal hernia, without obstruction or gangrene, not specified as recurrent: Secondary | ICD-10-CM | POA: Diagnosis not present

## 2018-07-11 ENCOUNTER — Ambulatory Visit: Payer: Medicare HMO | Admitting: Podiatry

## 2018-07-22 ENCOUNTER — Encounter (HOSPITAL_COMMUNITY): Payer: Self-pay | Admitting: Emergency Medicine

## 2018-07-22 ENCOUNTER — Other Ambulatory Visit: Payer: Self-pay

## 2018-07-22 ENCOUNTER — Ambulatory Visit (HOSPITAL_COMMUNITY)
Admission: EM | Admit: 2018-07-22 | Discharge: 2018-07-22 | Disposition: A | Discharge status: Home / Self Care | Payer: Medicare HMO | Attending: Internal Medicine | Admitting: Internal Medicine

## 2018-07-22 DIAGNOSIS — L821 Other seborrheic keratosis: Secondary | ICD-10-CM

## 2018-07-22 NOTE — ED Triage Notes (Signed)
Area to back of head on right side, behind ear.  Dark area to scalp that patient and family just noticed after a hair cut.

## 2018-07-22 NOTE — Discharge Instructions (Signed)
This appears to be a seborrheic Keratosis which often is benign  Follow up with primary care/dermatology for biopsy if changing

## 2018-07-24 NOTE — ED Provider Notes (Signed)
[facility]    CSN: [id number] Arrival date & time: 07/22/18  1735     History   Chief Complaint Chief Complaint  Patient presents with  . Hair/Scalp Problem    HPI Matthew [name] is a 84 y.o. male history of Parkinson's, DM type II, depression, presenting today for evaluation of lesion on his scalp.  Patient states that he recently had a haircut within the past week and noticed a dark spot that he was unaware of.  He has not had any pain or symptoms associated with this spot.  His main concern is if this could be a skin cancer.  His father passed away from skin cancer and this causes him to be very emotional about losing him this way and makes him concerned about developing skins cancer.  Denies any fevers, night sweats, unintentional weight loss.  HPI  Past Medical History:  Diagnosis Date  . Depression   . Diabetes mellitus ([place])   . Hyperlipidemia   . Memory loss   . Parkinson's disease [facility])     Patient Active Problem List   Diagnosis Date Noted  . PLMD (periodic limb movement disorder) 04/02/2017  . OSA on CPAP 09/28/2015  . Cogwheel rigidity 09/28/2015  . Nocturia more than twice per night 09/28/2015  . Action tremor 09/28/2015  . Circadian rhythm sleep disorder, irregular sleep wake type 10/21/2013  . Insomnia due to medical condition 10/21/2013  . Parkinson's disease (tremor, stiffness, slow motion, unstable posture) ([place]) 10/21/2013  . 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 Pseudobulbar affect 10/21/2013  . Parkinson's disease ([place]) 09/18/2012  . Abnormality of gait 09/18/2012  . Memory loss 09/18/2012    Past Surgical History:  Procedure Laterality Date  . CATARACT EXTRACTION W/ INTRAOCULAR LENS  IMPLANT, BILATERAL  2013   bilateral  . TONSILLECTOMY  1944       Home Medications    Prior to Admission medications   Medication Sig Start Date End Date Taking? Authorizing Provider  ACCU-CHEK COMPACT PLUS test strip daily. 10/01/13   [provider]  ACCU-CHEK SOFTCLIX LANCETS lancets daily. 09/08/13   [provider]  acetaminophen (TYLENOL) 500 MG tablet Take 1,000 mg by mouth every 6 (six) hours as needed for mild pain.    [provider]  atorvastatin (LIPITOR) 40 MG tablet Take 40 mg by mouth daily.    [provider]  carbidopa-levodopa (SINEMET) 25-100 MG tablet Take 0.5 tablets by mouth 3 (three) times daily. Patient not taking: Reported on 06/20/2018 06/20/18   Ward Givens, NP  JARDIANCE 25 MG TABS tablet Take 25 mg by mouth daily. 03/21/16   [provider]  JENTADUETO 2.5-500 MG TABS Take 1 tablet by mouth 2 (two) times daily.  10/27/13   [provider]  LORazepam (ATIVAN) 0.5 MG tablet 1 tablet at bedtime. 07/31/13   [provider]  NOVOTWIST 32G X 5 MM MISC USE ONE PEN NEEDLE WITH TRESIBA INJECTION DAILY 09/10/17   [provider]  PARoxetine (PAXIL) 20 MG tablet Take 1 tablet (20 mg total) by mouth Nightly. Patient not taking: Reported on 06/20/2018 10/21/13   Dohmeier, Asencion Partridge, MD  rOPINIRole (REQUIP) 0.5 MG tablet TAKE 1 TABLET BEFORE FOOD AT SUPPER TIME AND TAKE 1 TABLET AT BEDTIME. TAKE WITH WATER. 12/27/17   Dohmeier, Asencion Partridge, MD  TRESIBA FLEXTOUCH 200 UNIT/ML SOPN INJECT 12 UNITS ONCE DAILY 10/30/17   [provider]    Family History Family History  Problem Relation Age of Onset  .  Dementia Mother   . Colon cancer Neg Hx   . Stomach cancer Neg Hx     Social History Social History   Tobacco Use  . Smoking status: Former Smoker    Types: Cigarettes    Last attempt to quit: 03/14/1974    Years since quitting: 44.3  . Smokeless tobacco: Never Used  Substance Use Topics  . Alcohol use: No  . Drug use: No     Allergies   Patient has no known allergies.   Review of Systems Review of Systems  Constitutional: Negative for fatigue and fever.  Eyes: Negative for redness, itching and visual disturbance.  Respiratory: Negative for  shortness of breath.   Cardiovascular: Negative for chest pain and leg swelling.  Gastrointestinal: Negative for nausea and vomiting.  Musculoskeletal: Negative for arthralgias and myalgias.  Skin: Positive for color change. Negative for rash and wound.  Neurological: Negative for dizziness, syncope, weakness, light-headedness and headaches.     Physical Exam Triage Vital Signs ED Triage Vitals  Enc Vitals Group     BP 07/22/18 1937 125/67     Pulse Rate 07/22/18 1937 88     Resp 07/22/18 1937 (!) 24     Temp 07/22/18 1937 98.7 F (37.1 C)     Temp Source 07/22/18 1937 Temporal     SpO2 07/22/18 1937 97 %     Weight --      Height --      Head Circumference --      Peak Flow --      Pain Score 07/22/18 1936 0     Pain Loc --      Pain Edu? --      Excl. in [place]? --    No data found.  Updated Vital Signs BP 125/67 (BP Location: Right Arm)   Pulse 88   Temp 98.7 F (37.1 C) (Temporal)   Resp (!) 24   SpO2 97%   Visual Acuity Right Eye Distance:   Left Eye Distance:   Bilateral Distance:    Right Eye Near:   Left Eye Near:    Bilateral Near:     Physical Exam Vitals signs and nursing note reviewed.  Constitutional:      Appearance: He is well-developed.     Comments: No acute distress  HENT:     Head: Normocephalic and atraumatic.     Nose: Nose normal.  Eyes:     Conjunctiva/sclera: Conjunctivae normal.  Neck:     Musculoskeletal: Neck supple.  Cardiovascular:     Rate and Rhythm: Normal rate.  Pulmonary:     Effort: Pulmonary effort is normal. No respiratory distress.  Abdominal:     General: There is no distension.  Musculoskeletal: Normal range of motion.  Skin:    General: Skin is warm and dry.     Comments: Right occipital region of scalp with slightly raised grayish circular lesion, top portions appear slightly wartlike  Neurological:     Mental Status: He is alert and oriented to person, place, and time.      UC Treatments / Results    Labs (all labs ordered are listed, but only abnormal results are displayed) Labs Reviewed - No data to display  EKG None  Radiology No results found.  Procedures Procedures (including critical care time)  Medications Ordered in UC Medications - No data to display  Initial Impression / Assessment and Plan / UC Course  I have reviewed the triage vital signs and  the nursing notes.  Pertinent labs & imaging results that were available during my care of the patient were reviewed by me and considered in my medical decision making (see chart for details).     Lesion on scalp appears to be a keratosis, likely seborrheic, similar to other lesion he has on his right frontal forehead area, advised that this likely is not skin cancer and does not appear to be melanoma, but the only definitive way to nose with the biopsy.  Advised to monitor lesion for changes, follow-up with PCP or dermatology if changing or for biopsy.Discussed strict return precautions. Patient verbalized understanding and is agreeable with plan.  Final Clinical Impressions(s) / UC Diagnoses   Final diagnoses:  Keratosis, seborrheic     Discharge Instructions     This appears to be a seborrheic Keratosis which often is benign  Follow up with primary care/dermatology for biopsy if changing   ED Prescriptions    None     Controlled Substance Prescriptions [place] Controlled Substance Registry consulted? Not Applicable   Janith L[place], [place] 07/24/18 0945

## 2018-08-20 ENCOUNTER — Ambulatory Visit: Payer: Medicare HMO | Admitting: Podiatry

## 2018-09-03 DIAGNOSIS — N182 Chronic kidney disease, stage 2 (mild): Secondary | ICD-10-CM | POA: Diagnosis not present

## 2018-09-03 DIAGNOSIS — I10 Essential (primary) hypertension: Secondary | ICD-10-CM | POA: Diagnosis not present

## 2018-09-03 DIAGNOSIS — Z794 Long term (current) use of insulin: Secondary | ICD-10-CM | POA: Diagnosis not present

## 2018-09-03 DIAGNOSIS — E1129 Type 2 diabetes mellitus with other diabetic kidney complication: Secondary | ICD-10-CM | POA: Diagnosis not present

## 2018-12-05 DIAGNOSIS — N182 Chronic kidney disease, stage 2 (mild): Secondary | ICD-10-CM | POA: Diagnosis not present

## 2018-12-05 DIAGNOSIS — G20 Parkinson's disease: Secondary | ICD-10-CM | POA: Diagnosis not present

## 2018-12-05 DIAGNOSIS — Z794 Long term (current) use of insulin: Secondary | ICD-10-CM | POA: Diagnosis not present

## 2018-12-05 DIAGNOSIS — I10 Essential (primary) hypertension: Secondary | ICD-10-CM | POA: Diagnosis not present

## 2018-12-05 DIAGNOSIS — E1129 Type 2 diabetes mellitus with other diabetic kidney complication: Secondary | ICD-10-CM | POA: Diagnosis not present

## 2018-12-31 ENCOUNTER — Other Ambulatory Visit: Payer: Self-pay | Admitting: Neurology

## 2018-12-31 DIAGNOSIS — R29898 Other symptoms and signs involving the musculoskeletal system: Secondary | ICD-10-CM

## 2018-12-31 DIAGNOSIS — G252 Other specified forms of tremor: Secondary | ICD-10-CM

## 2018-12-31 DIAGNOSIS — R351 Nocturia: Secondary | ICD-10-CM

## 2018-12-31 DIAGNOSIS — G4733 Obstructive sleep apnea (adult) (pediatric): Secondary | ICD-10-CM

## 2019-01-09 DIAGNOSIS — G20 Parkinson's disease: Secondary | ICD-10-CM | POA: Diagnosis not present

## 2019-01-09 DIAGNOSIS — R809 Proteinuria, unspecified: Secondary | ICD-10-CM | POA: Diagnosis not present

## 2019-01-09 DIAGNOSIS — G4733 Obstructive sleep apnea (adult) (pediatric): Secondary | ICD-10-CM | POA: Diagnosis not present

## 2019-01-09 DIAGNOSIS — I129 Hypertensive chronic kidney disease with stage 1 through stage 4 chronic kidney disease, or unspecified chronic kidney disease: Secondary | ICD-10-CM | POA: Diagnosis not present

## 2019-01-09 DIAGNOSIS — E7800 Pure hypercholesterolemia, unspecified: Secondary | ICD-10-CM | POA: Diagnosis not present

## 2019-01-09 DIAGNOSIS — N182 Chronic kidney disease, stage 2 (mild): Secondary | ICD-10-CM | POA: Diagnosis not present

## 2019-01-09 DIAGNOSIS — Z794 Long term (current) use of insulin: Secondary | ICD-10-CM | POA: Diagnosis not present

## 2019-01-09 DIAGNOSIS — E1129 Type 2 diabetes mellitus with other diabetic kidney complication: Secondary | ICD-10-CM | POA: Diagnosis not present

## 2019-01-09 DIAGNOSIS — R413 Other amnesia: Secondary | ICD-10-CM | POA: Diagnosis not present

## 2019-01-14 ENCOUNTER — Encounter: Payer: Self-pay | Admitting: Neurology

## 2019-01-16 ENCOUNTER — Encounter: Payer: Self-pay | Admitting: Adult Health

## 2019-01-16 ENCOUNTER — Other Ambulatory Visit: Payer: Self-pay

## 2019-01-16 ENCOUNTER — Ambulatory Visit (INDEPENDENT_AMBULATORY_CARE_PROVIDER_SITE_OTHER): Payer: Medicare HMO | Admitting: Adult Health

## 2019-01-16 VITALS — BP 118/74 | HR 84 | Temp 96.8°F | Ht 67.0 in | Wt 168.6 lb

## 2019-01-16 DIAGNOSIS — R413 Other amnesia: Secondary | ICD-10-CM

## 2019-01-16 DIAGNOSIS — G4733 Obstructive sleep apnea (adult) (pediatric): Secondary | ICD-10-CM | POA: Diagnosis not present

## 2019-01-16 DIAGNOSIS — Z9989 Dependence on other enabling machines and devices: Secondary | ICD-10-CM | POA: Diagnosis not present

## 2019-01-16 DIAGNOSIS — G20 Parkinson's disease: Secondary | ICD-10-CM

## 2019-01-16 NOTE — Progress Notes (Signed)
PATIENT: Matthew [name] DOB: Mar 06, 1935  REASON FOR VISIT: follow up HISTORY FROM: patient  HISTORY OF PRESENT ILLNESS: Today 01/16/19:  Matthew [name] is an 84 year old male with a history of Parkinson's disease, memory loss, and obstructive sleep apnea on CPAP.  He returns today for follow-up.  He was started on Sinemet however wife reports that he was unable to tolerate it so this was discontinued.  The patient lives at home with his wife.  He is able to complete all ADLs independently.  He does not operate a motor vehicle.  He feels that his memory has remained stable.  Reports that he has good days and bad days.   In regards to Parkinson's he feels that this has remained stable.  He uses a walker when ambulating.  Denies any falls.  Does report a tremor in the upper extremities.  He continues on Requip.   The patient CPAP download shows that he use the machine nightly for compliance of 100% and use machine greater than 4 hours each night.  On average he uses his machine 5 hours and 33 minutes.  His residual AHI is 5.4 on 6 cm of water with EPR of 2.  His leak in the 95th percentile is 5.3.  Overall he feels that he is doing well.  He denies any new symptoms today.  He returns today for evaluation.  HISTORY 06/20/18  Matthew [name] is a 84 year old male with past medical history of Depression, Memory Loss, Parkinson's disease, and Obstructive Sleep Apnea on CPAP. He returns today for follow-up accompanied by his wife.   He reports his eqiupment is working well with his CPAP. He reveals good compliance at 100% over the last 30 days (05/21/2018-01/29-2020). He had 93% of > 4 hours usage days. On average he uses his machine for 5 hours and 9 minutes. His API was optimal at 3.6 and his leak was 2.7. He does sleep a lot during the day when he is watching TV. Epworth Sleepiness Scale is 13.  He reports there isn't much to do except sleep since he can't get out and do a lot as result of the  Parkinson's disease. However, he reports he is sleeping well at night.  Since he was here last he has developed some urinary incontinence and is having to wear adult diapers. He feels the urge to use the bathroom but he can't make it there in time. Also, is having bowel urgency and isn't able to make it to the bathroom. Twice he has had a bowel movement in his incontinence underwear. He does report past history of prostate issues.   He isn't taking the sinemet. He hasn't tried it in over a year, because it gave him diarrhea. He only took 2 tablets at that time. He denies falls. He feels off balance and the need to lean forward. He is using a walker. He has a strong appetite. He does have a significant tremor to his left hand. He denies trouble chewing or swallowing. He sleeps well at night. He is able to complete his ADLs. He does not drive a car.   He is no longer taking Paxil and isn't sure when the last time he had it was. He just recently lost a good friend and he still feels sadness over the loss.   He reports noticing problems with his memory, mainly remembering names. His last MMSE was March 2019 and was 24 at that time. He presents today for follow-up with his  wife.    REVIEW OF SYSTEMS: Out of a complete 14 system review of symptoms, the patient complains only of the following symptoms, and all other reviewed systems are negative.  Memory loss  ALLERGIES: No Known Allergies  HOME MEDICATIONS: Outpatient Medications Prior to Visit  Medication Sig Dispense Refill  . ACCU-CHEK COMPACT PLUS test strip daily.    Marland Kitchen ACCU-CHEK SOFTCLIX LANCETS lancets daily.    Marland Kitchen atorvastatin (LIPITOR) 40 MG tablet Take 40 mg by mouth daily.    Marland Kitchen JARDIANCE 25 MG TABS tablet Take 25 mg by mouth daily.    . JENTADUETO 2.5-500 MG TABS Take 1 tablet by mouth 2 (two) times daily.     Marland Kitchen LORazepam (ATIVAN) 0.5 MG tablet 1 tablet at bedtime.    Marland Kitchen NOVOTWIST 32G X 5 MM MISC USE ONE PEN NEEDLE WITH TRESIBA  INJECTION DAILY  6  . rOPINIRole (REQUIP) 0.5 MG tablet TAKE 1 TABLET BEFORE FOOD AT SUPPER TIME AND TAKE 1 TABLET AT BEDTIME. TAKE WITH WATER. 180 tablet 3  . TRESIBA FLEXTOUCH 200 UNIT/ML SOPN INJECT 12 UNITS ONCE DAILY  5  . acetaminophen (TYLENOL) 500 MG tablet Take 1,000 mg by mouth every 6 (six) hours as needed for mild pain.    . carbidopa-levodopa (SINEMET) 25-100 MG tablet Take 0.5 tablets by mouth 3 (three) times daily. (Patient not taking: Reported on 06/20/2018) 45 tablet 5  . PARoxetine (PAXIL) 20 MG tablet Take 1 tablet (20 mg total) by mouth Nightly. (Patient not taking: Reported on 06/20/2018) 90 tablet 2   No facility-administered medications prior to visit.     PAST MEDICAL HISTORY: Past Medical History:  Diagnosis Date  . Depression   . Diabetes mellitus ([place])   . Hyperlipidemia   . Memory loss   . Parkinson's disease ([place])     PAST SURGICAL HISTORY: Past Surgical History:  Procedure Laterality Date  . CATARACT EXTRACTION W/ INTRAOCULAR LENS  IMPLANT, BILATERAL  2013   bilateral  . TONSILLECTOMY  1944    FAMILY HISTORY: Family History  Problem Relation Age of Onset  . Dementia Mother   . Colon cancer Neg Hx   . Stomach cancer Neg Hx     SOCIAL HISTORY: Social History   Socioeconomic History  . Marital status: Married    Spouse name: Lenell Antu  . Number of children: 1  . Years of education: 42  . Highest education level: Not on file  Occupational History  . Not on file  Social Needs  . Financial resource strain: Not on file  . Food insecurity    Worry: Not on file    Inability: Not on file  . Transportation needs    Medical: Not on file    Non-medical: Not on file  Tobacco Use  . Smoking status: Former Smoker    Types: Cigarettes    Quit date: 03/14/1974    Years since quitting: 44.8  . Smokeless tobacco: Never Used  Substance and Sexual Activity  . Alcohol use: No  . Drug use: No  . Sexual activity: Not on file  Lifestyle  . Physical  activity    Days per week: Not on file    Minutes per session: Not on file  . Stress: Not on file  Relationships  . Social Herbalist on phone: Not on file    Gets together: Not on file    Attends religious service: Not on file    Active member of club or organization:  Not on file    Attends meetings of clubs or organizations: Not on file    Relationship status: Not on file  . Intimate partner violence    Fear of current or ex partner: Not on file    Emotionally abused: Not on file    Physically abused: Not on file    Forced sexual activity: Not on file  Other Topics Concern  . Not on file  Social History Narrative   Patient is married Personal assistant).   Patient has one son.   Patient is retired.   Patient has a high school education.   Patient is left-handed.   Patient drinks about 20 cups of coffee daily.      PHYSICAL EXAM  Vitals:   01/16/19 1442  BP: 118/74  Pulse: 84  Temp: (!) 96.8 F (36 C)  Weight: 168 lb 9.6 oz (76.5 kg)  Height: 5\' 7"  (1.702 m)   Body mass index is 26.41 kg/m. MMSE - Mini Mental State Exam 01/16/2019 06/20/2018 08/02/2017  Orientation to time 4 4 4   Orientation to Place 5 5 5   Registration 3 3 3   Attention/ Calculation 3 1 1   Recall 3 2 3   Language- name 2 objects 2 2 2   Language- repeat 1 1 1   Language- follow 3 step command 2 3 2   Language- read & follow direction 1 1 1   Write a sentence 0 0 1  Copy design 1 0 1  Copy design-comments named 9 animals - -  Total score 25 22 24     Generalized: Well developed, in no acute distress   Neurological examination  Mentation: Alert oriented to time, place, history taking. Follows all commands speech and language fluent Cranial nerve II-XII: Pupils were equal round reactive to light. Extraocular movements were full, visual field were full on confrontational test. Head turning and shoulder shrug  were normal and symmetric. Motor: The motor testing reveals 5 over 5 strength of all 4  extremities. Good symmetric motor tone is noted throughout.  Mild tremor noted in the upper extremities.  Moderate impairment of finger taps and toe taps.   Sensory: Sensory testing is intact to soft touch on all 4 extremities. No evidence of extinction is noted.  Coordination: Cerebellar testing reveals good finger-nose-finger and heel-to-shin bilaterally.  Gait and station: Patient has a shuffling gait.  He uses a walker when ambulating.  Tandem gait not attempted.   DIAGNOSTIC DATA (LABS, IMAGING, TESTING) - I reviewed patient records, labs, notes, testing and imaging myself where available.  Lab Results  Component Value Date   WBC 6.9 06/10/2014   HGB 15.5 06/10/2014   HCT 44.6 06/10/2014   MCV 94.5 06/10/2014   PLT 175 06/10/2014      Component Value Date/Time   NA 142 09/28/2015 1627   K 4.5 09/28/2015 1627   CL 100 09/28/2015 1627   CO2 24 09/28/2015 1627   GLUCOSE 144 (H) 09/28/2015 1627   GLUCOSE 176 (H) 06/10/2014 2052   BUN 20 09/28/2015 1627   CREATININE 0.83 09/28/2015 1627   CALCIUM 9.4 09/28/2015 1627   PROT 6.8 09/28/2015 1627   ALBUMIN 4.4 09/28/2015 1627   AST 19 09/28/2015 1627   ALT 17 09/28/2015 1627   ALKPHOS 73 09/28/2015 1627   BILITOT 0.3 09/28/2015 1627   GFRNONAA 84 09/28/2015 1627   GFRAA 97 09/28/2015 1627      ASSESSMENT AND PLAN 84 y.o. year old male  has a past medical history of Depression, Diabetes  mellitus ([place]), Hyperlipidemia, Memory loss, and Parkinson's disease ([place]). here with :  1.  Parkinson's disease 2.  Memory loss 3.  Obstructive sleep apnea on CPAP  Overall the patient has remained stable.  He will continue on Requip for Parkinson's.  Memory score has remained stable.  We will continue to monitor.  Currently he is not on any memory medication.  He is encouraged to continue using the CPAP nightly.  I will increase his pressure to 7 cm of water to see if this further reduces his AHI.  He is advised that if his symptoms worsen  or he develops new symptoms he should let us know.  He will follow-up in 6 months or sooner if needed.   Ward Givens, MSN, NP-C 01/16/2019, 2:50 PM [facility] [address], [place], [place] [address] [phone]

## 2019-01-16 NOTE — Patient Instructions (Signed)
Your Plan:  Continue requip Memory score stable Continue CPAP- I will increase pressure  Thank you for coming to see [place] at [facility]. I hope we have been able to provide you high quality care today.  You may receive a patient satisfaction survey over the next few weeks. We would appreciate your feedback and comments so that we may continue to improve ourselves and the health of our patients.

## 2019-01-20 NOTE — Progress Notes (Signed)
Fax confirmation for cpap orders received.  Huey Romans [id number].

## 2019-06-13 ENCOUNTER — Ambulatory Visit: Payer: Medicare HMO | Attending: Internal Medicine

## 2019-06-13 DIAGNOSIS — Z23 Encounter for immunization: Secondary | ICD-10-CM | POA: Insufficient documentation

## 2019-06-13 NOTE — Progress Notes (Signed)
   Covid-19 Vaccination Clinic  Name:  JAMUS RECLA    MRN: [id number] DOB: [birth year]  06/13/2019  Mr. Badders was observed post Covid-19 immunization for 15 minutes without incidence. He was provided with Vaccine Information Sheet and instruction to access the V-Safe system.   Mr. Hirschi was instructed to call 911 with any severe reactions post vaccine: Marland Kitchen Difficulty breathing  . Swelling of your face and throat  . A fast heartbeat  . A bad rash all over your body  . Dizziness and weakness    Immunizations Administered    Name Date Dose VIS Date Route   Pfizer COVID-19 Vaccine 06/13/2019  2:49 PM 0.3 mL 05/02/2019 Intramuscular   Manufacturer: [place]   Lot: [id number]   [place]: [id number]

## 2019-07-04 ENCOUNTER — Ambulatory Visit: Payer: Medicare HMO | Attending: Internal Medicine

## 2019-07-04 DIAGNOSIS — Z23 Encounter for immunization: Secondary | ICD-10-CM

## 2019-07-04 NOTE — Progress Notes (Signed)
   Covid-19 Vaccination Clinic  Name:  Matthew [name]    MRN: [id number] DOB: 08/18/1934  07/04/2019  [name] was observed post Covid-19 immunization for 15 minutes without incidence. He was provided with Vaccine Information Sheet and instruction to access the V-Safe system.   [name] was instructed to call 911 with any severe reactions post vaccine: Marland Kitchen Difficulty breathing  . Swelling of your face and throat  . A fast heartbeat  . A bad rash all over your body  . Dizziness and weakness    Immunizations Administered    Name Date Dose VIS Date Route   Pfizer COVID-19 Vaccine 07/04/2019  2:38 PM 0.3 mL 05/02/2019 Intramuscular   Manufacturer: [place]   Lot: [id number]   [place]: [id number]

## 2019-07-07 ENCOUNTER — Other Ambulatory Visit: Payer: Self-pay

## 2019-07-07 ENCOUNTER — Ambulatory Visit: Payer: Medicare HMO | Admitting: Podiatry

## 2019-07-07 ENCOUNTER — Encounter: Payer: Self-pay | Admitting: Podiatry

## 2019-07-07 VITALS — BP 114/76 | HR 79

## 2019-07-07 DIAGNOSIS — E1142 Type 2 diabetes mellitus with diabetic polyneuropathy: Secondary | ICD-10-CM | POA: Diagnosis not present

## 2019-07-07 DIAGNOSIS — M79676 Pain in unspecified toe(s): Secondary | ICD-10-CM

## 2019-07-07 DIAGNOSIS — L84 Corns and callosities: Secondary | ICD-10-CM

## 2019-07-07 DIAGNOSIS — B351 Tinea unguium: Secondary | ICD-10-CM | POA: Diagnosis not present

## 2019-07-07 NOTE — Patient Instructions (Signed)
Diabetes Mellitus and Foot Care Foot care is an important part of your health, especially when you have diabetes. Diabetes may cause you to have problems because of poor blood flow (circulation) to your feet and legs, which can cause your skin to:  Become thinner and drier.  Break more easily.  Heal more slowly.  Peel and crack. You may also have nerve damage (neuropathy) in your legs and feet, causing decreased feeling in them. This means that you may not notice minor injuries to your feet that could lead to more serious problems. Noticing and addressing any potential problems early is the best way to prevent future foot problems. How to care for your feet Foot hygiene  Wash your feet daily with warm water and mild soap. Do not use hot water. Then, pat your feet and the areas between your toes until they are completely dry. Do not soak your feet as this can dry your skin.  Trim your toenails straight across. Do not dig under them or around the cuticle. File the edges of your nails with an emery board or nail file.  Apply a moisturizing lotion or petroleum jelly to the skin on your feet and to dry, brittle toenails. Use lotion that does not contain alcohol and is unscented. Do not apply lotion between your toes. Shoes and socks  Wear clean socks or stockings every day. Make sure they are not too tight. Do not wear knee-high stockings since they may decrease blood flow to your legs.  Wear shoes that fit properly and have enough cushioning. Always look in your shoes before you put them on to be sure there are no objects inside.  To break in new shoes, wear them for just a few hours a day. This prevents injuries on your feet. Wounds, scrapes, corns, and calluses  Check your feet daily for blisters, cuts, bruises, sores, and redness. If you cannot see the bottom of your feet, use a mirror or ask someone for help.  Do not cut corns or calluses or try to remove them with medicine.  If you  find a minor scrape, cut, or break in the skin on your feet, keep it and the skin around it clean and dry. You may clean these areas with mild soap and water. Do not clean the area with peroxide, alcohol, or iodine.  If you have a wound, scrape, corn, or callus on your foot, look at it several times a day to make sure it is healing and not infected. Check for: ? Redness, swelling, or pain. ? Fluid or blood. ? Warmth. ? Pus or a bad smell. General instructions  Do not cross your legs. This may decrease blood flow to your feet.  Do not use heating pads or hot water bottles on your feet. They may burn your skin. If you have lost feeling in your feet or legs, you may not know this is happening until it is too late.  Protect your feet from hot and cold by wearing shoes, such as at the beach or on hot pavement.  Schedule a complete foot exam at least once a year (annually) or more often if you have foot problems. If you have foot problems, report any cuts, sores, or bruises to your health care provider immediately. Contact a health care provider if:  You have a medical condition that increases your risk of infection and you have any cuts, sores, or bruises on your feet.  You have an injury that is not   healing.  You have redness on your legs or feet.  You feel burning or tingling in your legs or feet.  You have pain or cramps in your legs and feet.  Your legs or feet are numb.  Your feet always feel cold.  You have pain around a toenail. Get help right away if:  You have a wound, scrape, corn, or callus on your foot and: ? You have pain, swelling, or redness that gets worse. ? You have fluid or blood coming from the wound, scrape, corn, or callus. ? Your wound, scrape, corn, or callus feels warm to the touch. ? You have pus or a bad smell coming from the wound, scrape, corn, or callus. ? You have a fever. ? You have a red line going up your leg. Summary  Check your feet every day  for cuts, sores, red spots, swelling, and blisters.  Moisturize feet and legs daily.  Wear shoes that fit properly and have enough cushioning.  If you have foot problems, report any cuts, sores, or bruises to your health care provider immediately.  Schedule a complete foot exam at least once a year (annually) or more often if you have foot problems. This information is not intended to replace advice given to you by your health care provider. Make sure you discuss any questions you have with your health care provider. Document Revised: 01/29/2019 Document Reviewed: 06/09/2016 Elsevier Patient Education  2020 Elsevier Inc.  

## 2019-07-08 NOTE — Progress Notes (Signed)
Subjective: Matthew [name] presents today for follow up of preventative diabetic foot care and painful mycotic nails b/l that are difficult to trim. Pain interferes with ambulation. Aggravating factors include wearing enclosed shoe gear. Pain is relieved with periodic professional debridement.   He states his toenails are painful today.   No Known Allergies   Objective: Vitals:   07/07/19 1605  BP: 114/76  Pulse: 79    Vascular Examination:  Capillary refill time to digits immediate b/l, palpable DP pulses b/l, faintly palpable PT pulses b/l, pedal hair absent b/l and skin temperature gradient within normal limits b/l  Dermatological Examination: Pedal skin with normal turgor, texture and tone bilaterally, no open wounds bilaterally, no interdigital macerations bilaterally, toenails 1-5 b/l elongated, dystrophic, thickened, crumbly with subungual debris and hyperkeratotic lesion(s) left 1st metatarsal head.  No erythema, no edema, no drainage, no flocculence  Musculoskeletal: Normal muscle strength 5/5 to all lower extremity muscle groups bilaterally, no pain crepitus or joint limitation noted with ROM b/l and bunion deformity noted b/l  Neurological: Protective sensation absent with 10g monofilament b/l and vibratory sensation absent b/l  Assessment: 1. Pain due to onychomycosis of toenail   2. Callus   3. Diabetic peripheral neuropathy associated with type 2 diabetes mellitus ([place])    Plan: -Continue diabetic foot care principles. Literature dispensed on today.  -Toenails 1-5 b/l were debrided in length and girth without iatrogenic bleeding. -calluses were debrided without complication or incident. Total number debrided =1 left 1st metatarsal head -Patient to continue soft, supportive shoe gear daily. -Patient to report any pedal injuries to medical professional immediately. -Patient/POA to call should there be question/concern in the interim.  Return in about 3 months  (around 10/04/2019) for diabetic nail trim.

## 2019-07-29 ENCOUNTER — Ambulatory Visit: Payer: Medicare HMO | Admitting: Adult Health

## 2019-08-01 DIAGNOSIS — Z0000 Encounter for general adult medical examination without abnormal findings: Secondary | ICD-10-CM | POA: Diagnosis not present

## 2019-08-01 DIAGNOSIS — E7800 Pure hypercholesterolemia, unspecified: Secondary | ICD-10-CM | POA: Diagnosis not present

## 2019-08-01 DIAGNOSIS — E1129 Type 2 diabetes mellitus with other diabetic kidney complication: Secondary | ICD-10-CM | POA: Diagnosis not present

## 2019-08-01 DIAGNOSIS — Z125 Encounter for screening for malignant neoplasm of prostate: Secondary | ICD-10-CM | POA: Diagnosis not present

## 2019-08-04 DIAGNOSIS — Z0000 Encounter for general adult medical examination without abnormal findings: Secondary | ICD-10-CM | POA: Diagnosis not present

## 2019-08-04 DIAGNOSIS — E1165 Type 2 diabetes mellitus with hyperglycemia: Secondary | ICD-10-CM | POA: Diagnosis not present

## 2019-08-04 DIAGNOSIS — Z9181 History of falling: Secondary | ICD-10-CM | POA: Diagnosis not present

## 2019-08-04 DIAGNOSIS — G4733 Obstructive sleep apnea (adult) (pediatric): Secondary | ICD-10-CM | POA: Diagnosis not present

## 2019-08-04 DIAGNOSIS — G20 Parkinson's disease: Secondary | ICD-10-CM | POA: Diagnosis not present

## 2019-08-04 DIAGNOSIS — R82998 Other abnormal findings in urine: Secondary | ICD-10-CM | POA: Diagnosis not present

## 2019-08-04 DIAGNOSIS — E7800 Pure hypercholesterolemia, unspecified: Secondary | ICD-10-CM | POA: Diagnosis not present

## 2019-08-04 DIAGNOSIS — Z794 Long term (current) use of insulin: Secondary | ICD-10-CM | POA: Diagnosis not present

## 2019-08-04 DIAGNOSIS — E1129 Type 2 diabetes mellitus with other diabetic kidney complication: Secondary | ICD-10-CM | POA: Diagnosis not present

## 2019-08-04 DIAGNOSIS — F411 Generalized anxiety disorder: Secondary | ICD-10-CM | POA: Diagnosis not present

## 2019-10-02 ENCOUNTER — Ambulatory Visit: Payer: Medicare HMO | Admitting: Adult Health

## 2019-10-06 ENCOUNTER — Ambulatory Visit: Payer: Medicare HMO | Admitting: Podiatry

## 2020-02-04 DIAGNOSIS — E119 Type 2 diabetes mellitus without complications: Secondary | ICD-10-CM | POA: Diagnosis not present

## 2020-02-04 DIAGNOSIS — Z794 Long term (current) use of insulin: Secondary | ICD-10-CM | POA: Diagnosis not present

## 2020-02-04 DIAGNOSIS — Z961 Presence of intraocular lens: Secondary | ICD-10-CM | POA: Diagnosis not present

## 2020-02-04 DIAGNOSIS — H04123 Dry eye syndrome of bilateral lacrimal glands: Secondary | ICD-10-CM | POA: Diagnosis not present

## 2020-05-03 DIAGNOSIS — R809 Proteinuria, unspecified: Secondary | ICD-10-CM | POA: Diagnosis not present

## 2020-05-03 DIAGNOSIS — I129 Hypertensive chronic kidney disease with stage 1 through stage 4 chronic kidney disease, or unspecified chronic kidney disease: Secondary | ICD-10-CM | POA: Diagnosis not present

## 2020-05-03 DIAGNOSIS — E7800 Pure hypercholesterolemia, unspecified: Secondary | ICD-10-CM | POA: Diagnosis not present

## 2020-05-03 DIAGNOSIS — E1129 Type 2 diabetes mellitus with other diabetic kidney complication: Secondary | ICD-10-CM | POA: Diagnosis not present

## 2020-05-03 DIAGNOSIS — N182 Chronic kidney disease, stage 2 (mild): Secondary | ICD-10-CM | POA: Diagnosis not present

## 2020-05-03 DIAGNOSIS — Z794 Long term (current) use of insulin: Secondary | ICD-10-CM | POA: Diagnosis not present

## 2020-05-03 DIAGNOSIS — E1165 Type 2 diabetes mellitus with hyperglycemia: Secondary | ICD-10-CM | POA: Diagnosis not present

## 2020-05-03 DIAGNOSIS — G20 Parkinson's disease: Secondary | ICD-10-CM | POA: Diagnosis not present

## 2020-05-03 DIAGNOSIS — Z9181 History of falling: Secondary | ICD-10-CM | POA: Diagnosis not present

## 2020-06-30 DIAGNOSIS — Z794 Long term (current) use of insulin: Secondary | ICD-10-CM | POA: Diagnosis not present

## 2020-06-30 DIAGNOSIS — N182 Chronic kidney disease, stage 2 (mild): Secondary | ICD-10-CM | POA: Diagnosis not present

## 2020-06-30 DIAGNOSIS — I129 Hypertensive chronic kidney disease with stage 1 through stage 4 chronic kidney disease, or unspecified chronic kidney disease: Secondary | ICD-10-CM | POA: Diagnosis not present

## 2020-06-30 DIAGNOSIS — G20 Parkinson's disease: Secondary | ICD-10-CM | POA: Diagnosis not present

## 2020-06-30 DIAGNOSIS — E1129 Type 2 diabetes mellitus with other diabetic kidney complication: Secondary | ICD-10-CM | POA: Diagnosis not present

## 2020-08-02 DIAGNOSIS — Z125 Encounter for screening for malignant neoplasm of prostate: Secondary | ICD-10-CM | POA: Diagnosis not present

## 2020-08-02 DIAGNOSIS — E1165 Type 2 diabetes mellitus with hyperglycemia: Secondary | ICD-10-CM | POA: Diagnosis not present

## 2020-08-02 DIAGNOSIS — E7800 Pure hypercholesterolemia, unspecified: Secondary | ICD-10-CM | POA: Diagnosis not present

## 2020-08-09 DIAGNOSIS — E1165 Type 2 diabetes mellitus with hyperglycemia: Secondary | ICD-10-CM | POA: Diagnosis not present

## 2020-08-09 DIAGNOSIS — N182 Chronic kidney disease, stage 2 (mild): Secondary | ICD-10-CM | POA: Diagnosis not present

## 2020-08-09 DIAGNOSIS — Z1331 Encounter for screening for depression: Secondary | ICD-10-CM | POA: Diagnosis not present

## 2020-08-09 DIAGNOSIS — Z1389 Encounter for screening for other disorder: Secondary | ICD-10-CM | POA: Diagnosis not present

## 2020-08-09 DIAGNOSIS — F411 Generalized anxiety disorder: Secondary | ICD-10-CM | POA: Diagnosis not present

## 2020-08-09 DIAGNOSIS — Z0000 Encounter for general adult medical examination without abnormal findings: Secondary | ICD-10-CM | POA: Diagnosis not present

## 2020-08-09 DIAGNOSIS — I129 Hypertensive chronic kidney disease with stage 1 through stage 4 chronic kidney disease, or unspecified chronic kidney disease: Secondary | ICD-10-CM | POA: Diagnosis not present

## 2020-08-09 DIAGNOSIS — R82998 Other abnormal findings in urine: Secondary | ICD-10-CM | POA: Diagnosis not present

## 2020-08-09 DIAGNOSIS — Z794 Long term (current) use of insulin: Secondary | ICD-10-CM | POA: Diagnosis not present

## 2020-08-09 DIAGNOSIS — G20 Parkinson's disease: Secondary | ICD-10-CM | POA: Diagnosis not present

## 2020-08-09 DIAGNOSIS — E1129 Type 2 diabetes mellitus with other diabetic kidney complication: Secondary | ICD-10-CM | POA: Diagnosis not present

## 2020-08-09 DIAGNOSIS — E7800 Pure hypercholesterolemia, unspecified: Secondary | ICD-10-CM | POA: Diagnosis not present

## 2020-10-27 ENCOUNTER — Ambulatory Visit: Payer: Medicare HMO | Admitting: Neurology

## 2020-12-29 DIAGNOSIS — Z0100 Encounter for examination of eyes and vision without abnormal findings: Secondary | ICD-10-CM | POA: Diagnosis not present

## 2020-12-29 DIAGNOSIS — H524 Presbyopia: Secondary | ICD-10-CM | POA: Diagnosis not present

## 2021-02-09 DIAGNOSIS — E1165 Type 2 diabetes mellitus with hyperglycemia: Secondary | ICD-10-CM | POA: Diagnosis not present

## 2021-02-09 DIAGNOSIS — N182 Chronic kidney disease, stage 2 (mild): Secondary | ICD-10-CM | POA: Diagnosis not present

## 2021-02-09 DIAGNOSIS — E1129 Type 2 diabetes mellitus with other diabetic kidney complication: Secondary | ICD-10-CM | POA: Diagnosis not present

## 2021-02-09 DIAGNOSIS — G20 Parkinson's disease: Secondary | ICD-10-CM | POA: Diagnosis not present

## 2021-02-09 DIAGNOSIS — I129 Hypertensive chronic kidney disease with stage 1 through stage 4 chronic kidney disease, or unspecified chronic kidney disease: Secondary | ICD-10-CM | POA: Diagnosis not present

## 2021-02-09 DIAGNOSIS — Z23 Encounter for immunization: Secondary | ICD-10-CM | POA: Diagnosis not present

## 2021-02-09 DIAGNOSIS — R809 Proteinuria, unspecified: Secondary | ICD-10-CM | POA: Diagnosis not present

## 2021-02-09 DIAGNOSIS — E7800 Pure hypercholesterolemia, unspecified: Secondary | ICD-10-CM | POA: Diagnosis not present

## 2021-02-09 DIAGNOSIS — Z794 Long term (current) use of insulin: Secondary | ICD-10-CM | POA: Diagnosis not present

## 2021-05-25 ENCOUNTER — Ambulatory Visit: Payer: Medicare HMO | Admitting: Adult Health

## 2021-08-10 DIAGNOSIS — R7989 Other specified abnormal findings of blood chemistry: Secondary | ICD-10-CM | POA: Diagnosis not present

## 2021-08-10 DIAGNOSIS — Z125 Encounter for screening for malignant neoplasm of prostate: Secondary | ICD-10-CM | POA: Diagnosis not present

## 2021-08-10 DIAGNOSIS — E1165 Type 2 diabetes mellitus with hyperglycemia: Secondary | ICD-10-CM | POA: Diagnosis not present

## 2021-08-10 DIAGNOSIS — E7800 Pure hypercholesterolemia, unspecified: Secondary | ICD-10-CM | POA: Diagnosis not present

## 2021-08-17 DIAGNOSIS — Z1331 Encounter for screening for depression: Secondary | ICD-10-CM | POA: Diagnosis not present

## 2021-08-17 DIAGNOSIS — Z0000 Encounter for general adult medical examination without abnormal findings: Secondary | ICD-10-CM | POA: Diagnosis not present

## 2021-08-17 DIAGNOSIS — Z1389 Encounter for screening for other disorder: Secondary | ICD-10-CM | POA: Diagnosis not present

## 2021-08-17 DIAGNOSIS — I129 Hypertensive chronic kidney disease with stage 1 through stage 4 chronic kidney disease, or unspecified chronic kidney disease: Secondary | ICD-10-CM | POA: Diagnosis not present

## 2021-08-17 DIAGNOSIS — N182 Chronic kidney disease, stage 2 (mild): Secondary | ICD-10-CM | POA: Diagnosis not present

## 2021-08-17 DIAGNOSIS — G20 Parkinson's disease: Secondary | ICD-10-CM | POA: Diagnosis not present

## 2021-08-17 DIAGNOSIS — E1129 Type 2 diabetes mellitus with other diabetic kidney complication: Secondary | ICD-10-CM | POA: Diagnosis not present

## 2021-08-17 DIAGNOSIS — Z794 Long term (current) use of insulin: Secondary | ICD-10-CM | POA: Diagnosis not present

## 2021-08-17 DIAGNOSIS — Z9114 Patient's other noncompliance with medication regimen: Secondary | ICD-10-CM | POA: Diagnosis not present

## 2021-08-17 DIAGNOSIS — E1165 Type 2 diabetes mellitus with hyperglycemia: Secondary | ICD-10-CM | POA: Diagnosis not present

## 2021-08-17 DIAGNOSIS — Z9181 History of falling: Secondary | ICD-10-CM | POA: Diagnosis not present

## 2021-08-31 ENCOUNTER — Telehealth: Payer: Self-pay | Admitting: Adult Health

## 2021-08-31 NOTE — Telephone Encounter (Signed)
Pt called wanting to know if he can receive some help for him to learn how to get himself up from the floor by himself. Pt states he has had several falls recently. We did not know if this had to do with his Parkinson's or if he is needing a new referral. Please advise. ?

## 2021-08-31 NOTE — Telephone Encounter (Addendum)
I called pt , he relayed that he has had some falls, not sure if related to PD, he has not hurt himself.  I relayed that we have not seen him since 12/2018.  F/A Dr. Brett Fairy 11-16-2021 at 1030 arrive 1000, gave appt to wife hilda as well.   He had not seen pcp about this, I relayed to contact and make appt for evaluation.  May recommend PT.   Had also been seen for memory loss last MMSE 23. Placed on waitlist for CD/MD and MM/NP. ?

## 2021-09-07 DIAGNOSIS — M2011 Hallux valgus (acquired), right foot: Secondary | ICD-10-CM | POA: Diagnosis not present

## 2021-09-07 DIAGNOSIS — M25471 Effusion, right ankle: Secondary | ICD-10-CM | POA: Diagnosis not present

## 2021-09-07 DIAGNOSIS — M792 Neuralgia and neuritis, unspecified: Secondary | ICD-10-CM | POA: Diagnosis not present

## 2021-09-07 DIAGNOSIS — B351 Tinea unguium: Secondary | ICD-10-CM | POA: Diagnosis not present

## 2021-09-07 DIAGNOSIS — L851 Acquired keratosis [keratoderma] palmaris et plantaris: Secondary | ICD-10-CM | POA: Diagnosis not present

## 2021-09-07 DIAGNOSIS — M79671 Pain in right foot: Secondary | ICD-10-CM | POA: Diagnosis not present

## 2021-09-07 DIAGNOSIS — L603 Nail dystrophy: Secondary | ICD-10-CM | POA: Diagnosis not present

## 2021-09-07 DIAGNOSIS — E1142 Type 2 diabetes mellitus with diabetic polyneuropathy: Secondary | ICD-10-CM | POA: Diagnosis not present

## 2021-09-07 DIAGNOSIS — I739 Peripheral vascular disease, unspecified: Secondary | ICD-10-CM | POA: Diagnosis not present

## 2021-09-23 ENCOUNTER — Other Ambulatory Visit: Payer: Self-pay

## 2021-09-23 ENCOUNTER — Emergency Department (HOSPITAL_COMMUNITY): Payer: Medicare HMO

## 2021-09-23 ENCOUNTER — Inpatient Hospital Stay (HOSPITAL_COMMUNITY)
Admission: EM | Admit: 2021-09-23 | Discharge: 2021-09-30 | DRG: 389 | Disposition: A | Discharge status: Skilled Nursing Facility | Payer: Medicare HMO | Attending: Internal Medicine | Admitting: Internal Medicine

## 2021-09-23 ENCOUNTER — Encounter (HOSPITAL_COMMUNITY): Payer: Self-pay

## 2021-09-23 DIAGNOSIS — E8721 Acute metabolic acidosis: Secondary | ICD-10-CM | POA: Diagnosis present

## 2021-09-23 DIAGNOSIS — Z7984 Long term (current) use of oral hypoglycemic drugs: Secondary | ICD-10-CM

## 2021-09-23 DIAGNOSIS — E785 Hyperlipidemia, unspecified: Secondary | ICD-10-CM | POA: Diagnosis present

## 2021-09-23 DIAGNOSIS — F0280 Dementia in other diseases classified elsewhere without behavioral disturbance: Secondary | ICD-10-CM | POA: Diagnosis present

## 2021-09-23 DIAGNOSIS — R531 Weakness: Secondary | ICD-10-CM

## 2021-09-23 DIAGNOSIS — R112 Nausea with vomiting, unspecified: Secondary | ICD-10-CM | POA: Diagnosis not present

## 2021-09-23 DIAGNOSIS — R296 Repeated falls: Secondary | ICD-10-CM | POA: Diagnosis not present

## 2021-09-23 DIAGNOSIS — G20 Parkinson's disease: Secondary | ICD-10-CM | POA: Diagnosis not present

## 2021-09-23 DIAGNOSIS — Z87891 Personal history of nicotine dependence: Secondary | ICD-10-CM | POA: Diagnosis not present

## 2021-09-23 DIAGNOSIS — R2681 Unsteadiness on feet: Secondary | ICD-10-CM | POA: Diagnosis not present

## 2021-09-23 DIAGNOSIS — Z7401 Bed confinement status: Secondary | ICD-10-CM | POA: Diagnosis not present

## 2021-09-23 DIAGNOSIS — R000 Tachycardia, unspecified: Secondary | ICD-10-CM | POA: Diagnosis not present

## 2021-09-23 DIAGNOSIS — Z79899 Other long term (current) drug therapy: Secondary | ICD-10-CM

## 2021-09-23 DIAGNOSIS — R41841 Cognitive communication deficit: Secondary | ICD-10-CM | POA: Diagnosis not present

## 2021-09-23 DIAGNOSIS — L89151 Pressure ulcer of sacral region, stage 1: Secondary | ICD-10-CM | POA: Diagnosis present

## 2021-09-23 DIAGNOSIS — G20A1 Parkinson's disease without dyskinesia, without mention of fluctuations: Secondary | ICD-10-CM | POA: Diagnosis present

## 2021-09-23 DIAGNOSIS — Z20822 Contact with and (suspected) exposure to covid-19: Secondary | ICD-10-CM | POA: Diagnosis not present

## 2021-09-23 DIAGNOSIS — E1165 Type 2 diabetes mellitus with hyperglycemia: Secondary | ICD-10-CM | POA: Diagnosis not present

## 2021-09-23 DIAGNOSIS — F419 Anxiety disorder, unspecified: Secondary | ICD-10-CM | POA: Diagnosis present

## 2021-09-23 DIAGNOSIS — M6281 Muscle weakness (generalized): Secondary | ICD-10-CM | POA: Diagnosis not present

## 2021-09-23 DIAGNOSIS — K567 Ileus, unspecified: Principal | ICD-10-CM | POA: Diagnosis present

## 2021-09-23 DIAGNOSIS — R5081 Fever presenting with conditions classified elsewhere: Secondary | ICD-10-CM

## 2021-09-23 DIAGNOSIS — R1312 Dysphagia, oropharyngeal phase: Secondary | ICD-10-CM | POA: Diagnosis not present

## 2021-09-23 DIAGNOSIS — I959 Hypotension, unspecified: Secondary | ICD-10-CM | POA: Diagnosis not present

## 2021-09-23 DIAGNOSIS — R509 Fever, unspecified: Secondary | ICD-10-CM | POA: Diagnosis not present

## 2021-09-23 DIAGNOSIS — R739 Hyperglycemia, unspecified: Secondary | ICD-10-CM | POA: Diagnosis not present

## 2021-09-23 DIAGNOSIS — F32A Depression, unspecified: Secondary | ICD-10-CM | POA: Diagnosis present

## 2021-09-23 DIAGNOSIS — F0390 Unspecified dementia without behavioral disturbance: Secondary | ICD-10-CM | POA: Diagnosis not present

## 2021-09-23 DIAGNOSIS — R5383 Other fatigue: Secondary | ICD-10-CM | POA: Diagnosis not present

## 2021-09-23 DIAGNOSIS — L8990 Pressure ulcer of unspecified site, unspecified stage: Secondary | ICD-10-CM | POA: Diagnosis present

## 2021-09-23 DIAGNOSIS — R2689 Other abnormalities of gait and mobility: Secondary | ICD-10-CM | POA: Diagnosis not present

## 2021-09-23 LAB — RESP PANEL BY RT-PCR (FLU A&B, COVID) ARPGX2
Influenza A by PCR: NEGATIVE
Influenza B by PCR: NEGATIVE
SARS Coronavirus 2 by RT PCR: NEGATIVE

## 2021-09-23 LAB — LACTIC ACID, PLASMA
Lactic Acid, Venous: 3 mmol/L (ref 0.5–1.9)
Lactic Acid, Venous: 2 mmol/L (ref 0.5–1.9)

## 2021-09-23 LAB — COMPREHENSIVE METABOLIC PANEL
ALT: 11 U/L (ref 0–44)
AST: 23 U/L (ref 15–41)
Albumin: 3.2 g/dL — ABNORMAL LOW (ref 3.5–5.0)
Alkaline Phosphatase: 62 U/L (ref 38–126)
Anion gap: 10 (ref 5–15)
BUN: 25 mg/dL — ABNORMAL HIGH (ref 8–23)
CO2: 20 mmol/L — ABNORMAL LOW (ref 22–32)
Calcium: 7.7 mg/dL — ABNORMAL LOW (ref 8.9–10.3)
Chloride: 109 mmol/L (ref 98–111)
Creatinine, Ser: 0.96 mg/dL (ref 0.61–1.24)
GFR, Estimated: >60 mL/min (ref >60)
Glucose, Bld: 308 mg/dL — ABNORMAL HIGH (ref 70–99)
Potassium: 4.5 mmol/L (ref 3.5–5.1)
Sodium: 139 mmol/L (ref 135–145)
Total Bilirubin: 1.1 mg/dL (ref 0.3–1.2)
Total Protein: 6.3 g/dL — ABNORMAL LOW (ref 6.5–8.1)

## 2021-09-23 LAB — CBG MONITORING, ED
Glucose-Capillary: 349 mg/dL — ABNORMAL HIGH (ref 70–99)
Glucose-Capillary: 234 mg/dL — ABNORMAL HIGH (ref 70–99)

## 2021-09-23 LAB — CBC WITH DIFFERENTIAL/PLATELET
Abs Immature Granulocytes: 0.02 10*3/uL (ref 0.00–0.07)
Basophils Absolute: 0 10*3/uL (ref 0.0–0.1)
Basophils Relative: 0 %
Eosinophils Absolute: 0 10*3/uL (ref 0.0–0.5)
Eosinophils Relative: 0 %
HCT: 45.5 % (ref 39.0–52.0)
Hemoglobin: 15.7 g/dL (ref 13.0–17.0)
Immature Granulocytes: 0 %
Lymphocytes Relative: 9 %
Lymphs Abs: 0.5 10*3/uL — ABNORMAL LOW (ref 0.7–4.0)
MCH: 32.8 pg (ref 26.0–34.0)
MCHC: 34.5 g/dL (ref 30.0–36.0)
MCV: 95.2 fL (ref 80.0–100.0)
Monocytes Absolute: 0.4 10*3/uL (ref 0.1–1.0)
Monocytes Relative: 6 %
Neutro Abs: 4.9 10*3/uL (ref 1.7–7.7)
Neutrophils Relative %: 85 %
Platelets: 171 10*3/uL (ref 150–400)
RBC: 4.78 MIL/uL (ref 4.22–5.81)
RDW: 13.9 % (ref 11.5–15.5)
WBC: 5.9 10*3/uL (ref 4.0–10.5)
nRBC: 0 % (ref 0.0–0.2)

## 2021-09-23 LAB — URINALYSIS, ROUTINE W REFLEX MICROSCOPIC
Bacteria, UA: NONE SEEN
Bilirubin Urine: NEGATIVE
Glucose, UA: >=500 mg/dL — AB
Ketones, ur: NEGATIVE mg/dL
Leukocytes,Ua: NEGATIVE
Nitrite: NEGATIVE
Protein, ur: NEGATIVE mg/dL
Specific Gravity, Urine: 1.029 (ref 1.005–1.030)
pH: 5 (ref 5.0–8.0)

## 2021-09-23 LAB — PROTIME-INR
INR: 1.3 — ABNORMAL HIGH (ref 0.8–1.2)
Prothrombin Time: 16.3 seconds — ABNORMAL HIGH (ref 11.4–15.2)

## 2021-09-23 LAB — APTT: aPTT: 24 seconds (ref 24–36)

## 2021-09-23 MED ORDER — ONDANSETRON HCL 4 MG/2ML IJ SOLN: 4.0000 mg | Freq: Four times a day (QID) | INTRAMUSCULAR | Status: DC | PRN

## 2021-09-23 MED ORDER — SODIUM CHLORIDE 0.9 % IV SOLN
2.0000 g | Freq: Once | INTRAVENOUS | Status: AC
  Administered 2021-09-23: 2 g via INTRAVENOUS
  Filled 2021-09-23: qty 20

## 2021-09-23 MED ORDER — ACETAMINOPHEN 325 MG PO TABS
650.0000 mg | ORAL_TABLET | Freq: Once | ORAL | Status: AC
  Administered 2021-09-23: 650 mg via ORAL
  Filled 2021-09-23: qty 2

## 2021-09-23 MED ORDER — SODIUM CHLORIDE 0.9 % IV BOLUS
1000.0000 mL | Freq: Once | INTRAVENOUS | Status: AC
Start: 2021-09-23 — End: 2021-09-23
  Administered 2021-09-23: 1000 mL via INTRAVENOUS

## 2021-09-23 MED ORDER — ENOXAPARIN SODIUM 40 MG/0.4ML IJ SOSY
40.0000 mg | PREFILLED_SYRINGE | Freq: Every day | INTRAMUSCULAR | Status: DC
  Administered 2021-09-24 – 2021-09-29 (×7): 40 mg via SUBCUTANEOUS
  Filled 2021-09-23 (×7): qty 0.4

## 2021-09-23 MED ORDER — ACETAMINOPHEN 325 MG PO TABS
650.0000 mg | ORAL_TABLET | Freq: Four times a day (QID) | ORAL | Status: DC | PRN
  Administered 2021-09-24 – 2021-09-29 (×6): 650 mg via ORAL
  Filled 2021-09-23 (×6): qty 2

## 2021-09-23 MED ORDER — INSULIN ASPART 100 UNIT/ML IJ SOLN
0.0000 [IU] | Freq: Three times a day (TID) | INTRAMUSCULAR | Status: DC
  Administered 2021-09-24 (×2): 3 [IU] via SUBCUTANEOUS
  Administered 2021-09-25: 2 [IU] via SUBCUTANEOUS
  Administered 2021-09-25 – 2021-09-26 (×3): 3 [IU] via SUBCUTANEOUS
  Administered 2021-09-26: 2 [IU] via SUBCUTANEOUS
  Administered 2021-09-26 – 2021-09-27 (×4): 3 [IU] via SUBCUTANEOUS
  Administered 2021-09-28: 7 [IU] via SUBCUTANEOUS
  Administered 2021-09-28: 2 [IU] via SUBCUTANEOUS
  Administered 2021-09-28 – 2021-09-29 (×2): 3 [IU] via SUBCUTANEOUS
  Administered 2021-09-29: 2 [IU] via SUBCUTANEOUS
  Administered 2021-09-29: 9 [IU] via SUBCUTANEOUS
  Administered 2021-09-30: 3 [IU] via SUBCUTANEOUS
  Administered 2021-09-30: 1 [IU] via SUBCUTANEOUS
  Filled 2021-09-23: qty 0.09

## 2021-09-23 MED ORDER — SODIUM CHLORIDE 0.9 % IV BOLUS
1000.0000 mL | Freq: Once | INTRAVENOUS | Status: AC
  Administered 2021-09-23: 1000 mL via INTRAVENOUS

## 2021-09-23 MED ORDER — LACTATED RINGERS IV SOLN: INTRAVENOUS | Status: AC

## 2021-09-23 MED ORDER — INSULIN ASPART 100 UNIT/ML IJ SOLN
0.0000 [IU] | Freq: Every day | INTRAMUSCULAR | Status: DC
  Administered 2021-09-23: 2 [IU] via SUBCUTANEOUS
  Administered 2021-09-24: 3 [IU] via SUBCUTANEOUS
  Administered 2021-09-25 – 2021-09-29 (×4): 2 [IU] via SUBCUTANEOUS
  Filled 2021-09-23: qty 0.05

## 2021-09-23 MED ORDER — MELATONIN 3 MG PO TABS
3.0000 mg | ORAL_TABLET | Freq: Every evening | ORAL | Status: DC | PRN
  Administered 2021-09-27 – 2021-09-29 (×3): 3 mg via ORAL
  Filled 2021-09-23 (×3): qty 1

## 2021-09-23 MED ORDER — POLYETHYLENE GLYCOL 3350 17 G PO PACK: 17.0000 g | PACK | Freq: Every day | ORAL | Status: DC | PRN

## 2021-09-23 MED ORDER — ATORVASTATIN CALCIUM 40 MG PO TABS
40.0000 mg | ORAL_TABLET | Freq: Every day | ORAL | Status: DC
  Administered 2021-09-24 – 2021-09-30 (×7): 40 mg via ORAL
  Filled 2021-09-23 (×7): qty 1

## 2021-09-23 NOTE — ED Triage Notes (Signed)
[facility] reports pt coming from w/complaints of fatigue, lethargic and fever. Family states pt slipped out of recliner twice today. Initial BP 80/38 with EMS. CBG '432mg'$ /dl ?

## 2021-09-23 NOTE — ED Notes (Signed)
Abbe Amsterdam (pt son) contact information [phone] ?

## 2021-09-23 NOTE — ED Provider Notes (Addendum)
[facility]-EMERGENCY DEPT Provider Note   CSN: [id number] Arrival date & time: 09/23/21  1522     History  Chief Complaint  Patient presents with   Weakness    Matthew [name] is a 87 y.o. male.  Patient has a history of Parkinson disease and diabetes.  He has been lethargic today according to his wife  The history is provided by the patient and medical records. No language interpreter was used.  Weakness Severity:  Moderate Timing:  Constant Progression:  Waxing and waning Chronicity:  New Context: not alcohol use   Relieved by:  Nothing Worsened by:  Nothing Ineffective treatments:  None tried Associated symptoms: no abdominal pain, no chest pain, no cough, no diarrhea, no frequency, no headaches and no seizures   Risk factors: no anemia       Home Medications Prior to Admission medications   Medication Sig Start Date End Date Taking? Authorizing Provider  ACCU-CHEK COMPACT PLUS test strip daily. 10/01/13   [provider]  ACCU-CHEK SOFTCLIX LANCETS lancets daily. 09/08/13   [provider]  atorvastatin (LIPITOR) 40 MG tablet Take 40 mg by mouth daily.    [provider]  FLUAD QUADRIVALENT 0.5 ML injection  01/20/19   [provider]  JARDIANCE 25 MG TABS tablet Take 25 mg by mouth daily. 03/21/16   [provider]  JENTADUETO 2.5-500 MG TABS Take 1 tablet by mouth 2 (two) times daily.  10/27/13   [provider]  LORazepam (ATIVAN) 0.5 MG tablet 1 tablet at bedtime. 07/31/13   [provider]  NOVOTWIST 32G X 5 MM MISC USE ONE PEN NEEDLE WITH TRESIBA INJECTION DAILY 09/10/17   [provider]  PARoxetine (PAXIL) 20 MG tablet  04/10/19   [provider]  rOPINIRole (REQUIP) 0.5 MG tablet TAKE 1 TABLET BEFORE FOOD AT SUPPER TIME AND TAKE 1 TABLET AT BEDTIME. TAKE WITH WATER. 01/01/19   Dohmeier, Porfirio Mylar, MD  TRESIBA FLEXTOUCH 200 UNIT/ML SOPN INJECT 12 UNITS ONCE DAILY  10/30/17   [provider]      Allergies    Patient has no known allergies.    Review of Systems   Review of Systems  Constitutional:  Negative for appetite change and fatigue.  HENT:  Negative for congestion, ear discharge and sinus pressure.   Eyes:  Negative for discharge.  Respiratory:  Negative for cough.   Cardiovascular:  Negative for chest pain.  Gastrointestinal:  Negative for abdominal pain and diarrhea.  Genitourinary:  Negative for frequency and hematuria.  Musculoskeletal:  Negative for back pain.  Skin:  Negative for rash.  Neurological:  Positive for weakness. Negative for seizures and headaches.  Psychiatric/Behavioral:  Negative for hallucinations.    Physical Exam Updated Vital Signs BP 108/60   Pulse 81   Temp (!) 97.5 F (36.4 C) (Oral)   Resp 20   Ht 5\' 8"  (1.727 m)   SpO2 93%   BMI 25.64 kg/m  Physical Exam Vitals and nursing note reviewed.  Constitutional:      Appearance: He is well-developed.  HENT:     Head: Normocephalic.     Nose: Nose normal.  Eyes:     General: No scleral icterus.    Conjunctiva/sclera: Conjunctivae normal.  Neck:     Thyroid: No thyromegaly.  Cardiovascular:     Rate and Rhythm: Normal rate and regular rhythm.     Heart sounds: No murmur heard.   No friction rub. No gallop.  Pulmonary:     Breath sounds: No stridor. No wheezing or rales.  Chest:     Chest wall: No tenderness.  Abdominal:     General: There is no distension.     Tenderness: There is no abdominal tenderness. There is no rebound.  Musculoskeletal:        General: Normal range of motion.     Cervical back: Neck supple.  Lymphadenopathy:     Cervical: No cervical adenopathy.  Skin:    Findings: No erythema or rash.  Neurological:     Mental Status: He is oriented to person, place, and time.     Motor: No abnormal muscle tone.     Coordination: Coordination normal.  Psychiatric:        Behavior: Behavior normal.    ED Results /  Procedures / Treatments   Labs (all labs ordered are listed, but only abnormal results are displayed) Labs Reviewed  LACTIC ACID, PLASMA - Abnormal; Notable for the following components:      Result Value   Lactic Acid, Venous 3.0 (*)    All other components within normal limits  LACTIC ACID, PLASMA - Abnormal; Notable for the following components:   Lactic Acid, Venous 2.0 (*)    All other components within normal limits  CBC WITH DIFFERENTIAL/PLATELET - Abnormal; Notable for the following components:   Lymphs Abs 0.5 (*)    All other components within normal limits  PROTIME-INR - Abnormal; Notable for the following components:   Prothrombin Time 16.3 (*)    INR 1.3 (*)    All other components within normal limits  URINALYSIS, ROUTINE W REFLEX MICROSCOPIC - Abnormal; Notable for the following components:   Glucose, UA >=500 (*)    Hgb urine dipstick SMALL (*)    All other components within normal limits  COMPREHENSIVE METABOLIC PANEL - Abnormal; Notable for the following components:   CO2 20 (*)    Glucose, Bld 308 (*)    BUN 25 (*)    Calcium 7.7 (*)    Total Protein 6.3 (*)    Albumin 3.2 (*)    All other components within normal limits  CBG MONITORING, ED - Abnormal; Notable for the following components:   Glucose-Capillary 349 (*)    All other components within normal limits  RESP PANEL BY RT-PCR (FLU A&B, COVID) ARPGX2  CULTURE, BLOOD (ROUTINE X 2)  CULTURE, BLOOD (ROUTINE X 2)  URINE CULTURE  APTT  COMPREHENSIVE METABOLIC PANEL    EKG None  Radiology DG Chest Port 1 View  Result Date: 09/23/2021 CLINICAL DATA:  Fatigue, lethargy, and fever. Hypotension. Hyperglycemia. Falls today. EXAM: PORTABLE CHEST 1 VIEW COMPARISON:  06/10/2014 FINDINGS: Subsegmental atelectasis or scarring at the left lung base. The lungs appear otherwise clear. Heart size within normal limits. Mild thoracic spondylosis. IMPRESSION: 1. Linear subsegmental atelectasis or scarring at the left  lung base. 2. Mild thoracic spondylosis. Electronically Signed   By: Gaylyn Rong M.D.   On: 09/23/2021 16:35    Procedures Procedures    Medications Ordered in ED Medications  sodium chloride 0.9 % bolus 1,000 mL (0 mLs Intravenous Stopped 09/23/21 1713)  cefTRIAXone (ROCEPHIN) 2 g in sodium chloride 0.9 % 100 mL IVPB (0 g Intravenous Stopped 09/23/21 1751)  sodium chloride 0.9 % bolus 1,000 mL (0 mLs Intravenous Stopped 09/23/21 1927)  acetaminophen (TYLENOL) tablet 650 mg (650 mg Oral Given 09/23/21 1729)    ED Course/ Medical Decision Making/ A&P  CRITICAL CARE Performed by:  Bethann Berkshire Total critical care time: 40 minutes Critical care time was exclusive of separately billable procedures and treating other patients. Critical care was necessary to treat or prevent imminent or life-threatening deterioration. Critical care was time spent personally by me on the following activities: development of treatment plan with patient and/or surrogate as well as nursing, discussions with consultants, evaluation of patient's response to treatment, examination of patient, obtaining history from patient or surrogate, ordering and performing treatments and interventions, ordering and review of laboratory studies, ordering and review of radiographic studies, pulse oximetry and re-evaluation of patient's condition.                          Medical Decision Making Amount and/or Complexity of Data Reviewed Labs: ordered. Radiology: ordered. ECG/medicine tests: ordered.  Risk OTC drugs. Decision regarding hospitalization.  This patient presents to the ED for concern of fatigue, this involves an extensive number of treatment options, and is a complaint that carries with it a high risk of complications and morbidity.  The differential diagnosis includes infection,   Co morbidities that complicate the patient evaluation  Parkinson's disease, diabetes   Additional history obtained:  Additional  history obtained from [facility] External records from outside source obtained and reviewed including hospital   Lab Tests:  I Ordered, and personally interpreted labs.  The pertinent results include: CBC and chemistries unremarkable except glucose 308   Imaging Studies ordered:  I ordered imaging studies including chest X I independently visualized and interpreted imaging which showed atelectasis I agree with the radiologist interpretation   Cardiac Monitoring: / EKG:  The patient was maintained on a cardiac monitor.  I personally viewed and interpreted the cardiac monitored which showed an underlying rhythm of: Normal sinus rhythm   Consultations Obtained:  I requested consultation with the hospitalist,  and discussed lab and imaging findings as well as pertinent plan - they recommend: Admission   Problem List / ED Course / Critical interventions / Medication management  Febrile illness I ordered medication including antibiotic Reevaluation of the patient after these medicines showed that the patient stayed the same I have reviewed the patients home medicines and have made adjustments as needed   Social Determinants of Health:  None   Test / Admission - Considered:  None  Patient with febrile illness.  Sepsis protocol was done.  No obvious source of infection.  Patient will be admitted to medicine        Final Clinical Impression(s) / ED Diagnoses Final diagnoses:  None    Rx / DC Orders ED Discharge Orders     None         Bethann Berkshire, MD 09/25/21 1201    Bethann Berkshire, MD 09/25/21 1223

## 2021-09-23 NOTE — H&P (Signed)
?History and Physical ? ?Matthew [name] DOB: 1934/08/23 DOA: 09/23/2021 ? ?Referring physician: Dr. Victorino [name], [place] ?PCP: Matthew Pao, MD  ?Outpatient Specialists: Neurology. ?Patient coming from: Home via EMS. ? ?Chief Complaint: Lethargy, fatigue, and fever ? ?HPI: Matthew [name] is a 87 y.o. male with medical history significant for dementia, Parkinson's disease, type 2 diabetes, hyperlipidemia, chronic anxiety/depression who presented from home to [facility] ED brought in by his wife due to lethargy and fatigue, associated with a subjective fever.  EMS was activated.  Blood pressure by EMS was 80/38 and CBG 432.  Patient was brought into the ED for further evaluation and management of his symptomatology.  In the ED, febrile with Tmax 100.7.  UA negative for pyuria.  No leukocytosis.  COVID-19, influenza A, and influenza B PCR are negative.  Chest x-ray nonacute.  Patient is unable to provide a history.  No family members at bedside at the time of this visit.  Patient denies any pain, no nausea or vomiting.  No diarrhea.  No cough.  Patient was treated empirically with antibiotics by EDP for unknown cause of fever.  EDP requested admission due to fever and for further work-up. ? ?ED Course: Tmax 100.7.  BP 108/60, pulse 81, respiratory 20, O2 saturation 93% on room air.  Lab studies remarkable for serum bicarb 20, glucose 308, BUN 25, creatinine 0.96, serum calcium 7.7, albumin 3.2.  Lactic acid 3.0, 2.0.  CBC essentially unremarkable. ? ?Review of Systems: ?Review of systems as noted in the HPI. All other systems reviewed and are negative. ? ? ?Past Medical History:  ?Diagnosis Date  ? Depression   ? Diabetes mellitus ([place])   ? Hyperlipidemia   ? Memory loss   ? Parkinson's disease ([place])   ? ?Past Surgical History:  ?Procedure Laterality Date  ? CATARACT EXTRACTION W/ INTRAOCULAR LENS  IMPLANT, BILATERAL  2013  ? bilateral  ? TONSILLECTOMY  1944  ? ? ?Social History:  reports that he quit smoking  about 47 years ago. His smoking use included cigarettes. He has never used smokeless tobacco. He reports that he does not drink alcohol and does not use drugs. ? ? ?No Known Allergies ? ?Family History  ?Problem Relation Age of Onset  ? Dementia Mother   ? Colon cancer Neg Hx   ? Stomach cancer Neg Hx   ?  ? ? ?Prior to Admission medications   ?Medication Sig Start Date End Date Taking? Authorizing Provider  ?ACCU-CHEK COMPACT PLUS test strip daily. 10/01/13   [provider]  ?ACCU-CHEK SOFTCLIX LANCETS lancets daily. 09/08/13   [provider]  ?atorvastatin (LIPITOR) 40 MG tablet Take 40 mg by mouth daily.    [provider]  ?FLUAD QUADRIVALENT 0.5 ML injection  01/20/19   [provider]  ?JARDIANCE 25 MG TABS tablet Take 25 mg by mouth daily. 03/21/16   [provider]  ?JENTADUETO 2.5-500 MG TABS Take 1 tablet by mouth 2 (two) times daily.  10/27/13   [provider]  ?LORazepam (ATIVAN) 0.5 MG tablet 1 tablet at bedtime. 07/31/13   [provider]  ?NOVOTWIST 32G X 5 MM MISC USE ONE PEN NEEDLE WITH TRESIBA INJECTION DAILY 09/10/17   [provider]  ?PARoxetine (PAXIL) 20 MG tablet  04/10/19   [provider]  ?rOPINIRole (REQUIP) 0.5 MG tablet TAKE 1 TABLET BEFORE FOOD AT SUPPER TIME AND TAKE 1 TABLET AT BEDTIME. TAKE WITH WATER. 01/01/19   Dohmeier, Asencion Partridge, MD  ?  TRESIBA FLEXTOUCH 200 UNIT/ML SOPN INJECT 12 UNITS ONCE DAILY 10/30/17   [provider]  ? ? ?Physical Exam: ?BP 108/60   Pulse 81   Temp (!) 97.5 ?F (36.4 ?C) (Oral)   Resp 20   Ht '5\' 8"'$  (1.727 m)   SpO2 93%   BMI 25.64 kg/m?  ? ?General: 87 y.o. year-old male well developed well nourished in no acute distress.  Somnolent but easily arousable to voices. ?Cardiovascular: Regular rate and rhythm with no rubs or gallops.  No thyromegaly or JVD noted.  No lower extremity edema. 2/4 pulses in all 4 extremities. ?Respiratory: Clear to auscultation with no wheezes or  rales. Good inspiratory effort. ?Abdomen: Soft nontender nondistended with normal bowel sounds x4 quadrants. ?Muskuloskeletal: No cyanosis, clubbing or edema noted bilaterally ?Neuro: CN II-XII intact, strength, sensation, reflexes ?Skin: No ulcerative lesions noted or rashes ?Psychiatry: Mood is appropriate for condition and setting ?   ?   ?   ?Labs on Admission:  ?Basic Metabolic Panel: ?Recent Labs  ?Lab 09/23/21 ?1726  ?NA 139  ?K 4.5  ?CL 109  ?CO2 20*  ?GLUCOSE 308*  ?BUN 25*  ?CREATININE 0.96  ?CALCIUM 7.7*  ? ?Liver Function Tests: ?Recent Labs  ?Lab 09/23/21 ?1726  ?AST 23  ?ALT 11  ?ALKPHOS 62  ?BILITOT 1.1  ?PROT 6.3*  ?ALBUMIN 3.2*  ? ?No results for input(s): LIPASE, AMYLASE in the last 168 hours. ?No results for input(s): AMMONIA in the last 168 hours. ?CBC: ?Recent Labs  ?Lab 09/23/21 ?1550  ?WBC 5.9  ?NEUTROABS 4.9  ?HGB 15.7  ?HCT 45.5  ?MCV 95.2  ?PLT 171  ? ?Cardiac Enzymes: ?No results for input(s): CKTOTAL, CKMB, CKMBINDEX, TROPONINI in the last 168 hours. ? ?BNP (last 3 results) ?No results for input(s): BNP in the last 8760 hours. ? ?ProBNP (last 3 results) ?No results for input(s): PROBNP in the last 8760 hours. ? ?CBG: ?Recent Labs  ?Lab 09/23/21 ?1545  ?GLUCAP 349*  ? ? ?Radiological Exams on Admission: ?DG Chest Port 1 View ? ?Result Date: 09/23/2021 ?CLINICAL DATA:  Fatigue, lethargy, and fever. Hypotension. Hyperglycemia. Falls today. EXAM: PORTABLE CHEST 1 VIEW COMPARISON:  06/10/2014 FINDINGS: Subsegmental atelectasis or scarring at the left lung base. The lungs appear otherwise clear. Heart size within normal limits. Mild thoracic spondylosis. IMPRESSION: 1. Linear subsegmental atelectasis or scarring at the left lung base. 2. Mild thoracic spondylosis. Electronically Signed   By: Van Clines M.D.   On: 09/23/2021 16:35   ? ?EKG: I independently viewed the EKG done and my findings are as followed: Sinus rhythm rate of 91.  Nonspecific ST-T changes.  QTc  424. ? ?Assessment/Plan ?Present on Admission: ? Fever ? ?Principal Problem: ?  Fever ? ?Fever, unclear cause ?Work-up with no evidence of active infective process. ?In the ED Tmax 100.7. ?UA, chest x-ray nonrevealing. ?Nonseptic appearing. ?Follow cultures ?Received Rocephin 2 g x1 dose in the ED. ?Start gentle IV fluid hydration ?Hold off antibiotics for now and continue to follow blood cultures and urine culture ?Monitor fever curve and WBC ?Monitor vital signs ? ?Lactic acidosis, unclear cause ?Lactic acid on presentation 3.0, downtrending to 2.0 ?Continue IV fluid hydration ?Repeat lactic acid level in the morning. ?Follow EEG ? ?Acute metabolic acidosis of unclear cause ?We will obtain EEG to rule out any seizure activity ?Treat underlying conditions ?IV fluid hydration ?Reorient as needed ?Fall precautions ? ?Non anion gap metabolic acidosis ?Serum bicarb 20, anion gap 11 ?IV fluid hydration ?Repeat  labs in the morning ? ?Type 2 diabetes with hyperglycemia ?Obtain hemoglobin A1c ?Start insulin sliding scale ? ?Parkinson's disease/dementia ?Resume home regimen ? ?Generalized weakness ?PT OT to assess ?Fall precautions ? ? ? ?DVT prophylaxis: Subcu Lovenox daily ? ?Code Status: Full code ? ?Family Communication: None at bedside ? ?Disposition Plan: Admitted to telemetry unit ? ?Consults called: None. ? ?Admission status: Observation status. ? ? ?Status is: Observation ? ? ? ?Kayleen Memos MD ?[facility] ?Pager [phone] ? ?If 7PM-7AM, please contact night-coverage ?[email or web address] ?Password [email or web address] ? ?09/23/2021, 8:47 PM   ?

## 2021-09-24 DIAGNOSIS — G20 Parkinson's disease: Secondary | ICD-10-CM

## 2021-09-24 DIAGNOSIS — K567 Ileus, unspecified: Secondary | ICD-10-CM | POA: Diagnosis not present

## 2021-09-24 DIAGNOSIS — R509 Fever, unspecified: Secondary | ICD-10-CM | POA: Diagnosis not present

## 2021-09-24 LAB — CBC WITH DIFFERENTIAL/PLATELET
Abs Immature Granulocytes: 0.02 10*3/uL (ref 0.00–0.07)
Basophils Absolute: 0 10*3/uL (ref 0.0–0.1)
Basophils Relative: 1 %
Eosinophils Absolute: 0 10*3/uL (ref 0.0–0.5)
Eosinophils Relative: 1 %
HCT: 40.8 % (ref 39.0–52.0)
Hemoglobin: 14.4 g/dL (ref 13.0–17.0)
Immature Granulocytes: 1 %
Lymphocytes Relative: 24 %
Lymphs Abs: 1 10*3/uL (ref 0.7–4.0)
MCH: 34 pg (ref 26.0–34.0)
MCHC: 35.3 g/dL (ref 30.0–36.0)
MCV: 96.5 fL (ref 80.0–100.0)
Monocytes Absolute: 0.4 10*3/uL (ref 0.1–1.0)
Monocytes Relative: 9 %
Neutro Abs: 2.7 10*3/uL (ref 1.7–7.7)
Neutrophils Relative %: 64 %
Platelets: 145 10*3/uL — ABNORMAL LOW (ref 150–400)
RBC: 4.23 MIL/uL (ref 4.22–5.81)
RDW: 13.9 % (ref 11.5–15.5)
WBC: 4.1 10*3/uL (ref 4.0–10.5)
nRBC: 0 % (ref 0.0–0.2)

## 2021-09-24 LAB — HEMOGLOBIN A1C
Hgb A1c MFr Bld: 11 % — ABNORMAL HIGH (ref 4.8–5.6)
Mean Plasma Glucose: 269 mg/dL

## 2021-09-24 LAB — GLUCOSE, CAPILLARY
Glucose-Capillary: 235 mg/dL — ABNORMAL HIGH (ref 70–99)
Glucose-Capillary: 259 mg/dL — ABNORMAL HIGH (ref 70–99)

## 2021-09-24 LAB — CBG MONITORING, ED
Glucose-Capillary: 119 mg/dL — ABNORMAL HIGH (ref 70–99)
Glucose-Capillary: 171 mg/dL — ABNORMAL HIGH (ref 70–99)
Glucose-Capillary: 222 mg/dL — ABNORMAL HIGH (ref 70–99)

## 2021-09-24 LAB — LACTIC ACID, PLASMA
Lactic Acid, Venous: 1.2 mmol/L (ref 0.5–1.9)
Lactic Acid, Venous: 1.7 mmol/L (ref 0.5–1.9)

## 2021-09-24 LAB — PHOSPHORUS: Phosphorus: 2.7 mg/dL (ref 2.5–4.6)

## 2021-09-24 LAB — MAGNESIUM: Magnesium: 2.3 mg/dL (ref 1.7–2.4)

## 2021-09-24 MED ORDER — CHLORHEXIDINE GLUCONATE CLOTH 2 % EX PADS
6.0000 | MEDICATED_PAD | Freq: Every day | CUTANEOUS | Status: DC
  Administered 2021-09-24 – 2021-09-30 (×6): 6 via TOPICAL

## 2021-09-24 MED ORDER — PAROXETINE HCL 10 MG PO TABS
10.0000 mg | ORAL_TABLET | Freq: Every day | ORAL | Status: DC
  Administered 2021-09-24 – 2021-09-30 (×7): 10 mg via ORAL
  Filled 2021-09-24 (×7): qty 1

## 2021-09-24 MED ORDER — ROPINIROLE HCL 0.5 MG PO TABS
0.2500 mg | ORAL_TABLET | Freq: Two times a day (BID) | ORAL | Status: DC
  Administered 2021-09-24 – 2021-09-30 (×12): 0.25 mg via ORAL
  Filled 2021-09-24 (×12): qty 1

## 2021-09-24 MED ORDER — CYCLOBENZAPRINE HCL 5 MG PO TABS
5.0000 mg | ORAL_TABLET | Freq: Three times a day (TID) | ORAL | Status: DC | PRN
  Administered 2021-09-24 – 2021-09-28 (×2): 5 mg via ORAL
  Filled 2021-09-24 (×2): qty 1

## 2021-09-24 NOTE — Evaluation (Signed)
Occupational Therapy Evaluation ?Patient Details ?Name: Matthew [name] ?MRN: [id number] ?DOB: Sep 22, 1934 ?Today's Date: 09/24/2021 ? ? ?History of Present Illness Matthew [name] is a 87 y.o. male with medical history significant for dementia, Parkinson's disease, type 2 diabetes, hyperlipidemia, chronic anxiety/depression who presented from home to [name] brought in by his wife due to lethargy and fatigue, associated with a subjective fever.  EMS was activated.  Blood pressure by EMS was 80/38 and CBG 432  ? ?Clinical Impression ?  ?MR. Lindel Marcell is an 87 year old man who presents with above medical history. On evaluation he demonstrates good upper body strength but generalized weakness, decreased activity tolerance, impaired balance, and pain. Patient supine in bed when therapist entered and reported neck pain and wanting to get up. Patient max assist to transfer to edge of bed needing assistance with legs, trunk negotiation and scooting forward. He exhibited a right lateral lean at edge of bed and even once in recliner. Patient mod assist to initially rise from bedside but min assist from recliner using chair arms. He was only able to take a couple of steps in room with RW- with verbal instruction for sequencing - exhibiting difficulty with weight bearing through RLE (varus deformity and history of right knee pain) and festinating gait. Of note - patient with known Parkinson's - and has not received any Parkinson's medication since being in the ED. Patient typically has assistance for LB ADLs but is able to ambulate to bathroom and perform toileting independently and manage his clothing from his knees. He is now requiring increased assistance for mobility and ADLs. He also reports recent falls at home. Patient will benefit from skilled OT services while in hospital to improve deficits and learn compensatory strategies as needed in order to return to PLOF.  Recommend short term rehab at discharge. ?    ? ?Recommendations for follow up therapy are one component of a multi-disciplinary discharge planning process, led by the attending physician.  Recommendations may be updated based on patient status, additional functional criteria and insurance authorization.  ? ?Follow Up Recommendations ? Skilled nursing-short term rehab (<3 hours/day)  ?  ?Assistance Recommended at Discharge Frequent or constant Supervision/Assistance  ?Patient can return home with the following A little help with walking and/or transfers;A lot of help with bathing/dressing/bathroom;Assistance with cooking/housework;Direct supervision/assist for financial management;Direct supervision/assist for medications management;Help with stairs or ramp for entrance ? ?  ?Functional Status Assessment ? Patient has had a recent decline in their functional status and demonstrates the ability to make significant improvements in function in a reasonable and predictable amount of time.  ?Equipment Recommendations ? None recommended by OT  ?  ?Recommendations for Other Services   ? ? ?  ?Precautions / Restrictions Precautions ?Precautions: Fall ?Precaution Comments: several falls per patient ?Restrictions ?Weight Bearing Restrictions: No  ? ?  ? ?Mobility Bed Mobility ?  ?  ?  ?  ?  ?  ?  ?  ?  ? ?Transfers ?  ?  ?  ?  ?  ?  ?  ?  ?  ?  ?  ? ?  ?Balance Overall balance assessment: Needs assistance ?Sitting-balance support: No upper extremity supported, Feet supported ?Sitting balance-Leahy Scale: Poor ?  ?Postural control: Right lateral lean ?Standing balance support: During functional activity, Reliant on assistive device for balance ?Standing balance-Leahy Scale: Poor ?  ?  ?  ?  ?  ?  ?  ?  ?  ?  ?  ?  ?   ? ?  ADL either performed or assessed with clinical judgement  ? ?ADL Overall ADL's : Needs assistance/impaired ?Eating/Feeding: Set up;Sitting ?  ?Grooming: Set up;Sitting ?  ?Upper Body Bathing: Sitting;Minimal assistance ?  ?Lower Body Bathing: Maximal  assistance;Sitting/lateral leans ?  ?Upper Body Dressing : Minimal assistance ?  ?Lower Body Dressing: Maximal assistance;Sit to/from stand ?  ?Toilet Transfer: BSC/3in1;Minimal assistance;Rolling walker (2 wheels) ?Toilet Transfer Details (indicate cue type and reason): increased time to power up ?Toileting- Clothing Manipulation and Hygiene: Sit to/from stand;Maximal assistance ?  ?  ?  ?Functional mobility during ADLs: Rolling walker (2 wheels);+2 for safety/equipment;Minimal assistance ?   ? ? ? ?Vision Patient Visual Report: No change from baseline ?   ?   ?Perception   ?  ?Praxis   ?  ? ?Pertinent Vitals/Pain Pain Assessment ?Pain Assessment: 0-10 ?Pain Location: neck pain ?Pain Intervention(s): Monitored during session, Repositioned  ? ? ? ?Hand Dominance Left ?  ?Extremity/Trunk Assessment Upper Extremity Assessment ?Upper Extremity Assessment: RUE deficits/detail;LUE deficits/detail ?RUE Deficits / Details: WFL ROM, grossly 4+/5 strength ?RUE Sensation: WNL ?RUE Coordination:  (functional coordination) ?LUE Deficits / Details: WFL ROM, 4+/5 strength ?LUE Coordination:  (reports tremor in left hand) ?  ?Lower Extremity Assessment ?Lower Extremity Assessment: RLE deficits/detail;LLE deficits/detail (Simultaneous filing. User may not have seen previous data.) ?  ?Cervical / Trunk Assessment ?Cervical / Trunk Assessment: Kyphotic ?  ?Communication Communication ?Communication: Expressive difficulties;HOH (had difficulty  expressing thoughts) ?  ?Cognition   ?Behavior During Therapy: [facility] for tasks assessed/performed ?Overall Cognitive Status: No family/caregiver present to determine baseline cognitive functioning ?  ?  ?  ?  ?  ?  ?  ?  ?  ?Orientation Level: Time, Situation ?  ?Memory: Decreased short-term memory ?  ?  ?  ?  ?  ?  ?  ?General Comments    ? ?  ?Exercises   ?  ?Shoulder Instructions    ? ? ?Home Living Family/patient expects to be discharged to:: Private residence ?Living Arrangements:  Spouse/significant other ?Available Help at Discharge: Family;Available 24 hours/day ?Type of Home: House ?Home Access: Stairs to enter ?Entrance Stairs-Number of Steps: 2-3 ?Entrance Stairs-Rails: Right;Left ?Home Layout: One level ?  ?  ?Bathroom Shower/Tub: Walk-in shower ?  ?Bathroom Toilet: Handicapped height ?  ?  ?Home Equipment: Shower Land (2 wheels);BSC/3in1 ?  ?  ?  ? ?  ?Prior Functioning/Environment Prior Level of Function : Needs assist ?  ?  ?  ?  ?  ?  ?Mobility Comments: uses a walker ?ADLs Comments: wife assists with ADLs, aide 3 x a week for threes - can  toilet on his own ?  ? ?  ?  ?OT Problem List: Decreased activity tolerance;Impaired balance (sitting and/or standing);Decreased knowledge of use of DME or AE;Decreased safety awareness;Pain;Decreased strength ?  ?   ?OT Treatment/Interventions: Self-care/ADL training;Therapeutic exercise;DME and/or AE instruction;Therapeutic activities;Balance training;Patient/family education  ?  ?OT Goals(Current goals can be found in the care plan section) Acute Rehab OT Goals ?Patient Stated Goal: walk better ?OT Goal Formulation: With patient ?Time For Goal Achievement: 10/08/21 ?Potential to Achieve Goals: Good  ?OT Frequency: Min 2X/week ?  ? ?Co-evaluation PT/OT/SLP Co-Evaluation/Treatment: Yes ?Reason for Co-Treatment: To address functional/ADL transfers;For patient/therapist safety ?PT goals addressed during session: Mobility/safety with mobility ?OT goals addressed during session: ADL's and self-care ?  ? ?  ?AM-PAC OT "6 Clicks" Daily Activity     ?Outcome Measure Help from another person eating meals?: A Little ?  Help from another person taking care of personal grooming?: A Little ?Help from another person toileting, which includes using toliet, bedpan, or urinal?: A Lot ?Help from another person bathing (including washing, rinsing, drying)?: A Lot ?Help from another person to put on and taking off regular upper body clothing?: A  Little ?Help from another person to put on and taking off regular lower body clothing?: A Lot ?6 Click Score: 15 ?  ?End of Session Equipment Utilized During Treatment: Rolling walker (2 wheels);Gait belt ?Nurse Communication

## 2021-09-24 NOTE — Evaluation (Addendum)
Physical Therapy Evaluation Patient Details Name: [name] MRN: [id number] DOB: [birth year] Today's Date: 09/24/2021  History of Present Illness  Matthew [name] is a 87 y.o. male with medical history significant for dementia, Parkinson's disease, type 2 diabetes, hyperlipidemia, chronic anxiety/depression who presented from home to [facility] ED brought in by his wife due to lethargy and fatigue, associated with a subjective fever.  EMS was activated.  Blood pressure by EMS was 80/38 and CBG 432  Clinical Impression  The patient presents with poor balance seated and standing, listing to the right. Gait  is festinating. Right knee varus deformity and knee flexed in stance. Patient reports that he has PD and falls. Patient also demonstrates decreased expression when stating where he has pain. No family present .    The patient states he does not see a neurologist for the PD. Notes in Epic indicate that he has in past and has an Appointment  In 6/23 with [facility].  Looks like he was on levadopa in 2020, do not see it listed  currently. Pt admitted with above diagnosis.  Pt currently with functional limitations due to the deficits listed below (see PT Problem List). Pt will benefit from skilled PT to increase their independence and safety with mobility to allow discharge to the venue listed below.        Recommendations for follow up therapy are one component of a multi-disciplinary discharge planning process, led by the attending physician.  Recommendations may be updated based on patient status, additional functional criteria and insurance authorization.  Follow Up Recommendations Skilled nursing-short term rehab (<3 hours/day)    Assistance Recommended at Discharge  Frequent/constant assistance  Patient can return home with the following  A lot of help with walking and/or transfers;A lot of help with bathing/dressing/bathroom;Assistance with cooking/housework;Assist for transportation    Equipment  Recommendations None recommended by PT  Recommendations for Other Services       Functional Status Assessment Patient has had a recent decline in their functional status and demonstrates the ability to make significant improvements in function in a reasonable and predictable amount of time.     Precautions / Restrictions Precautions Precautions: Fall Precaution Comments: several falls per patient Restrictions Weight Bearing Restrictions: No      Mobility  Bed Mobility Overal bed mobility: Needs Assistance Bed Mobility: Supine to Sit     Supine to sit: Max assist, +2 for safety/equipment, HOB elevated     General bed mobility comments: assisted with trunk to sit upright, heavily leans to right initially, assist to scoot forward to bed edge    Transfers Overall transfer level: Needs assistance Equipment used: Rolling walker (2 wheels) Transfers: Sit to/from Stand Sit to Stand: Mod assist, +2 physical assistance, +2 safety/equipment           General transfer comment: support to power up to stand.From bed, min assist and extra time to rise from recliner    Ambulation/Gait Ambulation/Gait assistance: Mod assist, +2 safety/equipment Gait Distance (Feet): 4 Feet (x2) Assistive device: Rolling walker (2 wheels) Gait Pattern/deviations: Decreased step length - right, Decreased step length - left, Knee flexed in stance - right, Festinating Gait velocity: decr     General Gait Details: decreased weight shifting to step  , right leaning, right knee flexed in stance  Stairs            Wheelchair Mobility    Modified Rankin (Stroke Patients Only)       Balance Overall balance assessment:  Needs assistance Sitting-balance support: No upper extremity supported, Feet supported Sitting balance-Leahy Scale: Poor   Postural control: Right lateral lean Standing balance support: During functional activity, Reliant on assistive device for balance Standing balance-Leahy  Scale: Poor Standing balance comment: right lateral lean                             Pertinent Vitals/Pain Pain Assessment Pain Assessment: Faces Faces Pain Scale: Hurts little more    Home Living Family/patient expects to be discharged to:: Private residence Living Arrangements: Spouse/significant other Available Help at Discharge: Family;Available 24 hours/day Type of Home: House Home Access: Stairs to enter Entrance Stairs-Rails: Doctor, general practice of Steps: 2-3   Home Layout: One level Home Equipment: Pharmacist, hospital (2 wheels);BSC/3in1      Prior Function Prior Level of Function : Needs assist             Mobility Comments: uses a walker ADLs Comments: wife assists with ADLs, aide 3 x a week for threes - can  toilet on his own     Hand Dominance   Dominant Hand: Left    Extremity/Trunk Assessment   Upper Extremity Assessment Upper Extremity Assessment: RUE deficits/detail;LUE deficits/detail RUE Deficits / Details: WFL ROM, grossly 4+/5 strength RUE Sensation: WNL RUE Coordination:  (functional coordination) LUE Deficits / Details: WFL ROM, 4+/5 strength LUE Coordination:  (reports tremor in left hand)    Lower Extremity Assessment Lower Extremity Assessment: RLE deficits/detail;LLE deficits/detail (Simultaneous filing. User may not have seen previous data.) RLE Deficits / Details: note genu varus deformity    Cervical / Trunk Assessment Cervical / Trunk Assessment: Kyphotic;Other exceptions Cervical / Trunk Exceptions: head lateral bend to right  Communication   Communication: Expressive difficulties;HOH (had difficulty  expressing thoughts)  Cognition Arousal/Alertness: Awake/alert Behavior During Therapy: WFL for tasks assessed/performed Overall Cognitive Status: No family/caregiver present to determine baseline cognitive functioning Area of Impairment: Orientation, Memory, Awareness, Following commands                  Orientation Level: Time, Situation   Memory: Decreased short-term memory Following Commands: Follows one step commands with increased time   Awareness: Emergent            General Comments      Exercises     Assessment/Plan    PT Assessment Patient needs continued PT services  PT Problem List Decreased strength;Decreased balance;Decreased cognition;Decreased knowledge of precautions;Decreased range of motion;Decreased mobility;Decreased knowledge of use of DME;Decreased activity tolerance;Decreased safety awareness;Pain       PT Treatment Interventions DME instruction;Functional mobility training;Balance training;Patient/family education;Gait training;Therapeutic activities;Neuromuscular re-education;Stair training;Therapeutic exercise;Cognitive remediation    PT Goals (Current goals can be found in the Care Plan section)  Acute Rehab PT Goals Patient Stated Goal: to not fall PT Goal Formulation: With patient Time For Goal Achievement: 10/08/21 Potential to Achieve Goals: Fair    Frequency Min 2X/week     Co-evaluation PT/OT/SLP Co-Evaluation/Treatment: Yes Reason for Co-Treatment: To address functional/ADL transfers;For patient/therapist safety PT goals addressed during session: Mobility/safety with mobility OT goals addressed during session: ADL's and self-care       AM-PAC PT "6 Clicks" Mobility  Outcome Measure Help needed turning from your back to your side while in a flat bed without using bedrails?: A Lot Help needed moving from lying on your back to sitting on the side of a flat bed without using bedrails?: A Lot Help needed moving  to and from a bed to a chair (including a wheelchair)?: A Lot Help needed standing up from a chair using your arms (e.g., wheelchair or bedside chair)?: A Lot Help needed to walk in hospital room?: Total Help needed climbing 3-5 steps with a railing? : Total 6 Click Score: 10    End of Session Equipment  Utilized During Treatment: Gait belt Activity Tolerance: Patient limited by fatigue Patient left: in chair;with call bell/phone within reach Nurse Communication: Mobility status PT Visit Diagnosis: Unsteadiness on feet (R26.81);Repeated falls (R29.6);Difficulty in walking, not elsewhere classified (R26.2);Other symptoms and signs involving the nervous system (R29.898)    Time: 1610-9604 PT Time Calculation (min) (ACUTE ONLY): 29 min   Charges:   PT Evaluation $PT Eval Low Complexity: 1 Low          Blanchard Kelch PT Acute Rehabilitation Services Pager [phone] Office [phone]   Rada Hay 09/24/2021, 1:07 PM

## 2021-09-24 NOTE — Progress Notes (Signed)
Bed request placed for transfer to telemetry per new order placed by Dr. Nevada Crane. Awaiting bed assisnment. ?

## 2021-09-24 NOTE — Progress Notes (Signed)
?  Progress Note ? ? ?Patient: [name] ZOX:[id number] DOB: 17-Sep-1934 DOA: 09/23/2021     0 ?DOS: the patient was seen and examined on 09/24/2021 ?  ?Brief hospital course: ?87 y.o. male with medical history significant for dementia, Parkinson's disease, type 2 diabetes, hyperlipidemia, chronic anxiety/depression who presented from home to [facility] ED brought in by his wife due to lethargy and fatigue, associated with a subjective fever.  EMS was activated.  Blood pressure by EMS was 80/38 and CBG 432.  Patient was brought into the ED for further evaluation and management of his symptomatology.  In the ED, febrile with Tmax 100.7.  UA negative for pyuria.  No leukocytosis.  COVID-19, influenza A, and influenza B PCR are negative.  Chest x-ray nonacute. ? ?Assessment and Plan: ?No notes have been filed under this hospital service. ?Service: Hospitalist ? ?Fever, unclear cause ?Work-up with no evidence of active infective process. ?In the ED Tmax 100.7 rectally ?UA, chest x-ray nonrevealing. ?No leukocytosis ?Pt has since remained afebrile off abx ?Blood and urine cx pending, thus far no growth ?  ?Lactic acidosis, unclear cause ?Lactic acid on presentation 3.0, downtrending to 2.0 ?Continue IV fluid hydration ?Repeat lactic acid level in the morning. ?EEG was ordered at time of presentation ?  ?Acute metabolic acidosis of unclear cause ?Given IV fluid hydration ?Reorient as needed ?Fall precautions ?Therapy recs for PT/OT ?  ?Non anion gap metabolic acidosis ?Serum bicarb 20, anion gap 11 ?IV fluid hydration ?Repeat labs in the morning ?  ?Type 2 diabetes with hyperglycemia ?Obtain hemoglobin A1c ?Start insulin sliding scale ?  ?Parkinson's disease/dementia ?Not on meds per [facility] ?Chart reviewed. Pt has been lost to follow up with Neuorology, was last seen in 2020 where pt was on Requip at that time. Pt had since been lost to follow up, again, requip not on current MAR ?Discussed case with Neurology. Recommendation to  cont on 0.'25mg'$  requip for now ?Pt scheduled to see Neurology next month, would defer to outpt Neuro to uptitrate dose ?  ?Generalized weakness ?PT OT with recs for SNF ?TOC consulted ?Fall precautions ?  ? ?  ? ?Subjective: Reports feeling weak ? ?Physical Exam: ?Vitals:  ? 09/24/21 0600 09/24/21 0630 09/24/21 0900 09/24/21 1330  ?BP: 104/76 108/62 (!) 103/58 (!) 93/44  ?Pulse: 76 78 81 76  ?Resp: (!) 21 (!) 21 (!) 21 18  ?Temp:    99 ?F (37.2 ?C)  ?TempSrc:      ?SpO2: 96% 96% 96% 97%  ?Weight:      ?Height:      ? ?General exam: Awake, laying in bed, in nad ?Respiratory system: Normal respiratory effort, no wheezing ?Cardiovascular system: regular rate, s1, s2 ?Gastrointestinal system: Soft, nondistended, positive BS ?Central nervous system: CN2-12 grossly intact, strength intact ?Extremities: Perfused, no clubbing ?Skin: Normal skin turgor, no notable skin lesions seen ?Psychiatry: Mood normal // no visual hallucinations  ? ?Data Reviewed: ? ?Labs reviewed: Mg 2.3, WBC 4.1  ? ?Family Communication: Pt in room, family not at bedside ? ?Disposition: ?Status is: Observation ?The patient remains OBS appropriate and will d/c before 2 midnights. ? Planned Discharge Destination: Skilled nursing facility ? ? ? ? ?Author: ?Marylu Lund, MD ?09/24/2021 4:15 PM ? ?For on call review [email or web address].  ?

## 2021-09-24 NOTE — Hospital Course (Signed)
87 y.o. male with medical history significant for dementia, Parkinson's disease, type 2 diabetes, hyperlipidemia, chronic anxiety/depression who presented from home to [facility] ED brought in by his wife due to lethargy and fatigue, associated with a subjective fever.  EMS was activated.  Blood pressure by EMS was 80/38 and CBG 432.  Patient was brought into the ED for further evaluation and management of his symptomatology.  In the ED, febrile with Tmax 100.7.  UA negative for pyuria.  No leukocytosis.  COVID-19, influenza A, and influenza B PCR are negative.  Chest x-ray nonacute. ?

## 2021-09-24 NOTE — ED Notes (Signed)
Patient repositioned in bed for comfort.

## 2021-09-25 ENCOUNTER — Observation Stay (HOSPITAL_COMMUNITY): Payer: Medicare HMO

## 2021-09-25 DIAGNOSIS — L8990 Pressure ulcer of unspecified site, unspecified stage: Secondary | ICD-10-CM | POA: Diagnosis present

## 2021-09-25 DIAGNOSIS — R509 Fever, unspecified: Secondary | ICD-10-CM

## 2021-09-25 DIAGNOSIS — K567 Ileus, unspecified: Secondary | ICD-10-CM | POA: Diagnosis not present

## 2021-09-25 DIAGNOSIS — R112 Nausea with vomiting, unspecified: Secondary | ICD-10-CM | POA: Diagnosis not present

## 2021-09-25 DIAGNOSIS — G20 Parkinson's disease: Secondary | ICD-10-CM | POA: Diagnosis not present

## 2021-09-25 LAB — COMPREHENSIVE METABOLIC PANEL
ALT: 10 U/L (ref 0–44)
AST: 19 U/L (ref 15–41)
Albumin: 2.8 g/dL — ABNORMAL LOW (ref 3.5–5.0)
Alkaline Phosphatase: 58 U/L (ref 38–126)
Anion gap: 6 (ref 5–15)
BUN: 26 mg/dL — ABNORMAL HIGH (ref 8–23)
CO2: 22 mmol/L (ref 22–32)
Calcium: 7.8 mg/dL — ABNORMAL LOW (ref 8.9–10.3)
Chloride: 108 mmol/L (ref 98–111)
Creatinine, Ser: 0.74 mg/dL (ref 0.61–1.24)
GFR, Estimated: >60 mL/min (ref >60)
Glucose, Bld: 199 mg/dL — ABNORMAL HIGH (ref 70–99)
Potassium: 3.7 mmol/L (ref 3.5–5.1)
Sodium: 136 mmol/L (ref 135–145)
Total Bilirubin: 0.7 mg/dL (ref 0.3–1.2)
Total Protein: 5.7 g/dL — ABNORMAL LOW (ref 6.5–8.1)

## 2021-09-25 LAB — URINE CULTURE: Culture: NO GROWTH

## 2021-09-25 LAB — GLUCOSE, CAPILLARY
Glucose-Capillary: 185 mg/dL — ABNORMAL HIGH (ref 70–99)
Glucose-Capillary: 245 mg/dL — ABNORMAL HIGH (ref 70–99)
Glucose-Capillary: 203 mg/dL — ABNORMAL HIGH (ref 70–99)
Glucose-Capillary: 216 mg/dL — ABNORMAL HIGH (ref 70–99)

## 2021-09-25 MED ORDER — POLYETHYLENE GLYCOL 3350 17 G PO PACK
17.0000 g | PACK | Freq: Every day | ORAL | Status: DC
  Administered 2021-09-25 – 2021-09-30 (×4): 17 g via ORAL
  Filled 2021-09-25 (×5): qty 1

## 2021-09-25 NOTE — Progress Notes (Signed)
Patient sleeping in bed, appears comfortable, VSS, no acute signs of distress, will continue to monitor. ?

## 2021-09-25 NOTE — Progress Notes (Addendum)
?Progress Note ? ? ?Patient: Matthew [name] YSA:[id number] DOB: Sep 17, 1934 DOA: 09/23/2021     0 ?DOS: the patient was seen and examined on 09/25/2021 ?  ?Brief hospital course: ?87 y.o. male with medical history significant for dementia, Parkinson's disease, type 2 diabetes, hyperlipidemia, chronic anxiety/depression who presented from home to [facility] ED brought in by his wife due to lethargy and fatigue, associated with a subjective fever.  EMS was activated.  Blood pressure by EMS was 80/38 and CBG 432.  Patient was brought into the ED for further evaluation and management of his symptomatology.  In the ED, febrile with Tmax 100.7.  UA negative for pyuria.  No leukocytosis.  COVID-19, influenza A, and influenza B PCR are negative.  Chest x-ray nonacute. ? ?Assessment and Plan: ?No notes have been filed under this hospital service. ?Service: Hospitalist ? ?Fever, unclear cause ?Work-up with no evidence of active infective process. ?In the ED Tmax 100.7 rectally. Of note, pt reports over one week hx of constipation. Moderate sized stool today. Question validity of rectal temp at presentation ?UA, chest x-ray nonrevealing. ?No leukocytosis ?Pt has since remained afebrile off abx ?Blood and urine cx pending, thus far no growth ?  ?Lactic acidosis, unclear cause ?Lactic acid on presentation 3.0, downtrending to 2.0 ?Continue IV fluid hydration ?Lactate normalized ?EEG was ordered at time of presentation ?  ?Acute metabolic acidosis of unclear cause ?Given IV fluid hydration ?Reorient as needed ?Fall precautions ?PT/OT recs for SNF noted. Family has requested home with [facility] services instead ?  ?Non anion gap metabolic acidosis ?Serum bicarb 20, anion gap 11 ?IV fluid hydration ?Normalized ?  ?Type 2 diabetes with hyperglycemia ?Obtain hemoglobin A1c ?Start insulin sliding scale ?  ?Parkinson's disease/dementia ?Not on meds per [facility] ?Chart reviewed. Pt has been lost to follow up with Neuorology, was last seen in 2020 where pt was  on Requip at that time. Pt had since been lost to follow up, again, requip not on current MAR ?Discussed case with Neurology. Recommendation to cont on 0.'25mg'$  requip for now ?Pt scheduled to see Neurology next month, would defer to outpt Neuro to uptitrate dose ?  ?Generalized weakness ?PT OT with recs for SNF ?TOC consulted, family has since desired home with [facility] instead ?Fall precautions ?  ?Ileus ?-Nausea this AM with distended abd ?-Ordered and reviewed abd xray. Air-filled prominent loops of colon with findings suggesting colonic ileus ?-Pt with moderate BM this AM and continues to pass flatus ?-Cont scheduled bowel regimen ?  ? ?Subjective: Nauseated this AM ? ?Physical Exam: ?Vitals:  ? 09/24/21 2048 09/25/21 0116 09/25/21 0612 09/25/21 1249  ?BP: (!) 117/53 130/67 136/72 127/69  ?Pulse: 76 68 72 79  ?Resp: '14 14 16 15  '$ ?Temp: 98 ?F (36.7 ?C) 98.4 ?F (36.9 ?C) 98.7 ?F (37.1 ?C) 98.2 ?F (36.8 ?C)  ?TempSrc: Oral Oral Oral Oral  ?SpO2: 98% 96% 93% 93%  ?Weight:      ?Height:      ? ?General exam: Conversant, in no acute distress ?Respiratory system: normal chest rise, clear, no audible wheezing ?Cardiovascular system: regular rhythm, s1-s2 ?Gastrointestinal system: distended, nontender, decreased BS ?Central nervous system: No seizures, no tremors ?Extremities: No cyanosis, no joint deformities ?Skin: No rashes, no pallor ?Psychiatry: Affect normal // no auditory hallucinations  ? ?Data Reviewed: ? ?Labs reviewed: Cr 0.74  ? ?Family Communication: Pt in room, family not at bedside ? ?Disposition: ?Status is: Observation ?The patient remains OBS appropriate and will d/c before  2 midnights. ? Planned Discharge Destination: Skilled nursing facility ? ? ? ?Author: ?Marylu Lund, MD ?09/25/2021 2:59 PM ? ?For on call review [email or web address].  ?

## 2021-09-25 NOTE — Plan of Care (Signed)
  Problem: Safety: Goal: Ability to remain free from injury will improve Outcome: Progressing   Problem: Coping: Goal: Level of anxiety will decrease Outcome: Progressing   

## 2021-09-25 NOTE — Plan of Care (Signed)

## 2021-09-25 NOTE — TOC Progression Note (Addendum)
Transition of Care (TOC) - Progression Note  ? ? ?Patient Details  ?Name: Matthew [name] ?MRN: [id number] ?Date of Birth: Apr 01, 1935 ? ?Transition of Care (TOC) CM/SW Contact  ?Ross Ludwig, LCSW ?Phone Number: ?09/25/2021, 10:35 AM ? ?Clinical Narrative:    ? ?CSW attempted to contact patient, his wife, and his son to discuss SNF recommendations.  CSW left message on voice mail awaiting a call back.  Patient has [employer] and authorization will need to be completed.  Patient has been faxed out awaiting bed offers. ? ? ?1:30pm  CSW received phone call back from patient's daughter in law and son.  CSW discussed PT recommendations for SNF placement.  After discussion with patient's family they have decided they would rather patient go home with home health.  CSW asked if they had a preference for home health agencies, and they did not.  CSW to make a referral for [facility] PT, OT, RN, Aide, and Soc Work.  Per patient's son and daughter in law they live in [place] and are working on trying to find an ALF for patient and his wife to go to.  In the mean time they would like [facility] services.  CSW updated attending physician. ? ?3:30pm  CSW left message for Cindie at [place] and [place] at [place].  Also left message with Jeninfer at [facility] to see if they can accept patient for home health.  Awaiting for a call back. ? ?5:00pm  CSW received message back from [place] at [place], they can accept patient referral.  Outpatient palliative referral made to [place]. ? ? ?  ?  ? ?Expected Discharge Plan and Services ?  ? Home with home health services. ?  ?  ?  ?                ?  ?  ?  ?  ?  ?  ?  ?  ?  ?  ? ? ?Social Determinants of Health (SDOH) Interventions ?  ? ?Readmission Risk Interventions ?   ? View : No data to display.  ?  ?  ?  ? ? ?

## 2021-09-25 NOTE — [place] FL2 (Signed)
?[place] MEDICAID FL2 LEVEL OF CARE SCREENING TOOL  ?  ? ?IDENTIFICATION  ?Patient Name: ?Matthew [name] Birthdate: 1934/08/30 Sex: male Admission Date (Current Location): ?09/23/2021  ?[place] and [place] Number: ? [place] ?  Facility and Address:  ?[facility],  [address], [place] ?     Provider Number: ?   ?Attending Physician Name and Address:  ?Donne Hazel, MD ? Relative Name and Phone Number:  ?Terri, Malerba   [phone]  Joshwa, Hemric Spouse [phone] ?   ?Current Level of Care: ?Hospital Recommended Level of Care: ?[place] Prior Approval Number: ?  ? ?Date Approved/Denied: ?  PASRR Number: ?[id number] A ? ?Discharge Plan: ?SNF ?  ? ?Current Diagnoses: ?Patient Active Problem List  ? Diagnosis Date Noted  ? Fever 09/23/2021  ? PLMD (periodic limb movement disorder) 04/02/2017  ? OSA on CPAP 09/28/2015  ? Cogwheel rigidity 09/28/2015  ? Nocturia more than twice per night 09/28/2015  ? Action tremor 09/28/2015  ? Circadian rhythm sleep disorder, irregular sleep wake type 10/21/2013  ? Insomnia due to medical condition 10/21/2013  ? Parkinson's disease (tremor, stiffness, slow motion, unstable posture) ([place]) 10/21/2013  ? Apraxic agraphia 10/21/2013  ? Pseudobulbar affect 10/21/2013  ? Parkinson's disease ([place]) 09/18/2012  ? Abnormality of gait 09/18/2012  ? Memory loss 09/18/2012  ? ? ?Orientation RESPIRATION BLADDER Height & Weight   ?  ?Self, Place ? Normal Continent Weight: 190 lb (86.2 kg) ?Height:  '5\' 8"'$  (172.7 cm)  ?BEHAVIORAL SYMPTOMS/MOOD NEUROLOGICAL BOWEL NUTRITION STATUS  ?    Continent Diet (Carb modified cardiac diet)  ?AMBULATORY STATUS COMMUNICATION OF NEEDS Skin   ?Limited Assist Verbally PU Stage and Appropriate Care ?PU Stage 1 Dressing:  (PRN dressing changes) ?  ?  ?    ?     ?     ? ? ?Personal Care Assistance Level of Assistance  ?Bathing, Feeding, Dressing Bathing Assistance: Limited assistance ?Feeding assistance:  Independent ?Dressing Assistance: Limited assistance ?   ? ?Functional Limitations Info  ?Sight, Speech, Hearing Sight Info: Adequate ?Hearing Info: Adequate ?Speech Info: Adequate  ? ? ?SPECIAL CARE FACTORS FREQUENCY  ?PT (By licensed PT), OT (By licensed OT)   ?  ?PT Frequency: Minimum 5x a week ?OT Frequency: Minimum 5x a week ?  ?  ?  ?   ? ? ?Contractures Contractures Info: Not present  ? ? ?Additional Factors Info  ?Code Status, Allergies, Insulin Sliding Scale Code Status Info: Full Code ?Allergies Info: NKA ?  ?Insulin Sliding Scale Info: insulin aspart (novoLOG) injection 0-9 Units 3x a day with meals ?  ?   ? ?Current Medications (09/25/2021):  This is the current hospital active medication list ?Current Facility-Administered Medications  ?Medication Dose Route Frequency Provider Last Rate Last Admin  ? acetaminophen (TYLENOL) tablet 650 mg  650 mg Oral Q6H PRN Kayleen Memos, DO   650 mg at 09/24/21 1113  ? atorvastatin (LIPITOR) tablet 40 mg  40 mg Oral Daily Irene Pap N, DO   40 mg at 09/25/21 9326  ? Chlorhexidine Gluconate Cloth 2 % PADS 6 each  6 each Topical Daily Donne Hazel, MD   6 each at 09/24/21 2132  ? cyclobenzaprine (FLEXERIL) tablet 5 mg  5 mg Oral TID PRN Donne Hazel, MD   5 mg at 09/24/21 1233  ? enoxaparin (LOVENOX) injection 40 mg  40 mg Subcutaneous QHS Irene Pap N, DO   40 mg at 09/24/21 2122  ?  insulin aspart (novoLOG) injection 0-5 Units  0-5 Units Subcutaneous QHS Kayleen Memos, DO   3 Units at 09/24/21 2122  ? insulin aspart (novoLOG) injection 0-9 Units  0-9 Units Subcutaneous TID WC Irene Pap N, DO   2 Units at 09/25/21 1115  ? melatonin tablet 3 mg  3 mg Oral QHS PRN Irene Pap N, DO      ? ondansetron (ZOFRAN) injection 4 mg  4 mg Intravenous Q6H PRN Kayleen Memos, DO      ? PARoxetine (PAXIL) tablet 10 mg  10 mg Oral Daily Donne Hazel, MD   10 mg at 09/25/21 5208  ? polyethylene glycol (MIRALAX / GLYCOLAX) packet 17 g  17 g Oral Daily PRN Irene Pap  N, DO      ? rOPINIRole (REQUIP) tablet 0.25 mg  0.25 mg Oral BID Donne Hazel, MD   0.25 mg at 09/25/21 0223  ? ? ? ?Discharge Medications: ?Please see discharge summary for a list of discharge medications. ? ?Relevant Imaging Results: ? ?Relevant Lab Results: ? ? ?Additional Information ?SSN [id number] ? ?Ross Ludwig, LCSW ? ? ? ? ?

## 2021-09-26 DIAGNOSIS — L89151 Pressure ulcer of sacral region, stage 1: Secondary | ICD-10-CM | POA: Diagnosis present

## 2021-09-26 DIAGNOSIS — F0280 Dementia in other diseases classified elsewhere without behavioral disturbance: Secondary | ICD-10-CM | POA: Diagnosis present

## 2021-09-26 DIAGNOSIS — F0390 Unspecified dementia without behavioral disturbance: Secondary | ICD-10-CM | POA: Diagnosis not present

## 2021-09-26 DIAGNOSIS — K567 Ileus, unspecified: Secondary | ICD-10-CM | POA: Diagnosis not present

## 2021-09-26 DIAGNOSIS — E1165 Type 2 diabetes mellitus with hyperglycemia: Secondary | ICD-10-CM | POA: Diagnosis present

## 2021-09-26 DIAGNOSIS — G20 Parkinson's disease: Secondary | ICD-10-CM | POA: Diagnosis not present

## 2021-09-26 DIAGNOSIS — E785 Hyperlipidemia, unspecified: Secondary | ICD-10-CM | POA: Diagnosis present

## 2021-09-26 DIAGNOSIS — E8721 Acute metabolic acidosis: Secondary | ICD-10-CM | POA: Diagnosis present

## 2021-09-26 DIAGNOSIS — R296 Repeated falls: Secondary | ICD-10-CM | POA: Diagnosis not present

## 2021-09-26 DIAGNOSIS — Z20822 Contact with and (suspected) exposure to covid-19: Secondary | ICD-10-CM | POA: Diagnosis present

## 2021-09-26 DIAGNOSIS — Z7984 Long term (current) use of oral hypoglycemic drugs: Secondary | ICD-10-CM | POA: Diagnosis not present

## 2021-09-26 DIAGNOSIS — R509 Fever, unspecified: Secondary | ICD-10-CM | POA: Diagnosis not present

## 2021-09-26 DIAGNOSIS — F32A Depression, unspecified: Secondary | ICD-10-CM | POA: Diagnosis present

## 2021-09-26 DIAGNOSIS — Z87891 Personal history of nicotine dependence: Secondary | ICD-10-CM | POA: Diagnosis not present

## 2021-09-26 DIAGNOSIS — R531 Weakness: Secondary | ICD-10-CM | POA: Diagnosis not present

## 2021-09-26 DIAGNOSIS — F419 Anxiety disorder, unspecified: Secondary | ICD-10-CM | POA: Diagnosis present

## 2021-09-26 DIAGNOSIS — Z79899 Other long term (current) drug therapy: Secondary | ICD-10-CM | POA: Diagnosis not present

## 2021-09-26 LAB — GLUCOSE, CAPILLARY
Glucose-Capillary: 153 mg/dL — ABNORMAL HIGH (ref 70–99)
Glucose-Capillary: 240 mg/dL — ABNORMAL HIGH (ref 70–99)
Glucose-Capillary: 244 mg/dL — ABNORMAL HIGH (ref 70–99)
Glucose-Capillary: 240 mg/dL — ABNORMAL HIGH (ref 70–99)

## 2021-09-26 MED ORDER — INSULIN GLARGINE-YFGN 100 UNIT/ML ~~LOC~~ SOLN
10.0000 [IU] | Freq: Every day | SUBCUTANEOUS | Status: DC
  Administered 2021-09-27 – 2021-09-29 (×3): 10 [IU] via SUBCUTANEOUS
  Filled 2021-09-26 (×4): qty 0.1

## 2021-09-26 NOTE — Care Management Obs Status (Signed)
MEDICARE OBSERVATION STATUS NOTIFICATION ? ? ?Patient Details  ?Name: Matthew [name] ?MRN: [id number] ?Date of Birth: 1934-08-05 ? ? ?Medicare Observation Status Notification Given:  Yes ? ? ? ?Sherie Don, LCSW ?09/26/2021, 2:50 PM ?

## 2021-09-26 NOTE — Progress Notes (Addendum)
Physical Therapy Treatment ?Patient Details ?Name: Matthew [name] ?MRN: [id number] ?DOB: [birth year] ?Today's Date: 09/26/2021 ? ? ?History of Present Illness Matthew [name] is a 87 y.o. male with medical history significant for dementia, Parkinson's disease, type 2 diabetes, hyperlipidemia, chronic anxiety/depression who presented from home to [facility] ED brought in by his wife due to lethargy and fatigue, associated with a subjective fever.  EMS was activated.  Blood pressure by EMS was 80/38 and CBG 432 ? ?  ?PT Comments  ? ? General Comments: AxO x 3 very pleasant Retired from [employer] currently lives with spouse but now requires more care due to gait instability and general weakness. Assisted with amb to bathroom was difficult.  General transfer comment: pt required Mod Assist from recliner with 25% VC's to push up from arm rest but then needed Max Assist to rise from lower toilet level due to general weakness and impaired balance. General Gait Details: very limited amb distance to and from bathrrom required Mod Assist due to impaired balance, poor forward flex posture, "bad" Valgus RIGHT Knee deformity and shuffled festinating steps.  Pt also present with delayed corrective reaction balance response.  HIGH FALL RISK. General bed mobility comments: assisted back to bed Mod assist B LE and to position to comfort. ?Pt will need ST Rehab at SNF to address his mobility decline and balance impairment prior to safely returning home with Spouse.   ?Recommendations for follow up therapy are one component of a multi-disciplinary discharge planning process, led by the attending physician.  Recommendations may be updated based on patient status, additional functional criteria and insurance authorization. ? ?Follow Up Recommendations ? Skilled nursing-short term rehab (<3 hours/day) ?  ?  ?Assistance Recommended at Discharge    ?Patient can return home with the following A lot of help with walking and/or transfers;A lot of help  with bathing/dressing/bathroom;Assistance with cooking/housework;Assist for transportation ?  ?Equipment Recommendations ? None recommended by PT  ?  ?Recommendations for Other Services   ? ? ?  ?Precautions / Restrictions Precautions ?Precautions: Fall ?Precaution Comments: several falls per patient ?Restrictions ?Weight Bearing Restrictions: No  ?  ? ?Mobility ? Bed Mobility ?Overal bed mobility: Needs Assistance ?Bed Mobility: Sit to Supine ?  ?  ?  ?Sit to supine: Mod assist ?  ?General bed mobility comments: assisted back to bed Mod assist B LE and to position to comfort. ?  ? ?Transfers ?Overall transfer level: Needs assistance ?Equipment used: Rolling walker (2 wheels) ?Transfers: Sit to/from Stand ?Sit to Stand: Mod assist, Max assist ?  ?  ?  ?  ?  ?General transfer comment: pt required Mod Assist from recliner with 25% VC's to push up from arm rest but then needed Max Assist to rise from lower toilet level due to general weakness and impaired balance. ?  ? ?Ambulation/Gait ?Ambulation/Gait assistance: Mod assist ?Gait Distance (Feet): 22 Feet (11 feet x 2 to and from bathroom) ?Assistive device: Rolling walker (2 wheels) ?Gait Pattern/deviations: Decreased step length - right, Decreased step length - left, Knee flexed in stance - right, Festinating ?Gait velocity: decreased ?  ?  ?General Gait Details: very limited amb distance to and from bathrrom required Mod Assist due to impaired balance, poor forward flex posture, "bad" Valgus Knee deformity and shuffled festinating steps.  Pt also present with delayed corrective reaction balance response.  HIGH FALL RISK. ? ? ?Stairs ?  ?  ?  ?  ?  ? ? ?Wheelchair Mobility ?  ? ?  Modified Rankin (Stroke Patients Only) ?  ? ? ?  ?Balance   ?  ?  ?  ?  ?  ?  ?  ?  ?  ?  ?  ?  ?  ?  ?  ?  ?  ?  ?  ? ?  ?Cognition Arousal/Alertness: Awake/alert ?Behavior During Therapy: [facility] for tasks assessed/performed ?Overall Cognitive Status: Within Functional Limits for tasks  assessed ?  ?  ?  ?  ?  ?  ?  ?  ?  ?  ?  ?  ?  ?  ?  ?  ?General Comments: AxO x 3 very pleasant Retired from [employer] currently lives with spouse but now requires more care due to gait instability and general weakness. ?  ?  ? ?  ?Exercises   ? ?  ?General Comments   ?  ?  ? ?Pertinent Vitals/Pain Pain Assessment ?Pain Assessment: Faces ?Faces Pain Scale: Hurts little more ?Pain Location: R knee and general stiffness ?Pain Descriptors / Indicators: Aching, Discomfort, Grimacing ?Pain Intervention(s): Monitored during session, Repositioned  ? ? ?Home Living   ?  ?  ?  ?  ?  ?  ?  ?  ?  ?   ?  ?Prior Function    ?  ?  ?   ? ?PT Goals (current goals can now be found in the care plan section) Progress towards PT goals: Progressing toward goals ? ?  ?Frequency ? ? ? Min 2X/week ? ? ? ?  ?PT Plan Current plan remains appropriate  ? ? ?Co-evaluation   ?  ?  ?  ?  ? ?  ?AM-PAC PT "6 Clicks" Mobility   ?Outcome Measure ? Help needed turning from your back to your side while in a flat bed without using bedrails?: A Lot ?Help needed moving from lying on your back to sitting on the side of a flat bed without using bedrails?: A Lot ?Help needed moving to and from a bed to a chair (including a wheelchair)?: A Lot ?Help needed standing up from a chair using your arms (e.g., wheelchair or bedside chair)?: A Lot ?Help needed to walk in hospital room?: Total ?Help needed climbing 3-5 steps with a railing? : Total ?6 Click Score: 10 ? ?  ?End of Session Equipment Utilized During Treatment: Gait belt ?Activity Tolerance: Patient limited by fatigue ?Patient left: in bed;with bed alarm set;with call bell/phone within reach ?Nurse Communication: Mobility status ?PT Visit Diagnosis: Unsteadiness on feet (R26.81);Repeated falls (R29.6);Difficulty in walking, not elsewhere classified (R26.2);Other symptoms and signs involving the nervous system (R29.898) ?  ? ? ?Time: 6063-0160 ?PT Time Calculation (min) (ACUTE ONLY): 28 min ? ?Charges:   $Gait Training: 8-22 mins ?$Therapeutic Activity: 8-22 mins          ?          ? ?Rica Koyanagi  PTA ?Acute  Rehabilitation Services ?Pager      [phone] ?Office      [phone] ? ? ?

## 2021-09-26 NOTE — Plan of Care (Signed)

## 2021-09-26 NOTE — Progress Notes (Signed)
Inpatient Diabetes Program Recommendations ? ?AACE/ADA: New Consensus Statement on Inpatient Glycemic Control (2015) ? ?Target Ranges:  Prepandial:   less than 140 mg/dL ?     Peak postprandial:   less than 180 mg/dL (1-2 hours) ?     Critically ill patients:  140 - 180 mg/dL  ? ?Lab Results  ?Component Value Date  ? GLUCAP 240 (H) 09/26/2021  ? HGBA1C 11.0 (H) 09/24/2021  ? ? ?Review of Glycemic Control ? Latest Reference Range & Units 09/25/21 07:15 09/25/21 11:12 09/25/21 15:50 09/25/21 20:51 09/26/21 07:28 09/26/21 11:56  ?Glucose-Capillary 70 - 99 mg/dL 185 (H) 245 (H) 203 (H) 216 (H) 153 (H) 240 (H)  ? ?Diabetes history: DM 2 ?Outpatient Diabetes medications:  ?Jardiance 25 mg daily ?Jentadueto 2.5-500 mg bid ?Tresiba 12 units daily ?Current orders for Inpatient glycemic control:  ?Novolog 0-9 units tid with meals and HS ?Inpatient Diabetes Program Recommendations:   ?Please add Semglee 10 units daily.  ? ?Thanks,  ?Adah Perl, RN, BC-ADM ?Inpatient Diabetes Coordinator ?Pager [phone]  (8a-5p) ? ? ? ?

## 2021-09-26 NOTE — TOC Progression Note (Signed)
Transition of Care (TOC) - Progression Note  ? ?Patient Details  ?Name: Matthew [name] ?MRN: [id number] ?Date of Birth: 1935-05-07 ? ?Transition of Care (TOC) CM/SW Contact  ?Sherie Don, LCSW ?Phone Number: ?09/26/2021, 2:50 PM ? ?Clinical Narrative: Patient's family now wants the patient to go to SNF rather than [facility] and is requesting a SNF in the [place] area even though the patient has local bed offers. CSW explained to daughter-in-law, Fontaine Hehl, that patient is in observation and will be billed differently as he is not inpatient. Family will also be required to pay for ambulance transport up front if a [place] facility is found. CSW also explained that if family cannot find a facility that will accept the patient, the family will need to consider local bed offers. CSW contacted the following facilities: ? ?[facility] [phone]): spoke to [place] in admissions and there are no rehab beds available ?[facility] [phone]): left voicemail for Maudie Mercury in admissions ?[facility] [phone]): spoke to [facility] in admissions and there are no available rehab beds, facility is also out-of-network with [employer] ?[facility] [phone]): cannot accept the patient as he is not a resident of the community ?[facility] [phone]): left voicemail for [place] in admissions ?[facility] [phone]): spoke with Katharine Look who cannot confirm if the facility has any bed availability ?[facility] [phone]): spoke with C[facility] in admissions and there is a 7 person waitlist, but she will review the referral which was emailed to [email or web address] ? ?Readmission Risk Interventions ?   ? View : No data to display.  ?  ?  ?  ? ?

## 2021-09-26 NOTE — Progress Notes (Addendum)
[facility]) Hospital Liaison Note  Notified by TOC manager of patient/family request for [facility] palliative services at home after discharge.   [facility] hospital liaison will follow patient for discharge disposition.   Please call with any hospice or outpatient palliative care related questions.   Thank you for the opportunity to participate in this patient's care.   Shanita Wicker, LCSW [facility] Hospital Liaison [phone]  

## 2021-09-26 NOTE — Progress Notes (Signed)
?Progress Note ? ? ?Patient: Matthew [name] WFU:[id number] DOB: 09-Apr-1935 DOA: 09/23/2021     0 ?DOS: the patient was seen and examined on 09/26/2021 ?  ?Brief hospital course: ?87 y.o. male with medical history significant for dementia, Parkinson's disease, type 2 diabetes, hyperlipidemia, chronic anxiety/depression who presented from home to [facility] ED brought in by his wife due to lethargy and fatigue, associated with a subjective fever.  EMS was activated.  Blood pressure by EMS was 80/38 and CBG 432.  Patient was brought into the ED for further evaluation and management of his symptomatology.  In the ED, febrile with Tmax 100.7.  UA negative for pyuria.  No leukocytosis.  COVID-19, influenza A, and influenza B PCR are negative.  Chest x-ray nonacute. ? ?Assessment and Plan: ?No notes have been filed under this hospital service. ?Service: Hospitalist ? ?Fever, unclear cause ?Work-up with no evidence of active infective process. ?In the ED Tmax 100.7 rectally. Of note, pt reports over one week hx of constipation. Moderate sized stool today. Question validity of rectal temp at presentation ?UA, chest x-ray nonrevealing. ?No leukocytosis ?Pt has since remained afebrile off abx ?Blood and urine cx pending, thus far no growth ?  ?Lactic acidosis, unclear cause ?Lactic acid on presentation 3.0, downtrending to 2.0 ?Continue IV fluid hydration ?Lactate normalized ?  ?Acute metabolic acidosis of unclear cause ?Given IV fluid hydration ?Reorient as needed ?Fall precautions ?PT/OT recs for SNF noted.TOC following ?  ?Non anion gap metabolic acidosis ?Serum bicarb 20, anion gap 11 ?IV fluid hydration ?Normalized ?  ?Type 2 diabetes with hyperglycemia ?Obtain hemoglobin A1c ?Start insulin sliding scale ?  ?Parkinson's disease/dementia ?Not on meds per [facility] ?Chart reviewed. Pt has been lost to follow up with Neuorology, was last seen in 2020 where pt was on Requip at that time. Pt had since been lost to follow up, again, requip not  on current MAR ?Discussed case with Neurology. Recommendation to cont on 0.'25mg'$  requip while in room ?Pt scheduled to see Neurology next month, would defer to outpt Neuro to uptitrate dose ?  ?Generalized weakness ?PT OT with recs for SNF ?TOC consulted, family has since desired [facility] instead ?Fall precautions ?  ?Ileus ?-Nausea recently with abd xray revealing air-filled prominent loops of colon with findings suggesting colonic ileus ?-Pt continues to pass flatus ?-Cont scheduled bowel regimen ? ?  ? ?Subjective: Does report feeling weaker than normal ? ?Physical Exam: ?Vitals:  ? 09/25/21 1249 09/25/21 2049 09/26/21 0457 09/26/21 0841  ?BP: 127/69 116/70 133/81 131/70  ?Pulse: 79 70 71 91  ?Resp: '15 17 16 15  '$ ?Temp: 98.2 ?F (36.8 ?C) 98.7 ?F (37.1 ?C) 97.9 ?F (36.6 ?C) 98.6 ?F (37 ?C)  ?TempSrc: Oral  Oral Oral  ?SpO2: 93% 96% 98% 94%  ?Weight:      ?Height:      ? ?General exam: Awake, laying in bed, in nad ?Respiratory system: Normal respiratory effort, no wheezing ?Cardiovascular system: regular rate, s1, s2 ?Gastrointestinal system: Soft, nondistended, positive BS ?Central nervous system: CN2-12 grossly intact, strength intact ?Extremities: Perfused, no clubbing ?Skin: Normal skin turgor, no notable skin lesions seen ?Psychiatry: Mood normal // no visual hallucinations  ? ?Data Reviewed: ? ?There are no new results to review at this time. ? ?Family Communication: Pt in room, family not at bedside ? ?Disposition: ?Status is: Observation ?The patient will require care spanning > 2 midnights and should be moved to inpatient because: Severity of illness ? Planned Discharge  Destination: Skilled nursing facility ? ? ? ?Author: ?Marylu Lund, MD ?09/26/2021 1:43 PM ? ?For on call review [email or web address]  ?

## 2021-09-27 DIAGNOSIS — R509 Fever, unspecified: Secondary | ICD-10-CM | POA: Diagnosis not present

## 2021-09-27 LAB — COMPREHENSIVE METABOLIC PANEL
ALT: 16 U/L (ref 0–44)
AST: 25 U/L (ref 15–41)
Albumin: 2.9 g/dL — ABNORMAL LOW (ref 3.5–5.0)
Alkaline Phosphatase: 59 U/L (ref 38–126)
Anion gap: 7 (ref 5–15)
BUN: 17 mg/dL (ref 8–23)
CO2: 25 mmol/L (ref 22–32)
Calcium: 8 mg/dL — ABNORMAL LOW (ref 8.9–10.3)
Chloride: 106 mmol/L (ref 98–111)
Creatinine, Ser: 0.77 mg/dL (ref 0.61–1.24)
GFR, Estimated: >60 mL/min (ref >60)
Glucose, Bld: 178 mg/dL — ABNORMAL HIGH (ref 70–99)
Potassium: 3.7 mmol/L (ref 3.5–5.1)
Sodium: 138 mmol/L (ref 135–145)
Total Bilirubin: 0.9 mg/dL (ref 0.3–1.2)
Total Protein: 5.6 g/dL — ABNORMAL LOW (ref 6.5–8.1)

## 2021-09-27 LAB — GLUCOSE, CAPILLARY
Glucose-Capillary: 237 mg/dL — ABNORMAL HIGH (ref 70–99)
Glucose-Capillary: 212 mg/dL — ABNORMAL HIGH (ref 70–99)
Glucose-Capillary: 219 mg/dL — ABNORMAL HIGH (ref 70–99)
Glucose-Capillary: 185 mg/dL — ABNORMAL HIGH (ref 70–99)

## 2021-09-27 NOTE — Progress Notes (Signed)
Physical Therapy Treatment ?Patient Details ?Name: XAN INGRAHAM ?MRN: [id number] ?DOB: 1934-12-19 ?Today's Date: 09/27/2021 ? ? ?History of Present Illness RAYDEL HOSICK is a 87 y.o. male with medical history significant for dementia, Parkinson's disease, type 2 diabetes, hyperlipidemia, chronic anxiety/depression who presented from home to [facility] ED brought in by his wife due to lethargy and fatigue, associated with a subjective fever.  EMS was activated.  Blood pressure by EMS was 80/38 and CBG 432 ? ?  ?PT Comments  ? ? Pt is AxO x 3 pleasant but does require repeat instructions.  He did state, "I am leaving tomorrow" and named the facility he is going to. Assisted OOB to amb to the bathroom was very difficult.  General bed mobility comments: required Max Assist for upper body and Max Assist to complete scooting buttocks to EOB. General transfer comment: required increased asisst this session due to Parkinson's like motor initiation and control.  Pt is rigid and delayed.  "I'm stuck" stated pt.  Great difficulty self moving. General Gait Details: Required increased assist this session due to Parkinson's like freezing, festination, rigidty and stifness.  Limited amb distance to and from bathroom  due to weakness and fatigue.  Poor fprward flexed posture and shuffled short steps.  Max  VC's to "pick up your feet" and incomplete turns on both toilet and recliner as pt sits too early.  "I'm getting worse", state pt.  HIGH FALL RISK. ?Pt was indep amb at home but had multiple falls.  Spouse was assisting with ADL's.  Pt will need ST Rehab at SNF prior to safely returning home to address his decline in mobility. ?  ?Recommendations for follow up therapy are one component of a multi-disciplinary discharge planning process, led by the attending physician.  Recommendations may be updated based on patient status, additional functional criteria and insurance authorization. ? ?Follow Up Recommendations ? Skilled  nursing-short term rehab (<3 hours/day) ?  ?  ?Assistance Recommended at Discharge    ?Patient can return home with the following A lot of help with walking and/or transfers;A lot of help with bathing/dressing/bathroom;Assistance with cooking/housework;Assist for transportation ?  ?Equipment Recommendations ? None recommended by PT  ?  ?Recommendations for Other Services   ? ? ?  ?Precautions / Restrictions Precautions ?Precaution Comments: several falls per patient, Hx Parkinson's ?Restrictions ?Weight Bearing Restrictions: No  ?  ? ?Mobility ? Bed Mobility ?Overal bed mobility: Needs Assistance ?Bed Mobility: Supine to Sit ?  ?  ?Supine to sit: Max assist, +2 for safety/equipment, HOB elevated ?  ?  ?General bed mobility comments: required Max Assist for upper body and Max Assist to complete scooting buttocks to EOB. ?  ? ?Transfers ?Overall transfer level: Needs assistance ?Equipment used: Rolling walker (2 wheels) ?Transfers: Sit to/from Stand ?Sit to Stand: Max assist ?  ?  ?  ?  ?  ?General transfer comment: required increased asisst this session due to Parkinson's like motor initiation and control.  Pt is rigid and delayed.  "I'm stuck" stated pt.  Great difficulty self moving. ?  ? ?Ambulation/Gait ?Ambulation/Gait assistance: Max assist ?Gait Distance (Feet): 22 Feet ?Assistive device: Rolling walker (2 wheels) ?Gait Pattern/deviations: Decreased step length - right, Decreased step length - left, Knee flexed in stance - right, Festinating ?Gait velocity: decreased ?  ?  ?General Gait Details: Required increased assist this session due to Parkinson's like freezing, festination, rigidty and stifness.  Limited amb distance to abnd from bathroom only due to  weakness and fatigue.  Poor fprward flexed posture and shuffled short steps.  HIGH FALL RISK. ? ? ?Stairs ?  ?  ?  ?  ?  ? ? ?Wheelchair Mobility ?  ? ?Modified Rankin (Stroke Patients Only) ?  ? ? ?  ?Balance   ?  ?  ?  ?  ?  ?  ?  ?  ?  ?  ?  ?  ?  ?  ?  ?   ?  ?  ?  ? ?  ?Cognition Arousal/Alertness: Awake/alert ?Behavior During Therapy: [facility] for tasks assessed/performed ?Overall Cognitive Status: Within Functional Limits for tasks assessed ?  ?  ?  ?  ?  ?  ?  ?  ?  ?  ?  ?  ?  ?  ?  ?  ?General Comments: AxO x 3 very pleasant Retired from [employer] currently lives with spouse but now requires more care due to gait instability and general weakness. ?  ?  ? ?  ?Exercises   ? ?  ?General Comments   ?  ?  ? ?Pertinent Vitals/Pain Pain Assessment ?Pain Assessment: Faces ?Faces Pain Scale: Hurts a little bit ?Pain Location: R knee and general stiffness ?Pain Descriptors / Indicators: Aching, Discomfort, Grimacing ?Pain Intervention(s): Monitored during session  ? ? ?Home Living   ?  ?  ?  ?  ?  ?  ?  ?  ?  ?   ?  ?Prior Function    ?  ?  ?   ? ?PT Goals (current goals can now be found in the care plan section) Progress towards PT goals: Progressing toward goals ? ?  ?Frequency ? ? ? Min 2X/week ? ? ? ?  ?PT Plan Current plan remains appropriate  ? ? ?Co-evaluation   ?  ?  ?  ?  ? ?  ?AM-PAC PT "6 Clicks" Mobility   ?Outcome Measure ? Help needed turning from your back to your side while in a flat bed without using bedrails?: A Lot ?Help needed moving from lying on your back to sitting on the side of a flat bed without using bedrails?: A Lot ?Help needed moving to and from a bed to a chair (including a wheelchair)?: A Lot ?Help needed standing up from a chair using your arms (e.g., wheelchair or bedside chair)?: A Lot ?Help needed to walk in hospital room?: Total ?Help needed climbing 3-5 steps with a railing? : Total ?6 Click Score: 10 ? ?  ?End of Session Equipment Utilized During Treatment: Gait belt ?Activity Tolerance: Patient limited by fatigue;Other (comment) ?Patient left: in chair;with call bell/phone within reach ?Nurse Communication: Mobility status ?PT Visit Diagnosis: Unsteadiness on feet (R26.81);Repeated falls (R29.6);Difficulty in walking, not elsewhere  classified (R26.2);Other symptoms and signs involving the nervous system (R29.898) ?  ? ? ?Time: 9381-0175 ?PT Time Calculation (min) (ACUTE ONLY): 28 min ? ?Charges:  $Gait Training: 8-22 mins ?$Therapeutic Activity: 8-22 mins          ?          ? ?{Tiran Sauseda  PTA ?Acute  Rehabilitation Services ?Pager      [phone] ?Office      [phone] ? ?

## 2021-09-27 NOTE — TOC Progression Note (Signed)
Transition of Care (TOC) - Progression Note  ? ? ?Patient Details  ?Name: BRYAR DAHMS ?MRN: [id number] ?Date of Birth: 1934/05/31 ? ?Transition of Care (TOC) CM/SW Contact  ?Abbey Veith, LCSW ?Phone Number: ?09/27/2021, 3:29 PM ? ?Clinical Narrative:    ?Have alerted pt's daughter-in-law, Butch Penny, that we have been unsuccessful at securing a SNF bed at any of the facilities in the [place] area that she preferred and will need to proceed with local bed offers - she and pt have accepted bed at [facility] who can admit pt tomorrow.  Facility to begin insurance authorization. ? ? ?  ?Barriers to Discharge: Insurance Authorization ? ?Expected Discharge Plan and Services ?  ?  ?  ?  ?  ?                ?  ?  ?  ?  ?  ?  ?  ?  ?  ?  ? ? ?Social Determinants of Health (SDOH) Interventions ?  ? ?Readmission Risk Interventions ?   ? View : No data to display.  ?  ?  ?  ? ? ?

## 2021-09-27 NOTE — Progress Notes (Signed)
?  Progress Note ? ? ?Patient: Matthew [name] WRU:[id number] DOB: 1935-02-10 DOA: 09/23/2021     1 ?DOS: the patient was seen and examined on 09/27/2021 ?  ?Brief hospital course: ?87 y.o. male with medical history significant for dementia, Parkinson's disease, type 2 diabetes, hyperlipidemia, chronic anxiety/depression who presented from home to [facility] ED brought in by his wife due to lethargy and fatigue, associated with a subjective fever.  EMS was activated.  Blood pressure by EMS was 80/38 and CBG 432.  Patient was brought into the ED for further evaluation and management of his symptomatology.  In the ED, febrile with Tmax 100.7.  UA negative for pyuria.  No leukocytosis.  COVID-19, influenza A, and influenza B PCR are negative.  Chest x-ray nonacute. ? ?Assessment and Plan: ?No notes have been filed under this hospital service. ?Service: Hospitalist ? ?Fever, unclear cause ?Work-up with no evidence of active infective process. ?In the ED Tmax 100.7 rectally. Of note, pt reports over one week hx of constipation. Moderate sized stool today. Question validity of rectal temp at presentation ?UA, chest x-ray nonrevealing. ?No leukocytosis ?Pt has since remained afebrile off abx ?Blood and urine cx have returned neg ?  ?Lactic acidosis, unclear cause ?Lactic acid on presentation 3.0, downtrending to 2.0 ?Given IVF hydration ?Lactate normalized ?  ?Acute metabolic acidosis of unclear cause ?Given IV fluid hydration ?Reorient as needed ?Fall precautions ?PT/OT recs for SNF noted.TOC following ?  ?Non anion gap metabolic acidosis ?Serum bicarb 20, anion gap 11 ?IV fluid hydration ?Normalized ?  ?Type 2 diabetes with hyperglycemia ?Obtain hemoglobin A1c ?Continue SSI while in hospital ?  ?Parkinson's disease/dementia ?Not on meds per [facility] ?Chart reviewed. Pt has been lost to follow up with Neuorology, was last seen in 2020 where pt was on Requip at that time. Pt had since been lost to follow up, again, requip not on current  MAR ?Discussed case with Neurology. Recommendation to cont on 0.'25mg'$  requip while in room ?Pt scheduled to see Neurology next month, would defer to outpt Neuro to uptitrate dose ?  ?Generalized weakness ?PT OT with recs for SNF, TOC following ?Fall precautions ?  ?Ileus, resolved ?-Nausea recently with abd xray revealing air-filled prominent loops of colon with findings suggesting colonic ileus ?-Resolved after moderate BM ?-Cont scheduled bowel regimen ? ?  ? ?Subjective: Denies abd pain or nausea this AM ? ?Physical Exam: ?Vitals:  ? 09/26/21 1349 09/26/21 2128 09/27/21 0524 09/27/21 1310  ?BP: 128/68 115/65 126/72 (!) 126/57  ?Pulse: 88 69 (!) 59 70  ?Resp: '17 17 16 15  '$ ?Temp: 98.6 ?F (37 ?C) 97.6 ?F (36.4 ?C) 98.1 ?F (36.7 ?C) 97.8 ?F (36.6 ?C)  ?TempSrc: Oral Oral Oral Oral  ?SpO2: 96% 94% 96% 94%  ?Weight:      ?Height:      ? ?General exam: Conversant, in no acute distress ?Respiratory system: normal chest rise, clear, no audible wheezing ?Cardiovascular system: regular rhythm, s1-s2 ?Gastrointestinal system: Nondistended, nontender, pos BS ?Central nervous system: No seizures, no tremors ?Extremities: No cyanosis, no joint deformities ?Skin: No rashes, no pallor ?Psychiatry: Affect normal // no auditory hallucinations  ? ?Data Reviewed: ? ?Labs reviewed: Cr 0.77, Alb 2.9 ? ?Family Communication: Pt in room, family not at bedside ? ?Disposition: ?Status is: Inpatient ?Cont inpatient stay because: Severity of illness ? Planned Discharge Destination: Skilled nursing facility ? ? ? ?Author: ?Marylu Lund, MD ?09/27/2021 3:19 PM ? ?For on call review [email or web address]  ?

## 2021-09-28 ENCOUNTER — Inpatient Hospital Stay (HOSPITAL_COMMUNITY): Payer: Medicare HMO

## 2021-09-28 DIAGNOSIS — R509 Fever, unspecified: Secondary | ICD-10-CM | POA: Diagnosis not present

## 2021-09-28 DIAGNOSIS — G20 Parkinson's disease: Secondary | ICD-10-CM | POA: Diagnosis not present

## 2021-09-28 LAB — GLUCOSE, CAPILLARY
Glucose-Capillary: 169 mg/dL — ABNORMAL HIGH (ref 70–99)
Glucose-Capillary: 307 mg/dL — ABNORMAL HIGH (ref 70–99)
Glucose-Capillary: 226 mg/dL — ABNORMAL HIGH (ref 70–99)
Glucose-Capillary: 238 mg/dL — ABNORMAL HIGH (ref 70–99)

## 2021-09-28 LAB — CBC
HCT: 43.2 % (ref 39.0–52.0)
Hemoglobin: 14.8 g/dL (ref 13.0–17.0)
MCH: 32 pg (ref 26.0–34.0)
MCHC: 34.3 g/dL (ref 30.0–36.0)
MCV: 93.3 fL (ref 80.0–100.0)
Platelets: 167 10*3/uL (ref 150–400)
RBC: 4.63 MIL/uL (ref 4.22–5.81)
RDW: 13.1 % (ref 11.5–15.5)
WBC: 4.2 10*3/uL (ref 4.0–10.5)
nRBC: 0 % (ref 0.0–0.2)

## 2021-09-28 LAB — COMPREHENSIVE METABOLIC PANEL
ALT: 42 U/L (ref 0–44)
AST: 59 U/L — ABNORMAL HIGH (ref 15–41)
Albumin: 2.9 g/dL — ABNORMAL LOW (ref 3.5–5.0)
Alkaline Phosphatase: 65 U/L (ref 38–126)
Anion gap: 8 (ref 5–15)
BUN: 16 mg/dL (ref 8–23)
CO2: 27 mmol/L (ref 22–32)
Calcium: 8.4 mg/dL — ABNORMAL LOW (ref 8.9–10.3)
Chloride: 104 mmol/L (ref 98–111)
Creatinine, Ser: 0.87 mg/dL (ref 0.61–1.24)
GFR, Estimated: >60 mL/min (ref >60)
Glucose, Bld: 193 mg/dL — ABNORMAL HIGH (ref 70–99)
Potassium: 4 mmol/L (ref 3.5–5.1)
Sodium: 139 mmol/L (ref 135–145)
Total Bilirubin: 0.7 mg/dL (ref 0.3–1.2)
Total Protein: 5.8 g/dL — ABNORMAL LOW (ref 6.5–8.1)

## 2021-09-28 LAB — CULTURE, BLOOD (ROUTINE X 2): Culture: NO GROWTH

## 2021-09-28 MED ORDER — COVID-19MRNA BIVAL VACC PFIZER 30 MCG/0.3ML IM SUSP
0.3000 mL | Freq: Once | INTRAMUSCULAR | Status: DC
  Filled 2021-09-28: qty 0.3

## 2021-09-28 MED ORDER — CARBIDOPA-LEVODOPA 25-100 MG PO TABS
0.5000 | ORAL_TABLET | Freq: Two times a day (BID) | ORAL | Status: DC
  Administered 2021-09-29 (×2): 0.5 via ORAL
  Filled 2021-09-28 (×3): qty 0.5

## 2021-09-28 NOTE — TOC Progression Note (Signed)
Transition of Care (TOC) - Progression Note  ? ?Patient Details  ?Name: Matthew [name] ?MRN: [id number] ?Date of Birth: 09/06/1934 ? ?Transition of Care (TOC) CM/SW Contact  ?Sherie Don, LCSW ?Phone Number: ?09/28/2021, 3:33 PM ? ?Clinical Narrative: TOC notified that Iris with [facility] declined patient for rehab. CSW followed up with Lorenza Chick in admissions and confirmed insurance authorization is still pending. CSW updated DIL. ? ?Expected Discharge Plan: [place] ?Barriers to Discharge: Insurance Authorization ? ?Expected Discharge Plan and Services ?Expected Discharge Plan: [place] ?In-house Referral: Clinical Social Work ?Post Acute Care Choice: [place] ?Living arrangements for the past 2 months: [place]           ?DME Arranged: N/A ?DME Agency: NA ? ?Readmission Risk Interventions ?   ? View : No data to display.  ?  ?  ?  ? ?

## 2021-09-28 NOTE — Progress Notes (Signed)
Occupational Therapy Treatment ?Patient Details ?Name: Matthew [name] ?MRN: [id number] ?DOB: [birth year] ?Today's Date: 09/28/2021 ? ? ?History of present illness MOIZ RYANT is a 87 y.o. male with medical history significant for dementia, Parkinson's disease, type 2 diabetes, hyperlipidemia, chronic anxiety/depression who presented from home to [facility] ED brought in by his wife due to lethargy and fatigue, associated with a subjective fever.  EMS was activated.  Blood pressure by EMS was 80/38 and CBG 432 ?  ?OT comments ? Treatment focused on standing ADL task. Patient overall min assist for mobility - exhibits posterior lean in standing. Patient stood at sink for grooming.  ? ?Recommendations for follow up therapy are one component of a multi-disciplinary discharge planning process, led by the attending physician.  Recommendations may be updated based on patient status, additional functional criteria and insurance authorization. ?   ?Follow Up Recommendations ? Skilled nursing-short term rehab (<3 hours/day)  ?  ?Assistance Recommended at Discharge Frequent or constant Supervision/Assistance  ?Patient can return home with the following ? A little help with walking and/or transfers;A lot of help with bathing/dressing/bathroom;Assistance with cooking/housework;Direct supervision/assist for financial management;Direct supervision/assist for medications management;Help with stairs or ramp for entrance ?  ?Equipment Recommendations ? None recommended by OT  ?  ?Recommendations for Other Services   ? ?  ?Precautions / Restrictions Precautions ?Precautions: Fall ?Precaution Comments: several falls per patient, Hx Parkinson's ?Restrictions ?Weight Bearing Restrictions: No  ? ? ?  ? ?   ?Balance Overall balance assessment: Needs assistance ?Sitting-balance support: No upper extremity supported, Feet supported ?Sitting balance-Leahy Scale: Fair ?  ?  ?Standing balance support: During functional activity ?Standing  balance-Leahy Scale: Poor ?Standing balance comment: postwerior lean ?  ?  ?  ?  ?  ?  ?  ?  ?  ?  ?  ?   ? ?ADL either performed or assessed with clinical judgement  ? ?ADL Overall ADL's : Needs assistance/impaired ?  ?  ?Grooming: Standing;Wash/dry hands;Wash/dry face ?Grooming Details (indicate cue type and reason): stood at sink , min assist to reduce posterior lean at sink. Patient not propping on UEs ?  ?  ?  ?  ?  ?  ?  ?  ?  ?  ?  ?  ?  ?  ?Functional mobility during ADLs: Rolling walker (2 wheels);Minimal assistance ?General ADL Comments: Patietn supine in bed. Patient min assist to transfer to edge of bed using therapuist hand to pull up on. MI nassist to power up from bed with walker and min assist to take steps at sink. Min assist for balance at sink to wash hands and face. Min assist to take steps back to recliner. Patient assisted with food set up. ?  ? ?Extremity/Trunk Assessment   ?  ?  ?  ?  ?  ? ?Vision Patient Visual Report: No change from baseline ?  ?  ?Perception   ?  ?Praxis   ?  ? ?Cognition Arousal/Alertness: Awake/alert ?Behavior During Therapy: [facility] for tasks assessed/performed ?Overall Cognitive Status: Within Functional Limits for tasks assessed ?  ?  ?  ?  ?  ?  ?  ?  ?  ?  ?  ?  ?  ?  ?  ?  ?  ?  ?  ?   ?   ?   ?   ? ? ?Pertinent Vitals/ Pain       Pain Assessment ?Pain Assessment: No/denies pain ? ?Home Living   ?  ?  ?  ?  ?  ?  ?  ?  ?  ?  ?  ?  ?  ?  ?  ?  ?  ?  ? ?  ?  Prior Functioning/Environment    ?  ?  ?  ?   ? ?Frequency ? Min 2X/week  ? ? ? ? ?  ?Progress Toward Goals ? ?OT Goals(current goals can now be found in the care plan section) ? Progress towards OT goals: Progressing toward goals ? ?Acute Rehab OT Goals ?Patient Stated Goal: walk better ?OT Goal Formulation: With patient ?Time For Goal Achievement: 10/08/21 ?Potential to Achieve Goals: Good  ?Plan Discharge plan remains appropriate   ? ?Co-evaluation ? ? ?   ?  ?  ?OT goals addressed during session: ADL's and  self-care ?  ? ?  ?AM-PAC OT "6 Clicks" Daily Activity     ?Outcome Measure ? ? Help from another person eating meals?: A Little ?Help from another person taking care of personal grooming?: A Little ?Help from another person toileting, which includes using toliet, bedpan, or urinal?: A Lot ?Help from another person bathing (including washing, rinsing, drying)?: A Lot ?Help from another person to put on and taking off regular upper body clothing?: A Little ?Help from another person to put on and taking off regular lower body clothing?: A Lot ?6 Click Score: 15 ? ?  ?End of Session Equipment Utilized During Treatment: Rolling walker (2 wheels);Gait belt ? ?OT Visit Diagnosis: Unsteadiness on feet (R26.81);Other abnormalities of gait and mobility (R26.89);Other symptoms and signs involving cognitive function ?  ?Activity Tolerance Patient tolerated treatment well ?  ?Patient Left in chair;with call bell/phone within reach ?  ?Nurse Communication Mobility status ?  ? ?   ? ?Time: 9381-0175 ?OT Time Calculation (min): 11 min ? ?Charges: OT General Charges ?$OT Visit: 1 Visit ?OT Treatments ?$Self Care/Home Management : 8-22 mins ? ?Deontre Allsup, OTR/L ?Acute Care Rehab Services  ?Office [phone] ?Pager: [phone]  ? ?Tasharra Nodine L Anaise Sterbenz ?09/28/2021, 8:54 AM ?

## 2021-09-28 NOTE — Consult Note (Signed)
NEUROLOGY CONSULTATION NOTE  ? ?Date of service: Sep 28, 2021 ?Patient Name: Matthew [name] ?MRN:  [id number] ?DOB:  10/19/1934 ?Reason for consult: "Dementia" ?_ _ _   _ __   _ __ _ _  __ __   _ __   __ _ ? ?History of Present Illness  ?Matthew [name] is a 87 y.o. male with PMH significant for  has a past medical history of Depression, Diabetes mellitus ([place]), Hyperlipidemia, Memory loss, and Parkinson's disease ([place]). who presents with subjective fever and generalized fatigue. Neurology was consulted for further evaluation and management of Parkinson's disease.  ? ?Patient was last seen by an APP at [facility] on 12/2018. At that time patient was taking a stable dose of ropinirole 0.5 qHS and started on carbidopa/levodopa. However, carbidopa/levodopa was subsequently d/c'd due to patient intolerance secondary to diarrhea. The patients Parkinson's symptoms were stable with baseline upper extremity tremors (left greater than right), masked facies requiring a walker to ambulate, and cognitive deficits including memory loss (MMSE of 24/30). Patient reports being able to complete all of his ADLs independently at that time.He was instructed to continue requip and follow up in 6 months.  ? ? ?On evaluation today, patient is alert and oriented to person place time and event.  He states that he lives at home with his wife and is relatively functional.  However, he states that recently he has felt more unstable with his gait and attributes it to generalized weakness.  He does state that he has tremors in his hands and some difficulty with memory.  He states that he has had significant anxiety in the past but denies any REM sleep behaviors or hallucinations  ?  ?ROS  ? ?See above for ROS.  ? ?Past History  ? ?Past Medical History:  ?Diagnosis Date  ? Depression   ? Diabetes mellitus ([place])   ? Hyperlipidemia   ? Memory loss   ? Parkinson's disease ([place])   ? ?Past Surgical History:  ?Procedure Laterality  Date  ? CATARACT EXTRACTION W/ INTRAOCULAR LENS  IMPLANT, BILATERAL  2013  ? bilateral  ? TONSILLECTOMY  1944  ? ?Family History  ?Problem Relation Age of Onset  ? Dementia Mother   ? Colon cancer Neg Hx   ? Stomach cancer Neg Hx   ? ?Social History  ? ?Socioeconomic History  ? Marital status: Married  ?  Spouse name: Lenell Antu  ? Number of children: 1  ? Years of education: 83  ? Highest education level: Not on file  ?Occupational History  ? Not on file  ?Tobacco Use  ? Smoking status: Former  ?  Types: Cigarettes  ?  Quit date: 03/14/1974  ?  Years since quitting: 47.5  ? Smokeless tobacco: Never  ?Substance and Sexual Activity  ? Alcohol use: No  ? Drug use: No  ? Sexual activity: Not on file  ?Other Topics Concern  ? Not on file  ?Social History Narrative  ? Patient is married Personal assistant).  ? Patient has one son.  ? Patient is retired.  ? Patient has a high school education.  ? Patient is left-handed.  ? Patient drinks about 20 cups of coffee daily.  ? ?Social Determinants of Health  ? ?Financial Resource Strain: Not on file  ?Food Insecurity: Not on file  ?Transportation Needs: Not on file  ?Physical Activity: Not on file  ?Stress: Not on file  ?Social Connections: Not on file  ? ?No Known Allergies ? ?  Medications  ? ?Medications Prior to Admission  ?Medication Sig Dispense Refill Last Dose  ? ACCU-CHEK COMPACT PLUS test strip daily.   Past Week  ? ACCU-CHEK SOFTCLIX LANCETS lancets daily.   Past Week  ? atorvastatin (LIPITOR) 40 MG tablet Take 40 mg by mouth daily.   Past Week  ? JARDIANCE 25 MG TABS tablet Take 25 mg by mouth daily.   09/23/2021  ? JENTADUETO 2.5-500 MG TABS Take 1 tablet by mouth 2 (two) times daily.    09/23/2021  ? NOVOTWIST 32G X 5 MM MISC USE ONE PEN NEEDLE WITH TRESIBA INJECTION DAILY  6 09/23/2021  ? PARoxetine (PAXIL) 20 MG tablet Take 10 mg by mouth daily.   09/23/2021  ? TRESIBA FLEXTOUCH 200 UNIT/ML SOPN 12 Units daily.  5 09/23/2021  ? FLUAD QUADRIVALENT 0.5 ML injection  (Patient not taking:  Reported on 09/24/2021)   Not Taking  ? LORazepam (ATIVAN) 0.5 MG tablet 1 tablet at bedtime. (Patient not taking: Reported on 09/24/2021)   Not Taking  ?  ? ?Vitals  ? ?Vitals:  ? 09/27/21 0524 09/27/21 1310 09/27/21 2222 09/28/21 0519  ?BP: 126/72 (!) 126/57 123/71 123/62  ?Pulse: (!) 59 70 69 60  ?Resp: '16 15 16 16  '$ ?Temp: 98.1 ?F (36.7 ?C) 97.8 ?F (36.6 ?C) 98 ?F (36.7 ?C) 98 ?F (36.7 ?C)  ?TempSrc: Oral Oral  Oral  ?SpO2: 96% 94% 94% 94%  ?Weight:      ?Height:      ?  ? ?Body mass index is 28.89 kg/m?. ? ?Physical Exam  ? ?General: Laying comfortably in bed; in no acute distress.  ?HENT: Normal oropharynx and mucosa. Normal external appearance of ears and nose.  ?Neck: Supple, no pain or tenderness  ?CV: RRR. No peripheral edema.  ?Ext: No cyanosis, edema, or deformity  ?Skin: No rash. Normal palpation of skin.   ?Musculoskeletal: Normal digits and nails by inspection. No clubbing.  ? ?Neurologic Examination  ?Mental status/Cognition: Alert, oriented to self, place, month and year, good attention.  ?Speech/language: Fluent, comprehension intact, object naming intact, repetition intact.  ?Cranial nerves:  ? CN II Pupils equal and reactive to light, no VF deficits   ? CN III,IV,VI EOM intact, no gaze preference or deviation, no nystagmus   ? CN V normal sensation in V1, V2, and V3 segments bilaterally   ? CN VII no asymmetry, no nasolabial fold flattening   ? CN VIII normal hearing to speech   ? CN IX & X normal palatal elevation, no uvular deviation   ? CN XI 5/5 head turn and 5/5 shoulder shrug bilaterally   ? CN XII midline tongue protrusion   ? ?Motor:  ?Muscle bulk: normal, tone normal, pronator drift negative, tremor  upper extremity (L>R) ?Patient has significant cogwheel rigidity on upper and lower extremities. ?Retained 5 out of 5 strength in upper and lower extremities ? ?Reflexes: ?Normal reflexes in the upper left lower extremities. ? ?Sensation: ?Grossly intact ? ?Coordination/Complex Motor:  ?- Finger  to Nose intact with tremor ?-Patient has masked facies on gait exam with truncal instability ? ? ?Mini-Mental status exam performed today and he remains a 24/30 with problems with memory and calculations ? ?Labs  ? ?CBC:  ?Recent Labs  ?Lab 09/23/21 ?1550 09/24/21 ?0998 09/28/21 ?0422  ?WBC 5.9 4.1 4.2  ?NEUTROABS 4.9 2.7  --   ?HGB 15.7 14.4 14.8  ?HCT 45.5 40.8 43.2  ?MCV 95.2 96.5 93.3  ?PLT 171 145* 167  ? ? ?  Basic Metabolic Panel:  ?Lab Results  ?Component Value Date  ? NA 139 09/28/2021  ? K 4.0 09/28/2021  ? CO2 27 09/28/2021  ? GLUCOSE 193 (H) 09/28/2021  ? BUN 16 09/28/2021  ? CREATININE 0.87 09/28/2021  ? CALCIUM 8.4 (L) 09/28/2021  ? GFRNONAA >60 09/28/2021  ? GFRAA 97 09/28/2015  ? ?Lipid Panel: No results found for: [place] ?HgbA1c:  ?Lab Results  ?Component Value Date  ? HGBA1C 11.0 (H) 09/24/2021  ? ?Urine Drug Screen: No results found for: LABOPIA, COCAINSCRNUR, [place], [place], THCU, LABBARB  ?Alcohol Level No results found for: ETH ? ?MRI Brain (performed on 09/2015) ?1. No definitive explanation for memory loss. There is ?ventriculomegaly but congruent with prominent occipital parietal ?white matter volume loss. ?2. Mild, age congruent microvascular ischemic change. ? ? ?Impression  ? ?Parkinson's Disease: ?Patient presents with history of Parkinson's disease who is also follow-up.  Continues to have signs and symptoms of Parkinson's disease and was restarted on Requip 0.25 mg  2 times daily.  He states he is tolerating this well without any adverse effects.  We will likely continue this dose while inpatient.  He was previously on 0.5 mg nightly.  We will see if he needs to titrate up in the near future. ? ?Recommendations  ?Parkinson's disease ?-Continue Requip 0.25 mg twice daily ?- Will restart Carbidopa/levodopa while inpatient to see if he tolerates it.  ?- Will titrate meds in OP setting.  ?-PT OT and recommendations regarding home health ?-We will need follow-up with neurology in the  outpatient setting. ? ?______________________________________________________________________ ? ? ?Thank you for the opportunity to take part in the care of this patient. If you have any further questions, ple

## 2021-09-28 NOTE — Progress Notes (Signed)
?PROGRESS NOTE ? ? ? ?Matthew [name]  WSF:[id number] DOB: [birth year] DOA: 09/23/2021 ?PCP: Haywood Pao, MD  ? ? ? ?Brief Narrative:  ?87 y.o. WM PMHx dementia, Parkinson's disease, DM Type 2, HLD, chronic anxiety/depression  ? ?Presented from home to [facility] ED brought in by his wife due to lethargy and fatigue, associated with a subjective fever.  EMS was activated.  Blood pressure by EMS was 80/38 and CBG 432.  Patient was brought into the ED for further evaluation and management of his symptomatology.  In the ED, febrile with Tmax 100.7.  UA negative for pyuria.  No leukocytosis.  COVID-19, influenza A, and influenza B PCR are negative.  Chest x-ray nonacute. ? ? ?Subjective: ?Afebrile overnight, A/O x4 ? ? ?Assessment & Plan: ?Covid vaccination; vaccinated 2/3.  5/10 requests booster, ordered ?  ?Principal Problem: ?  Fever ?Active Problems: ?  Pressure injury of skin ?Fever, unclear cause ?Work-up with no evidence of active infective process. ?In the ED Tmax 100.7 rectally. Of note, pt reports over one week hx of constipation. Moderate sized stool today. Question validity of rectal temp at presentation ?UA, chest x-ray nonrevealing. ?No leukocytosis ?Pt has since remained afebrile off abx ?Blood and urine cx have returned neg ?  ?Lactic acidosis, unclear cause ?5/6 resolved ?  ?Acute metabolic acidosis of unclear cause ?Given IV fluid hydration ?Reorient as needed ?Fall precautions ?PT/OT recs for SNF noted.TOC following ?  ?Non anion gap metabolic acidosis ?-Resolved ? ?Type 2 diabetes with hyperglycemia ?-5/6 hemoglobin A1c = 11  ?Continue SSI while in hospital ?  ?Parkinson's disease/dementia ?[facility] ?Chart reviewed. Pt has been lost to follow up with Neuorology, was last seen in 2020 where pt was on Requip at that time. Pt had since been lost to follow up, again, requip not on current MAR ?Discussed case with Neurology. Recommendation to cont on 0.'25mg'$  requip while in room ?Pt scheduled to  see Neurology next month, would defer to outpt Neuro to uptitrate dose ?  ?Generalized weakness ?PT OT with recs for SNF, TOC following ?Fall precautions ?  ?Ileus, resolved ?-Nausea recently with abd xray revealing air-filled prominent loops of colon with findings suggesting colonic ileus ?-Resolved after moderate BM ?-Cont scheduled bowel regimen ?  ? ?Pressure Injury 09/24/21 Sacrum Stage 1 -  Intact skin with non-blanchable redness of a localized area usually over a bony prominence.  (Active)  ?09/24/21 1352  ?Location: Sacrum  ?Location Orientation:   ?Staging: Stage 1 -  Intact skin with non-blanchable redness of a localized area usually over a bony prominence.  ?Wound Description (Comments): -- (foam applied)  ?Present on Admission: Yes  ?Dressing Type Foam - Lift dressing to assess site every shift 09/27/21 2050  ? ? ? ?  ? ?Mobility Assessment (last 72 hours)   ? ? Mobility Assessment   ? ? [place] Name 09/28/21 [phone] 09/27/21 2050 09/27/21 0859 09/26/21 2013 09/26/21 1604  ? Does patient have an order for bedrest or is patient medically unstable -- No - Continue assessment -- No - Continue assessment --  ? What is the highest level of mobility based on the progressive mobility assessment? Level 4 (Walks with assist in room) - Balance while marching in place and cannot step forward and back - Complete Level 4 (Walks with assist in room) - Balance while marching in place and cannot step forward and back - Complete Level 4 (Walks with assist in room) - Balance while marching in  place and cannot step forward and back - Complete Level 4 (Walks with assist in room) - Balance while marching in place and cannot step forward and back - Complete Level 4 (Walks with assist in room) - Balance while marching in place and cannot step forward and back - Complete  ? ? [place] Name 09/26/21 0745 09/25/21 2150  ?  ?  ?  ? Does patient have an order for bedrest or is patient medically unstable No - Continue assessment No - Continue  assessment     ? What is the highest level of mobility based on the progressive mobility assessment? Level 4 (Walks with assist in room) - Balance while marching in place and cannot step forward and back - Complete Level 4 (Walks with assist in room) - Balance while marching in place and cannot step forward and back - Complete     ? ?  ?  ? ?  ? ? ? ?'@IPAL'$ @ ? ?   ?DVT prophylaxis: Lovenox ?Code Status: Full ?Family Communication:  ?Status is: Inpatient ? ? ? ?Dispo: The patient is from: Home ?             Anticipated d/c is to: SNF ?             Anticipated d/c date is: > 3 days ?             Patient currently is not medically stable to d/c. ? ? ? ? ? ?Consultants:  ? ? ?Procedures/Significant Events:  ? ? ?I have personally reviewed and interpreted all radiology studies and my findings are as above. ? ?VENTILATOR SETTINGS: ? ? ? ?Cultures ? ? ?Antimicrobials: ?Anti-infectives (From admission, onward)  ? ? Start     Dose/Rate Route Frequency Ordered Stop  ? 09/23/21 1700  cefTRIAXone (ROCEPHIN) 2 g in sodium chloride 0.9 % 100 mL IVPB       ? 2 g ?200 mL/hr over 30 Minutes Intravenous  Once 09/23/21 1655 09/23/21 1751  ? ?  ?  ? ? ?Devices ?  ? ?LINES / TUBES:  ? ? ? ? ?Continuous Infusions: ? ? ?Objective: ?Vitals:  ? 09/27/21 0524 09/27/21 1310 09/27/21 2222 09/28/21 0519  ?BP: 126/72 (!) 126/57 123/71 123/62  ?Pulse: (!) 59 70 69 60  ?Resp: '16 15 16 16  '$ ?Temp: 98.1 ?F (36.7 ?C) 97.8 ?F (36.6 ?C) 98 ?F (36.7 ?C) 98 ?F (36.7 ?C)  ?TempSrc: Oral Oral  Oral  ?SpO2: 96% 94% 94% 94%  ?Weight:      ?Height:      ? ? ?Intake/Output Summary (Last 24 hours) at 09/28/2021 1129 ?Last data filed at 09/28/2021 1000 ?Gross per 24 hour  ?Intake 1140 ml  ?Output 1750 ml  ?Net -610 ml  ? ?Filed Weights  ? 09/23/21 2105  ?Weight: 86.2 kg  ? ? ?Examination: ? ?General: A/O x4, No acute respiratory distress, sitting in chair  ?Eyes: negative scleral hemorrhage, negative anisocoria, negative icterus ?ENT: Negative Runny nose, negative  gingival bleeding, ?Neck:  Negative scars, masses, torticollis, lymphadenopathy, JVD ?Lungs: Clear to auscultation bilaterally without wheezes or crackles ?Cardiovascular: Regular rate and rhythm without murmur gallop or rub normal S1 and S2 ?Abdomen: negative abdominal pain, nondistended, positive soft, bowel sounds, no rebound, no ascites, no appreciable mass ?Extremities: positive RIGHT femur pain to palpation.  Positive muscle spasm RIGHT quadriceps ?Skin: Negative rashes, lesions, ulcers ?Psychiatric:  Negative depression, negative anxiety, negative fatigue, negative mania  ?Central nervous system:  Cranial nerves  II through XII intact, tongue/uvula midline, all extremities muscle strength 5/5, sensation intact throughout, negative dysarthria, negative expressive aphasia, negative receptive aphasia. ? ?.  ? ? ? ?Data Reviewed: Care during the described time interval was provided by me .  I have reviewed this patient's available data, including medical history, events of note, physical examination, and all test results as part of my evaluation. ? ?CBC: ?Recent Labs  ?Lab 09/23/21 ?1550 09/24/21 ?3474 09/28/21 ?0422  ?WBC 5.9 4.1 4.2  ?NEUTROABS 4.9 2.7  --   ?HGB 15.7 14.4 14.8  ?HCT 45.5 40.8 43.2  ?MCV 95.2 96.5 93.3  ?PLT 171 145* 167  ? ?Basic Metabolic Panel: ?Recent Labs  ?Lab 09/23/21 ?1726 09/24/21 ?0652 09/25/21 ?0329 09/27/21 ?2595 09/28/21 ?0422  ?NA 139  --  136 138 139  ?K 4.5  --  3.7 3.7 4.0  ?CL 109  --  108 106 104  ?CO2 20*  --  '22 25 27  '$ ?GLUCOSE 308*  --  199* 178* 193*  ?BUN 25*  --  26* 17 16  ?CREATININE 0.96  --  0.74 0.77 0.87  ?CALCIUM 7.7*  --  7.8* 8.0* 8.4*  ?MG  --  2.3  --   --   --   ?PHOS  --  2.7  --   --   --   ? ?GFR: ?Estimated Creatinine Clearance: 66.3 mL/min (by C-G formula based on SCr of 0.87 mg/dL). ?Liver Function Tests: ?Recent Labs  ?Lab 09/23/21 ?1726 09/25/21 ?0329 09/27/21 ?0428 09/28/21 ?0422  ?AST '23 19 25 '$ 59*  ?ALT '11 10 16 '$ 42  ?ALKPHOS 63 87 56 43  ?BILITOT  1.1 0.7 0.9 0.7  ?PROT 6.3* 5.7* 5.6* 5.8*  ?ALBUMIN 3.2* 2.8* 2.9* 2.9*  ? ?No results for input(s): LIPASE, AMYLASE in the last 168 hours. ?No results for input(s): AMMONIA in the last 168 hours. ?[place]

## 2021-09-29 DIAGNOSIS — R531 Weakness: Secondary | ICD-10-CM | POA: Diagnosis not present

## 2021-09-29 DIAGNOSIS — R509 Fever, unspecified: Secondary | ICD-10-CM | POA: Diagnosis present

## 2021-09-29 DIAGNOSIS — E1165 Type 2 diabetes mellitus with hyperglycemia: Secondary | ICD-10-CM

## 2021-09-29 DIAGNOSIS — F0390 Unspecified dementia without behavioral disturbance: Secondary | ICD-10-CM | POA: Diagnosis not present

## 2021-09-29 DIAGNOSIS — G20 Parkinson's disease: Secondary | ICD-10-CM | POA: Diagnosis not present

## 2021-09-29 DIAGNOSIS — K567 Ileus, unspecified: Secondary | ICD-10-CM | POA: Diagnosis present

## 2021-09-29 LAB — CBC WITH DIFFERENTIAL/PLATELET
Abs Immature Granulocytes: 0.02 10*3/uL (ref 0.00–0.07)
Basophils Absolute: 0.1 10*3/uL (ref 0.0–0.1)
Basophils Relative: 1 %
Eosinophils Absolute: 0.1 10*3/uL (ref 0.0–0.5)
Eosinophils Relative: 3 %
HCT: 45.7 % (ref 39.0–52.0)
Hemoglobin: 15.8 g/dL (ref 13.0–17.0)
Immature Granulocytes: 0 %
Lymphocytes Relative: 38 %
Lymphs Abs: 1.8 10*3/uL (ref 0.7–4.0)
MCH: 32.8 pg (ref 26.0–34.0)
MCHC: 34.6 g/dL (ref 30.0–36.0)
MCV: 95 fL (ref 80.0–100.0)
Monocytes Absolute: 0.5 10*3/uL (ref 0.1–1.0)
Monocytes Relative: 11 %
Neutro Abs: 2.2 10*3/uL (ref 1.7–7.7)
Neutrophils Relative %: 47 %
Platelets: 177 10*3/uL (ref 150–400)
RBC: 4.81 MIL/uL (ref 4.22–5.81)
RDW: 13.3 % (ref 11.5–15.5)
WBC: 4.7 10*3/uL (ref 4.0–10.5)
nRBC: 0 % (ref 0.0–0.2)

## 2021-09-29 LAB — COMPREHENSIVE METABOLIC PANEL
ALT: 81 U/L — ABNORMAL HIGH (ref 0–44)
AST: 98 U/L — ABNORMAL HIGH (ref 15–41)
Albumin: 3.1 g/dL — ABNORMAL LOW (ref 3.5–5.0)
Alkaline Phosphatase: 71 U/L (ref 38–126)
Anion gap: 8 (ref 5–15)
BUN: 18 mg/dL (ref 8–23)
CO2: 26 mmol/L (ref 22–32)
Calcium: 8.6 mg/dL — ABNORMAL LOW (ref 8.9–10.3)
Chloride: 103 mmol/L (ref 98–111)
Creatinine, Ser: 0.86 mg/dL (ref 0.61–1.24)
GFR, Estimated: >60 mL/min (ref >60)
Glucose, Bld: 197 mg/dL — ABNORMAL HIGH (ref 70–99)
Potassium: 4.3 mmol/L (ref 3.5–5.1)
Sodium: 137 mmol/L (ref 135–145)
Total Bilirubin: 0.7 mg/dL (ref 0.3–1.2)
Total Protein: 6.4 g/dL — ABNORMAL LOW (ref 6.5–8.1)

## 2021-09-29 LAB — GLUCOSE, CAPILLARY
Glucose-Capillary: 199 mg/dL — ABNORMAL HIGH (ref 70–99)
Glucose-Capillary: 395 mg/dL — ABNORMAL HIGH (ref 70–99)
Glucose-Capillary: 244 mg/dL — ABNORMAL HIGH (ref 70–99)
Glucose-Capillary: 228 mg/dL — ABNORMAL HIGH (ref 70–99)

## 2021-09-29 LAB — MAGNESIUM: Magnesium: 2.3 mg/dL (ref 1.7–2.4)

## 2021-09-29 LAB — PHOSPHORUS: Phosphorus: 3.6 mg/dL (ref 2.5–4.6)

## 2021-09-29 MED ORDER — INSULIN GLARGINE-YFGN 100 UNIT/ML ~~LOC~~ SOLN
12.0000 [IU] | Freq: Every day | SUBCUTANEOUS | Status: DC
  Filled 2021-09-29: qty 0.12

## 2021-09-29 MED ORDER — INSULIN GLARGINE-YFGN 100 UNIT/ML ~~LOC~~ SOLN
12.0000 [IU] | Freq: Every day | SUBCUTANEOUS | Status: DC
  Administered 2021-09-29: 12 [IU] via SUBCUTANEOUS
  Filled 2021-09-29 (×2): qty 0.12

## 2021-09-29 NOTE — Progress Notes (Signed)
Inpatient Diabetes Program Recommendations ? ?AACE/ADA: New Consensus Statement on Inpatient Glycemic Control (2015) ? ?Target Ranges:  Prepandial:   less than 140 mg/dL ?     Peak postprandial:   less than 180 mg/dL (1-2 hours) ?     Critically ill patients:  140 - 180 mg/dL  ? ?Lab Results  ?Component Value Date  ? GLUCAP 199 (H) 09/29/2021  ? HGBA1C 11.0 (H) 09/24/2021  ? ? ?Review of Glycemic Control ? Latest Reference Range & Units 09/28/21 07:30 09/28/21 11:12 09/28/21 16:56 09/28/21 21:05 09/29/21 07:15  ?Glucose-Capillary 70 - 99 mg/dL 169 (H) 307 (H) 226 (H) 238 (H) 199 (H)  ?(H): Data is abnormally high ? ?Diabetes history: DM2 ?Outpatient Diabetes medications: Jardiance 25 mg QD, Jentadueto 2.5-500 mg BID, Tresiba 12 units QD ?Current orders for Inpatient glycemic control: Semglee 10 units QD, Novolog 0-9 units TID and 0-5 qhs ? ?Inpatient Diabetes Program Recommendations:   ? ?Semglee 12 units QAM ? ?Will continue to follow while inpatient. ? ?Thank you, ?Reche Dixon, MSN, CDCES ?Diabetes Coordinator ?Inpatient Diabetes Program ?[phone] (team pager from 8a-5p) ? ?

## 2021-09-29 NOTE — Progress Notes (Addendum)
?PROGRESS NOTE ? ? ? ?Matthew [name]  PRF:[id number] DOB: 1935/01/29 DOA: 09/23/2021 ?PCP: Haywood Pao, MD  ? ? ? ?Brief Narrative:  ?87 y.o. WM PMHx dementia, Parkinson's disease, DM Type 2, HLD, chronic anxiety/depression  ? ?Presented from home to [facility] ED brought in by his wife due to lethargy and fatigue, associated with a subjective fever.  EMS was activated.  Blood pressure by EMS was 80/38 and CBG 432.  Patient was brought into the ED for further evaluation and management of his symptomatology.  In the ED, febrile with Tmax 100.7.  UA negative for pyuria.  No leukocytosis.  COVID-19, influenza A, and influenza B PCR are negative.  Chest x-ray nonacute. ? ? ?Subjective: ?5/11 afebrile overnight.  A/O x4 sitting in chair comfortably.  Understands still awaiting insurance clearance for SNF placement ? ? ? ?Assessment & Plan: ?Covid vaccination; vaccinated 2/3.  5/10 requests booster, ordered.  5/11 per pharmacy boosters no longer available ?  ?Principal Problem: ?  Fever ?Active Problems: ?  Parkinson disease ([place]) ?  Pressure injury of skin ?  FUO (fever of unknown origin) ?  Uncontrolled type 2 diabetes mellitus with hyperglycemia ([place]) ?  Dementia without behavioral disturbance ([place]) ?  Generalized weakness ?  Ileus ([place]) ? ?Fever, unknown origin ?-Work-up with no evidence of active infective process. ?-In the ED Tmax 100.7 rectally. Of note, pt reports over one week hx of constipation. Moderate sized stool today. Question validity of rectal temp at presentation ?-UA, chest x-ray nonrevealing. ?No leukocytosis ?-Pt has since remained afebrile off abx ?-Blood and urine cx have returned neg ?-5/11 resolved ?  ?Lactic acidosis, unclear cause ?5/6 resolved ?  ?Acute metabolic acidosis of unclear cause ?Given IV fluid hydration ?Reorient as needed ?Fall precautions ?-PT/OT recs for SNF noted.TOC following ?  ?Non anion gap metabolic acidosis ?-Resolved ? ?Type 2 diabetes with hyperglycemia ?-5/6 hemoglobin  A1c = 11  ?-5/11 Tresiba 12 units daily ?-5/11 sensitive SSI  ?  ?Parkinson's disease/dementia ?Not on meds per [facility] ?Chart reviewed. Pt has been lost to follow up with Neuorology, was last seen in 2020 where pt was on Requip at that time. Pt had since been lost to follow up, again, requip not on current [facility] ?-Discussed case with Neurology.  ?-Per neurology recommendation Requip 0.'25mg'$   BID ?-Pt scheduled to see Neurology next month, would defer to outpt Neuro to uptitrate dose ?  ?Generalized weakness ?-PT OT with recs for SNF, TOC following ?-5/11 awaiting insurance authorization for SNF.   ?  ?Ileus, resolved ?-Nausea recently with abd xray revealing air-filled prominent loops of colon with findings suggesting colonic ileus ?-Resolved after moderate BM ?-Cont scheduled bowel regimen ?  ? ?Pressure Injury 09/24/21 Sacrum Stage 1 -  Intact skin with non-blanchable redness of a localized area usually over a bony prominence.  (Active)  ?09/24/21 1352  ?Location: Sacrum  ?Location Orientation:   ?Staging: Stage 1 -  Intact skin with non-blanchable redness of a localized area usually over a bony prominence.  ?Wound Description (Comments): -- (foam applied)  ?Present on Admission: Yes  ?Dressing Type Foam - Lift dressing to assess site every shift 09/28/21 2100  ? ? ? ?  ? ?Mobility Assessment (last 72 hours)   ? ? Mobility Assessment   ? ? [place] Name 09/29/21 [phone] 09/28/21 0853 09/27/21 2050 09/27/21 0859 09/26/21 2013  ? Does patient have an order for bedrest or is patient medically unstable No - Continue assessment -- No -  Continue assessment -- No - Continue assessment  ? What is the highest level of mobility based on the progressive mobility assessment? Level 4 (Walks with assist in room) - Balance while marching in place and cannot step forward and back - Complete Level 4 (Walks with assist in room) - Balance while marching in place and cannot step forward and back - Complete Level 4 (Walks with assist in room) -  Balance while marching in place and cannot step forward and back - Complete Level 4 (Walks with assist in room) - Balance while marching in place and cannot step forward and back - Complete Level 4 (Walks with assist in room) - Balance while marching in place and cannot step forward and back - Complete  ? ?  ?  ? ?  ? ? ? ?'@IPAL'$ @ ? ?   ?DVT prophylaxis: Lovenox ?Code Status: Full ?Family Communication:  ?Status is: Inpatient ? ? ? ?Dispo: The patient is from: Home ?             Anticipated d/c is to: SNF ?             Anticipated d/c date is: > 3 days ?             Patient currently is not medically stable to d/c. ? ? ? ? ? ?Consultants:  ? ? ?Procedures/Significant Events:  ?5/10 DG RIGHT femur: Negative fracture dislocation ? ? ?I have personally reviewed and interpreted all radiology studies and my findings are as above. ? ?VENTILATOR SETTINGS: ? ? ? ?Cultures ? ? ?Antimicrobials: ?Anti-infectives (From admission, onward)  ? ? Start     Ordered Stop  ? 09/23/21 1700  cefTRIAXone (ROCEPHIN) 2 g in sodium chloride 0.9 % 100 mL IVPB       ? 09/23/21 1655 09/23/21 1751  ? ?  ?  ? ? ?Devices ?  ? ?LINES / TUBES:  ? ? ? ? ?Continuous Infusions: ? ? ?Objective: ?Vitals:  ? 09/28/21 1435 09/28/21 2109 09/29/21 0518 09/29/21 1234  ?BP: 130/68 128/67 (!) 120/59 (!) 124/52  ?Pulse: 67 (!) 59 62 65  ?Resp: '14 14 14 16  '$ ?Temp: 98 ?F (36.7 ?C) (!) 97.5 ?F (36.4 ?C) (!) 97.5 ?F (36.4 ?C) 98 ?F (36.7 ?C)  ?TempSrc: Oral Oral Oral   ?SpO2: 97% 97% 96% 96%  ?Weight:      ?Height:      ? ? ?Intake/Output Summary (Last 24 hours) at 09/29/2021 1805 ?Last data filed at 09/29/2021 1703 ?Gross per 24 hour  ?Intake 840 ml  ?Output 3000 ml  ?Net -2160 ml  ? ?Filed Weights  ? 09/23/21 2105  ?Weight: 86.2 kg  ? ? ?Examination: ? ?General: A/O x4, No acute respiratory distress, sitting in chair  ?Eyes: negative scleral hemorrhage, negative anisocoria, negative icterus ?ENT: Negative Runny nose, negative gingival bleeding, ?Neck:  Negative  scars, masses, torticollis, lymphadenopathy, JVD ?Lungs: Clear to auscultation bilaterally without wheezes or crackles ?Cardiovascular: Regular rate and rhythm without murmur gallop or rub normal S1 and S2 ?Abdomen: negative abdominal pain, nondistended, positive soft, bowel sounds, no rebound, no ascites, no appreciable mass ?Extremities: negative RIGHT femur pain to palpation.  Positive muscle spasm RIGHT quadriceps ?Skin: Negative rashes, lesions, ulcers ?Psychiatric:  Negative depression, negative anxiety, negative fatigue, negative mania  ?Central nervous system:  Cranial nerves II through XII intact, tongue/uvula midline, all extremities muscle strength 5/5, sensation intact throughout, negative dysarthria, negative expressive aphasia, negative receptive aphasia. ? ?.  ? ? ? ?  Data Reviewed: Care during the described time interval was provided by me .  I have reviewed this patient's available data, including medical history, events of note, physical examination, and all test results as part of my evaluation. ? ?CBC: ?Recent Labs  ?Lab 09/23/21 ?1550 09/24/21 ?0177 09/28/21 ?0422 09/29/21 ?0441  ?WBC 5.9 4.1 4.2 4.7  ?NEUTROABS 4.9 2.7  --  2.2  ?HGB 15.7 14.4 14.8 15.8  ?HCT 45.5 40.8 43.2 45.7  ?MCV 95.2 96.5 93.3 95.0  ?PLT 171 145* 167 177  ? ?Basic Metabolic Panel: ?Recent Labs  ?Lab 09/23/21 ?1726 09/24/21 ?9390 09/25/21 ?0329 09/27/21 ?0428 09/28/21 ?0422 09/29/21 ?0441  ?NA 139  --  136 138 139 137  ?K 4.5  --  3.7 3.7 4.0 4.3  ?CL 109  --  108 106 104 103  ?CO2 20*  --  '22 25 27 26  '$ ?GLUCOSE 308*  --  199* 178* 193* 197*  ?BUN 25*  --  26* '17 16 18  '$ ?CREATININE 0.96  --  0.74 0.77 0.87 0.86  ?CALCIUM 7.7*  --  7.8* 8.0* 8.4* 8.6*  ?MG  --  2.3  --   --   --  2.3  ?PHOS  --  2.7  --   --   --  3.6  ? ?GFR: ?Estimated Creatinine Clearance: 67.1 mL/min (by C-G formula based on SCr of 0.86 mg/dL). ?Liver Function Tests: ?Recent Labs  ?Lab 09/23/21 ?1726 09/25/21 ?0329 09/27/21 ?0428 09/28/21 ?0422  09/29/21 ?0441  ?AST '23 19 25 '$ 59* 98*  ?ALT '11 10 16 '$ 42 81*  ?ALKPHOS 30 09 23 30 07  ?BILITOT 1.1 0.7 0.9 0.7 0.7  ?PROT 6.3* 5.7* 5.6* 5.8* 6.4*  ?ALBUMIN 3.2* 2.8* 2.9* 2.9* 3.1*  ? ?No results for input(s): LIPASE,

## 2021-09-29 NOTE — Progress Notes (Signed)
Physical Therapy Treatment ?Patient Details ?Name: Matthew [name] ?MRN: [id number] ?DOB: 07-22-1934 ?Today's Date: 09/29/2021 ? ? ?History of Present Illness Presented from home to [facility] ED brought in by his wife due to lethargy and fatigue, associated with a subjective fever.  EMS was activated.  Blood pressure by EMS was 80/38 and CBG 432.  Patient was brought into the ED for further evaluation and management of his symptomatology.  In the ED, febrile with Tmax 100.7.  UA negative for pyuria.  No leukocytosis.  COVID-19, influenza A, and influenza B PCR are negative.  Chest x-ray nonacute. Neurology now on board addressing Parkinson's symtoms. ? ?  ?PT Comments  ? ? Neurology Consult much appreciated to address pt's motor skills and gait instability. ?Prior to admit, pt was self ambulating but also had a Hx falls.   ?Assisted OOB to amb to bathroom required increased time.  General bed mobility comments: required Mod/Max Assist for upper body and Max Assist to complete scooting buttocks to EOB.  Rigidity/freezing. General transfer comment: required increased asisst this session due to Parkinson's like motor initiation and control.  Also assisted with a toilet transfer. Bradykinesia and rigidity.  HIGH FALL RISK.  General Gait Details: tolerated an increased distnce to and from bathroom as well as in hallway a short distance.  75% VC's to correct festination/freezing "step big", and increased time to complete gait distance. Pt progressing but will need ST Rehab at SNF prior to safely returning home. ?  ?Recommendations for follow up therapy are one component of a multi-disciplinary discharge planning process, led by the attending physician.  Recommendations may be updated based on patient status, additional functional criteria and insurance authorization. ? ?Follow Up Recommendations ? Skilled nursing-short term rehab (<3 hours/day) ?  ?  ?Assistance Recommended at Discharge    ?Patient can return home with the  following A lot of help with walking and/or transfers;A lot of help with bathing/dressing/bathroom;Assistance with cooking/housework;Assist for transportation ?  ?Equipment Recommendations ?    ?  ?Recommendations for Other Services   ? ? ?  ?Precautions / Restrictions Precautions ?Precautions: Fall ?Precaution Comments: several falls per patient, Hx Parkinson's ?Restrictions ?Weight Bearing Restrictions: No  ?  ? ?Mobility ? Bed Mobility ?Overal bed mobility: Needs Assistance ?  ?  ?  ?Supine to sit: Mod assist, Max assist ?  ?  ?General bed mobility comments: required Mod/Max Assist for upper body and Max Assist to complete scooting buttocks to EOB.  Rigidity/freezing. ?  ? ?Transfers ?Overall transfer level: Needs assistance ?Equipment used: Rolling walker (2 wheels) ?Transfers: Sit to/from Stand ?Sit to Stand: Mod assist ?  ?  ?  ?  ?  ?General transfer comment: required increased asisst this session due to Parkinson's like motor initiation and control.  Also assisted with a toilet transfer. ?  ? ?Ambulation/Gait ?Ambulation/Gait assistance: Mod assist ?Gait Distance (Feet): 34 Feet ?Assistive device: Rolling walker (2 wheels) ?Gait Pattern/deviations: Decreased step length - right, Decreased step length - left, Knee flexed in stance - right, Festinating ?Gait velocity: decreased ?  ?  ?General Gait Details: tolerated an increased distnce to and from bathroom as well as in hallway a short distance.  75% VC's to correct festination/freezing "step big", and increased time to complete gait distance. ? ? ?Stairs ?  ?  ?  ?  ?  ? ? ?Wheelchair Mobility ?  ? ?Modified Rankin (Stroke Patients Only) ?  ? ? ?  ?Balance   ?  ?  ?  ?  ?  ?  ?  ?  ?  ?  ?  ?  ?  ?  ?  ?  ?  ?  ?  ? ?  ?  Cognition Arousal/Alertness: Awake/alert ?  ?  ?  ?  ?  ?  ?  ?  ?  ?  ?  ?  ?  ?  ?  ?  ?  ?  ?General Comments: AxO x 3 very pleasant Retired from [employer] currently lives with spouse but now requires more care due to gait instability  and general weakness. ?  ?  ? ?  ?Exercises   ? ?  ?General Comments   ?  ?  ? ?Pertinent Vitals/Pain    ? ? ?Home Living   ?  ?  ?  ?  ?  ?  ?  ?  ?  ?   ?  ?Prior Function    ?  ?  ?   ? ?PT Goals (current goals can now be found in the care plan section)   ? ?  ?Frequency ? ? ? Min 2X/week ? ? ? ?  ?PT Plan Current plan remains appropriate  ? ? ?Co-evaluation   ?  ?  ?  ?  ? ?  ?AM-PAC PT "6 Clicks" Mobility   ?Outcome Measure ? Help needed turning from your back to your side while in a flat bed without using bedrails?: A Lot ?Help needed moving from lying on your back to sitting on the side of a flat bed without using bedrails?: A Lot ?Help needed moving to and from a bed to a chair (including a wheelchair)?: A Lot ?Help needed standing up from a chair using your arms (e.g., wheelchair or bedside chair)?: A Lot ?Help needed to walk in hospital room?: A Lot ?Help needed climbing 3-5 steps with a railing? : Total ?6 Click Score: 11 ? ?  ?End of Session Equipment Utilized During Treatment: Gait belt ?Activity Tolerance: Patient tolerated treatment well ?  ?Nurse Communication: Mobility status ?  ?  ? ? ?Time: 9163-8466 ?PT Time Calculation (min) (ACUTE ONLY): 34 min ? ?Charges:  $Gait Training: 8-22 mins ?$Therapeutic Activity: 8-22 mins          ?          ? ?Rica Koyanagi  PTA ?Acute  Rehabilitation Services ?Pager      [phone] ?Office      [phone] ? ?

## 2021-09-30 DIAGNOSIS — Z7401 Bed confinement status: Secondary | ICD-10-CM | POA: Diagnosis not present

## 2021-09-30 DIAGNOSIS — Z794 Long term (current) use of insulin: Secondary | ICD-10-CM | POA: Diagnosis not present

## 2021-09-30 DIAGNOSIS — L309 Dermatitis, unspecified: Secondary | ICD-10-CM | POA: Diagnosis not present

## 2021-09-30 DIAGNOSIS — E8721 Acute metabolic acidosis: Secondary | ICD-10-CM | POA: Diagnosis not present

## 2021-09-30 DIAGNOSIS — F0390 Unspecified dementia without behavioral disturbance: Secondary | ICD-10-CM | POA: Diagnosis not present

## 2021-09-30 DIAGNOSIS — F39 Unspecified mood [affective] disorder: Secondary | ICD-10-CM | POA: Diagnosis not present

## 2021-09-30 DIAGNOSIS — K567 Ileus, unspecified: Secondary | ICD-10-CM | POA: Diagnosis not present

## 2021-09-30 DIAGNOSIS — R531 Weakness: Secondary | ICD-10-CM | POA: Diagnosis not present

## 2021-09-30 DIAGNOSIS — R2681 Unsteadiness on feet: Secondary | ICD-10-CM | POA: Diagnosis not present

## 2021-09-30 DIAGNOSIS — Z8719 Personal history of other diseases of the digestive system: Secondary | ICD-10-CM | POA: Diagnosis not present

## 2021-09-30 DIAGNOSIS — F4323 Adjustment disorder with mixed anxiety and depressed mood: Secondary | ICD-10-CM | POA: Diagnosis not present

## 2021-09-30 DIAGNOSIS — E1165 Type 2 diabetes mellitus with hyperglycemia: Secondary | ICD-10-CM | POA: Diagnosis not present

## 2021-09-30 DIAGNOSIS — G3183 Dementia with Lewy bodies: Secondary | ICD-10-CM | POA: Diagnosis not present

## 2021-09-30 DIAGNOSIS — G20 Parkinson's disease: Secondary | ICD-10-CM | POA: Diagnosis not present

## 2021-09-30 DIAGNOSIS — R1312 Dysphagia, oropharyngeal phase: Secondary | ICD-10-CM | POA: Diagnosis not present

## 2021-09-30 DIAGNOSIS — R5381 Other malaise: Secondary | ICD-10-CM | POA: Diagnosis not present

## 2021-09-30 DIAGNOSIS — R41841 Cognitive communication deficit: Secondary | ICD-10-CM | POA: Diagnosis not present

## 2021-09-30 DIAGNOSIS — F32A Depression, unspecified: Secondary | ICD-10-CM | POA: Diagnosis not present

## 2021-09-30 DIAGNOSIS — M6281 Muscle weakness (generalized): Secondary | ICD-10-CM | POA: Diagnosis not present

## 2021-09-30 DIAGNOSIS — R509 Fever, unspecified: Secondary | ICD-10-CM | POA: Diagnosis not present

## 2021-09-30 DIAGNOSIS — R2689 Other abnormalities of gait and mobility: Secondary | ICD-10-CM | POA: Diagnosis not present

## 2021-09-30 DIAGNOSIS — I959 Hypotension, unspecified: Secondary | ICD-10-CM | POA: Diagnosis not present

## 2021-09-30 DIAGNOSIS — L304 Erythema intertrigo: Secondary | ICD-10-CM | POA: Diagnosis not present

## 2021-09-30 DIAGNOSIS — G4733 Obstructive sleep apnea (adult) (pediatric): Secondary | ICD-10-CM | POA: Diagnosis not present

## 2021-09-30 LAB — GLUCOSE, CAPILLARY
Glucose-Capillary: 129 mg/dL — ABNORMAL HIGH (ref 70–99)
Glucose-Capillary: 240 mg/dL — ABNORMAL HIGH (ref 70–99)

## 2021-09-30 LAB — COMPREHENSIVE METABOLIC PANEL
ALT: 69 U/L — ABNORMAL HIGH (ref 0–44)
AST: 70 U/L — ABNORMAL HIGH (ref 15–41)
Albumin: 3.2 g/dL — ABNORMAL LOW (ref 3.5–5.0)
Alkaline Phosphatase: 72 U/L (ref 38–126)
Anion gap: 7 (ref 5–15)
BUN: 19 mg/dL (ref 8–23)
CO2: 28 mmol/L (ref 22–32)
Calcium: 9 mg/dL (ref 8.9–10.3)
Chloride: 105 mmol/L (ref 98–111)
Creatinine, Ser: 0.82 mg/dL (ref 0.61–1.24)
GFR, Estimated: >60 mL/min (ref >60)
Glucose, Bld: 131 mg/dL — ABNORMAL HIGH (ref 70–99)
Potassium: 4.3 mmol/L (ref 3.5–5.1)
Sodium: 140 mmol/L (ref 135–145)
Total Bilirubin: 0.7 mg/dL (ref 0.3–1.2)
Total Protein: 6.3 g/dL — ABNORMAL LOW (ref 6.5–8.1)

## 2021-09-30 LAB — CBC WITH DIFFERENTIAL/PLATELET
Abs Immature Granulocytes: 0.02 10*3/uL (ref 0.00–0.07)
Basophils Absolute: 0 10*3/uL (ref 0.0–0.1)
Basophils Relative: 1 %
Eosinophils Absolute: 0.2 10*3/uL (ref 0.0–0.5)
Eosinophils Relative: 3 %
HCT: 45.4 % (ref 39.0–52.0)
Hemoglobin: 15.5 g/dL (ref 13.0–17.0)
Immature Granulocytes: 0 %
Lymphocytes Relative: 38 %
Lymphs Abs: 1.9 10*3/uL (ref 0.7–4.0)
MCH: 32.1 pg (ref 26.0–34.0)
MCHC: 34.1 g/dL (ref 30.0–36.0)
MCV: 94 fL (ref 80.0–100.0)
Monocytes Absolute: 0.5 10*3/uL (ref 0.1–1.0)
Monocytes Relative: 10 %
Neutro Abs: 2.5 10*3/uL (ref 1.7–7.7)
Neutrophils Relative %: 48 %
Platelets: 204 10*3/uL (ref 150–400)
RBC: 4.83 MIL/uL (ref 4.22–5.81)
RDW: 13.2 % (ref 11.5–15.5)
WBC: 5.1 10*3/uL (ref 4.0–10.5)
nRBC: 0 % (ref 0.0–0.2)

## 2021-09-30 LAB — MAGNESIUM: Magnesium: 2.1 mg/dL (ref 1.7–2.4)

## 2021-09-30 LAB — PHOSPHORUS: Phosphorus: 4.1 mg/dL (ref 2.5–4.6)

## 2021-09-30 MED ORDER — CYCLOBENZAPRINE HCL 5 MG PO TABS: 5.0000 mg | ORAL_TABLET | Freq: Three times a day (TID) | ORAL | 0 refills | Status: AC | PRN

## 2021-09-30 MED ORDER — EMPAGLIFLOZIN 25 MG PO TABS
25.0000 mg | ORAL_TABLET | Freq: Every day | ORAL | Status: DC
  Administered 2021-09-30: 25 mg via ORAL
  Filled 2021-09-30: qty 1

## 2021-09-30 MED ORDER — CARBIDOPA-LEVODOPA 25-100 MG PO TABS
0.5000 | ORAL_TABLET | Freq: Three times a day (TID) | ORAL | Status: DC
  Administered 2021-09-30: 0.5 via ORAL
  Filled 2021-09-30 (×3): qty 0.5

## 2021-09-30 MED ORDER — POLYETHYLENE GLYCOL 3350 17 G PO PACK: 17.0000 g | PACK | Freq: Every day | ORAL | 0 refills | Status: AC

## 2021-09-30 MED ORDER — CARBIDOPA-LEVODOPA 25-100 MG PO TABS: 0.5000 | ORAL_TABLET | Freq: Three times a day (TID) | ORAL | 0 refills | Status: AC

## 2021-09-30 MED ORDER — MELATONIN 3 MG PO TABS: 3.0000 mg | ORAL_TABLET | Freq: Every evening | ORAL | 0 refills | Status: AC | PRN

## 2021-09-30 MED ORDER — ROPINIROLE HCL 0.25 MG PO TABS: 0.2500 mg | ORAL_TABLET | Freq: Two times a day (BID) | ORAL | 0 refills | Status: AC

## 2021-09-30 NOTE — Final Progress Note (Signed)
Patient report was called to [employer], at [facility]. All questions were answered AVS placed in package for facility. Pt awaiting [facility] arrival.  ?

## 2021-09-30 NOTE — Progress Notes (Incomplete)
?PROGRESS NOTE ? ? ? ?Matthew [name]  TOI:[id number] DOB: 1935-02-27 DOA: 09/23/2021 ?PCP: Haywood Pao, MD  ? ? ? ?Brief Narrative:  ?87 y.o. WM PMHx dementia, Parkinson's disease, DM Type 2, HLD, chronic anxiety/depression  ? ?Presented from home to [facility] ED brought in by his wife due to lethargy and fatigue, associated with a subjective fever.  EMS was activated.  Blood pressure by EMS was 80/38 and CBG 432.  Patient was brought into the ED for further evaluation and management of his symptomatology.  In the ED, febrile with Tmax 100.7.  UA negative for pyuria.  No leukocytosis.  COVID-19, influenza A, and influenza B PCR are negative.  Chest x-ray nonacute. ? ? ?Subjective: ?5/12 afebrile overnight ? ? ? ? ?Assessment & Plan: ?Covid vaccination; vaccinated 2/3.  5/10 requests booster, ordered.  5/11 per pharmacy boosters no longer available ?  ?Principal Problem: ?  Fever ?Active Problems: ?  Parkinson disease ([place]) ?  Pressure injury of skin ?  FUO (fever of unknown origin) ?  Uncontrolled type 2 diabetes mellitus with hyperglycemia ([place]) ?  Dementia without behavioral disturbance ([place]) ?  Generalized weakness ?  Ileus ([place]) ? ?Fever, unknown origin ?-Work-up with no evidence of active infective process. ?-In the ED Tmax 100.7 rectally. Of note, pt reports over one week hx of constipation. Moderate sized stool today. Question validity of rectal temp at presentation ?-UA, chest x-ray nonrevealing. ?No leukocytosis ?-Pt has since remained afebrile off abx ?-Blood and urine cx have returned neg ?-5/11 resolved ?  ?Lactic acidosis, unclear cause ?5/6 resolved ?  ?Acute metabolic acidosis of unclear cause ?Given IV fluid hydration ?Reorient as needed ?Fall precautions ?-PT/OT recs for SNF noted.TOC following ?  ?Non anion gap metabolic acidosis ?-Resolved ? ?Type 2 diabetes with hyperglycemia****** ?-5/6 hemoglobin A1c = 11  ?-5/11 Tresiba 12 units daily ?-5/11 sensitive SSI  ?-5/12 Jardiance 25 mg daily ?   ?Parkinson's disease/dementia ?Not on meds per [facility] ?Chart reviewed. Pt has been lost to follow up with Neuorology, was last seen in 2020 where pt was on Requip at that time. Pt had since been lost to follow up, again, requip not on current [facility] ?-Discussed case with Neurology.  ?-Per neurology recommendation Requip 0.'25mg'$   BID ?-Pt scheduled to see Neurology next month, would defer to outpt Neuro to uptitrate dose ?  ?Generalized weakness ?-PT OT with recs for SNF, TOC following ?-5/11 awaiting insurance authorization for SNF.   ?  ?Ileus, resolved ?-Nausea recently with abd xray revealing air-filled prominent loops of colon with findings suggesting colonic ileus ?-Resolved after moderate BM ?-Cont scheduled bowel regimen ?  ? ?Pressure Injury 09/24/21 Sacrum Stage 1 -  Intact skin with non-blanchable redness of a localized area usually over a bony prominence.  (Active)  ?09/24/21 1352  ?Location: Sacrum  ?Location Orientation:   ?Staging: Stage 1 -  Intact skin with non-blanchable redness of a localized area usually over a bony prominence.  ?Wound Description (Comments): -- (foam applied)  ?Present on Admission: Yes  ?Dressing Type None 09/30/21 0714  ? ? ? ?  ? ?Mobility Assessment (last 72 hours)   ? ? Mobility Assessment   ? ? [place] Name 09/30/21 0900 09/30/21 0714 09/29/21 2200 09/29/21 0823 09/28/21 0853  ? Does patient have an order for bedrest or is patient medically unstable -- No - Continue assessment No - Continue assessment No - Continue assessment --  ? What is the highest level of mobility based on  the progressive mobility assessment? Level 4 (Walks with assist in room) - Balance while marching in place and cannot step forward and back - Complete Level 4 (Walks with assist in room) - Balance while marching in place and cannot step forward and back - Complete Level 4 (Walks with assist in room) - Balance while marching in place and cannot step forward and back - Complete Level 4 (Walks with assist in  room) - Balance while marching in place and cannot step forward and back - Complete Level 4 (Walks with assist in room) - Balance while marching in place and cannot step forward and back - Complete  ? ? [place] Name 09/27/21 2050  ?  ?  ?  ?  ? Does patient have an order for bedrest or is patient medically unstable No - Continue assessment      ? What is the highest level of mobility based on the progressive mobility assessment? Level 4 (Walks with assist in room) - Balance while marching in place and cannot step forward and back - Complete      ? ?  ?  ? ?  ? ? ? ?'@IPAL'$ @ ? ?   ?DVT prophylaxis: Lovenox ?Code Status: Full ?Family Communication:  ?Status is: Inpatient ? ? ? ?Dispo: The patient is from: Home ?             Anticipated d/c is to: SNF ?             Anticipated d/c date is: > 3 days ?             Patient currently is not medically stable to d/c. ? ? ? ? ? ?Consultants:  ? ? ?Procedures/Significant Events:  ?5/10 DG RIGHT femur: Negative fracture dislocation ? ? ?I have personally reviewed and interpreted all radiology studies and my findings are as above. ? ?VENTILATOR SETTINGS: ? ? ? ?Cultures ? ? ?Antimicrobials: ?Anti-infectives (From admission, onward)  ? ? Start     Ordered Stop  ? 09/23/21 1700  cefTRIAXone (ROCEPHIN) 2 g in sodium chloride 0.9 % 100 mL IVPB       ? 09/23/21 1655 09/23/21 1751  ? ?  ?  ? ? ?Devices ?  ? ?LINES / TUBES:  ? ? ? ? ?Continuous Infusions: ? ? ?Objective: ?Vitals:  ? 09/29/21 0518 09/29/21 1234 09/29/21 2114 09/30/21 0523  ?BP: (!) 120/59 (!) 124/52 113/67 121/66  ?Pulse: 62 65 63 68  ?Resp: '14 16 18 16  '$ ?Temp: (!) 97.5 ?F (36.4 ?C) 98 ?F (36.7 ?C) 97.8 ?F (36.6 ?C) (!) 97.5 ?F (36.4 ?C)  ?TempSrc: Oral     ?SpO2: 96% 96% 94% 98%  ?Weight:      ?Height:      ? ? ?Intake/Output Summary (Last 24 hours) at 09/30/2021 1004 ?Last data filed at 09/30/2021 1000 ?Gross per 24 hour  ?Intake 840 ml  ?Output 2150 ml  ?Net -1310 ml  ? ? ?Filed Weights  ? 09/23/21 2105  ?Weight: 86.2 kg   ? ? ?Examination: ? ?General: A/O x4, No acute respiratory distress, sitting in chair  ?Eyes: negative scleral hemorrhage, negative anisocoria, negative icterus ?ENT: Negative Runny nose, negative gingival bleeding, ?Neck:  Negative scars, masses, torticollis, lymphadenopathy, JVD ?Lungs: Clear to auscultation bilaterally without wheezes or crackles ?Cardiovascular: Regular rate and rhythm without murmur gallop or rub normal S1 and S2 ?Abdomen: negative abdominal pain, nondistended, positive soft, bowel sounds, no rebound, no ascites, no appreciable mass ?Extremities: negative RIGHT  femur pain to palpation.  Positive muscle spasm RIGHT quadriceps ?Skin: Negative rashes, lesions, ulcers ?Psychiatric:  Negative depression, negative anxiety, negative fatigue, negative mania  ?Central nervous system:  Cranial nerves II through XII intact, tongue/uvula midline, all extremities muscle strength 5/5, sensation intact throughout, negative dysarthria, negative expressive aphasia, negative receptive aphasia. ? ?.  ? ? ? ?Data Reviewed: Care during the described time interval was provided by me .  I have reviewed this patient's available data, including medical history, events of note, physical examination, and all test results as part of my evaluation. ? ?CBC: ?Recent Labs  ?Lab 09/23/21 ?1550 09/24/21 ?4917 09/28/21 ?0422 09/29/21 ?0441 09/30/21 ?0422  ?WBC 5.9 4.1 4.2 4.7 5.1  ?NEUTROABS 4.9 2.7  --  2.2 2.5  ?HGB 15.7 14.4 14.8 15.8 15.5  ?HCT 45.5 40.8 43.2 45.7 45.4  ?MCV 95.2 96.5 93.3 95.0 94.0  ?PLT 171 145* 167 177 204  ? ? ?Basic Metabolic Panel: ?Recent Labs  ?Lab 09/24/21 ?9150 09/25/21 ?0329 09/27/21 ?0428 09/28/21 ?0422 09/29/21 ?5697 09/30/21 ?0422  ?NA  --  136 138 139 137 140  ?K  --  3.7 3.7 4.0 4.3 4.3  ?CL  --  108 106 104 103 105  ?CO2  --  '22 25 27 26 28  '$ ?GLUCOSE  --  199* 178* 193* 197* 131*  ?BUN  --  26* '17 16 18 19  '$ ?CREATININE  --  0.74 0.77 0.87 0.86 0.82  ?CALCIUM  --  7.8* 8.0* 8.4* 8.6* 9.0   ?MG 2.3  --   --   --  2.3 2.1  ?PHOS 2.7  --   --   --  3.6 4.1  ? ? ?GFR: ?Estimated Creatinine Clearance: 70.3 mL/min (by C-G formula based on SCr of 0.82 mg/dL). ?Liver Function Tests: ?Recent Labs  ?Lab 05/07/2

## 2021-09-30 NOTE — Plan of Care (Signed)

## 2021-09-30 NOTE — Progress Notes (Signed)
S: Patient tolerated trial of carbidopa-levodopa today without side effects. No new neurologic complaints. He did not experience any benefit from the carbidopa-levodopa as expected from this subtherapeutic low dose. ? ?O: ? ?Vitals:  ? 09/29/21 2114 09/30/21 0523  ?BP: 113/67 121/66  ?Pulse: 63 68  ?Resp: 18 16  ?Temp: 97.8 ?F (36.6 ?C) (!) 97.5 ?F (36.4 ?C)  ?SpO2: 94% 98%  ? ?General: Laying comfortably in bed; in no acute distress.  ?HENT: Normal oropharynx and mucosa. Normal external appearance of ears and nose.  ?Neck: Supple, no pain or tenderness  ?CV: RRR. No peripheral edema.  ?Ext: No cyanosis, edema, or deformity  ?Skin: No rash. Normal palpation of skin.   ?Musculoskeletal: Normal digits and nails by inspection. No clubbing.  ?  ?Neurologic Examination  ?Mental status/Cognition: Alert, oriented to self, place, month and year, good attention.  ?Speech/language: Fluent, comprehension intact, object naming intact, repetition intact.  ?Cranial nerves:  ? CN II Pupils equal and reactive to light, no VF deficits   ? CN III,IV,VI EOM intact, no gaze preference or deviation, no nystagmus   ? CN V normal sensation in V1, V2, and V3 segments bilaterally   ? CN VII no asymmetry, no nasolabial fold flattening   ? CN VIII normal hearing to speech   ? CN IX & X normal palatal elevation, no uvular deviation   ? CN XI 5/5 head turn and 5/5 shoulder shrug bilaterally   ? CN XII midline tongue protrusion   ?  ?Motor:  ?Muscle bulk: normal, tone normal, pronator drift negative, tremor  upper extremity (L>R) ?Patient has significant cogwheel rigidity on upper and lower extremities. ?Retained 5 out of 5 strength in upper and lower extremities ?  ?Reflexes: ?Normal reflexes in the upper left lower extremities. ?  ?Sensation: ?Grossly intact ?  ?Coordination/Complex Motor:  ?- Finger to Nose intact with tremor ?-Patient has masked facies on gait exam with shuffling gait, truncal instability ?  ?A/P: 87 yo patient longstanding  hx PD (7+ yrs) on requip 0.'25mg'$  bid with severe Parkinsonian features on examination including bradykinesia, resting tremor, incoordination, truncal instability, inability to ambulate with walker without full assist. His Parkinsons is impairing his ability to participate in PT and we have been asked to assist with medication changes. He was briefly tried on carbidopa-levodopa 3 yrs ago and did not tolerate it 2/2 GI side effects. He tolerated very low dose today (0.5 tabs of 25/100 bid) without side effects. He is not expected to have benefit from it right away as this dose is subtherapeutic. Plan to uptitrate to therapeutic dose of 1 tab 25/100 tid over the next 6 weeks as follows: ? ?Carbidopa-levodopa 25/100 tabs ? ?   0800  1300  1800 ?Weeks 1-2:  0.5 tab  0.5 tab  0.5 tab  ?Weeks 3-4:  1 tab  0.5 tab  0.5 tab  ?Weeks 5-6:  1 tab  1 tab  0.5 tab  ?Weeks 7-8:  1 tab  1 tab  1 tab  ? ?- Continue requip 0.'25mg'$  bid ?- It does not appear that patient has been seen by neurology within the past 3 years therefore I will re-refer to [facility] for f/u ?- Neurology to sign off, but please re-engage if additional questions arise ? ?  ?Su Monks, MD ?[facility] ?[phone] ?  ?If 7pm- 7am, please page neurology on call as listed in [place]. ?

## 2021-09-30 NOTE — Discharge Summary (Signed)
Physician Discharge Summary  ?DELSIN COPEN WVP:[id number] DOB: May 25, 1934 DOA: 09/23/2021 ? ?PCP: Haywood Pao, MD ? ?Admit date: 09/23/2021 ?Discharge date: 09/30/2021 ? ?Time spent: 35 minutes ? ?Recommendations for Outpatient Follow-up:  ? ?Covid vaccination; vaccinated 2/3.  5/10 requests booster, ordered.  5/11 per pharmacy boosters no longer available ?  ?Fever, unknown origin ?-Work-up with no evidence of active infective process. ?-In the ED Tmax 100.7 rectally. Of note, pt reports over one week hx of constipation. Moderate sized stool today. Question validity of rectal temp at presentation ?-UA, chest x-ray nonrevealing. ?-No leukocytosis ?-Pt has since remained afebrile off abx ?-Blood and urine cx have returned neg ?-5/11 resolved ?  ?Lactic acidosis, unclear cause ?5/6 resolved ?  ?Acute metabolic acidosis of unclear cause ?-Resolved ?-PT/OT recs for SNF noted.TOC following ?  ?Non anion gap metabolic acidosis ?-Resolved ?  ?Type 2 diabetes with hyperglycemia ?-5/6 hemoglobin A1c = 11  ?-5/11 Tresiba 12 units daily ?-5/12 Jardiance 25 mg daily ?-5/12 restart patient's home medication. ?-Patient's diabetes has been uncontrolled for at least the last 6 months PCP to titrate medication ?  ?Parkinson's disease/dementia ?-On admission not on meds per [facility] ?-Chart reviewed. Pt has been lost to follow up with Neuorology, was last seen in 2020 where pt was on Requip at that time. Pt had since been lost to follow up, again, requip not on current [facility] ?-Discussed case with Neurology.  ?-Per neurology recommendation Requip 0.'25mg'$   BID ?-Pt scheduled to see Neurology next month, would defer to outpt Neuro to uptitrate dose ?  ?Generalized weakness ?-PT OT with recs for SNF, TOC following ?- 5/12 patient has received insurance authorization for SNF.  Safe for discharge.   ?  ?Ileus, resolved ?-Nausea recently with abd xray revealing air-filled prominent loops of colon with findings suggesting colonic  ileus ?-Resolved after moderate BM ?-Cont scheduled bowel regimen ? ? ?Discharge Diagnoses:  ?Principal Problem: ?  Fever ?Active Problems: ?  Parkinson disease ([place]) ?  Pressure injury of skin ?  FUO (fever of unknown origin) ?  Uncontrolled type 2 diabetes mellitus with hyperglycemia ([place]) ?  Dementia without behavioral disturbance ([place]) ?  Generalized weakness ?  Ileus ([place]) ? ? ?Discharge Condition: Stable for discharge ? ?Diet recommendation: Carb modified ? ?Filed Weights  ? 09/23/21 2105  ?Weight: 86.2 kg  ? ? ?History of present illness:  ?87 y.o. WM PMHx dementia, Parkinson's disease, DM Type 2, HLD, chronic anxiety/depression  ?  ?Presented from home to [facility] ED brought in by his wife due to lethargy and fatigue, associated with a subjective fever.  EMS was activated.  Blood pressure by EMS was 80/38 and CBG 432.  Patient was brought into the ED for further evaluation and management of his symptomatology.  In the ED, febrile with Tmax 100.7.  UA negative for pyuria.  No leukocytosis.  COVID-19, influenza A, and influenza B PCR are negative.  Chest x-ray nonacute. ? ?Hospital Course:  ?See above ? ?Procedures: ?5/10 DG RIGHT femur: Negative fracture dislocation ? ?Consultations: ?Neurology ? ?Cultures  ?5/5 blood RFA negative ?5/5 urine negative ? ? ?Antibiotics ?Anti-infectives (From admission, onward)  ? ? Start     Ordered Stop  ? 09/23/21 1700  cefTRIAXone (ROCEPHIN) 2 g in sodium chloride 0.9 % 100 mL IVPB       ? 09/23/21 1655 09/23/21 1751  ? ?  ?  ? ? ?Discharge Exam: ?Vitals:  ? 09/29/21 0518 09/29/21 1234 09/29/21 2114 09/30/21 0523  ?  BP: (!) 120/59 (!) 124/52 113/67 121/66  ?Pulse: 62 65 63 68  ?Resp: '14 16 18 16  '$ ?Temp: (!) 97.5 ?F (36.4 ?C) 98 ?F (36.7 ?C) 97.8 ?F (36.6 ?C) (!) 97.5 ?F (36.4 ?C)  ?TempSrc: Oral     ?SpO2: 96% 96% 94% 98%  ?Weight:      ?Height:      ? ? ?General: A/O x4, No acute respiratory distress, sitting in chair  ?Eyes: negative scleral hemorrhage, negative anisocoria,  negative icterus ?ENT: Negative Runny nose, negative gingival bleeding, ?Neck:  Negative scars, masses, torticollis, lymphadenopathy, JVD ?Lungs: Clear to auscultation bilaterally without wheezes or crackles ?Cardiovascular: Regular rate and rhythm without murmur gallop or rub normal S1 and S2 ? ?Discharge Instructions ? ?Discharge Instructions   ? ? Ambulatory referral to Neurology   Complete by: As directed ?  ? An appointment is requested in approximately: 6 wks  ? ?  ? ?Allergies as of 09/30/2021   ?No Known Allergies ?  ? ?  ?Medication List  ?  ? ?STOP taking these medications   ? ?Fluad Quadrivalent 0.5 ML injection ?Generic drug: influenza vaccine adjuvanted ?  ?LORazepam 0.5 MG tablet ?Commonly known as: ATIVAN ?  ? ?  ? ?TAKE these medications   ? ?Accu-Chek Compact Plus test strip ?Generic drug: glucose blood ?daily. ?  ?Accu-Chek Softclix Lancets lancets ?daily. ?  ?atorvastatin 40 MG tablet ?Commonly known as: LIPITOR ?Take 40 mg by mouth daily. ?  ?carbidopa-levodopa 25-100 MG tablet ?Commonly known as: SINEMET IR ?Take 0.5 tablets by mouth 3 (three) times daily. ?  ?cyclobenzaprine 5 MG tablet ?Commonly known as: FLEXERIL ?Take 1 tablet (5 mg total) by mouth 3 (three) times daily as needed for muscle spasms. ?  ?Jardiance 25 MG Tabs tablet ?Generic drug: empagliflozin ?Take 25 mg by mouth daily. ?  ?Jentadueto 2.5-500 MG Tabs ?Generic drug: linaGLIPtin-metFORMIN HCl ?Take 1 tablet by mouth 2 (two) times daily. ?  ?melatonin 3 MG Tabs tablet ?Take 1 tablet (3 mg total) by mouth at bedtime as needed. ?  ?NovoTwist 32G X 5 MM Misc ?Generic drug: Insulin Pen Needle ?USE ONE PEN NEEDLE WITH TRESIBA INJECTION DAILY ?  ?PARoxetine 20 MG tablet ?Commonly known as: PAXIL ?Take 10 mg by mouth daily. ?  ?polyethylene glycol 17 g packet ?Commonly known as: MIRALAX / GLYCOLAX ?Take 17 g by mouth daily. ?Start taking on: Oct 01, 2021 ?  ?rOPINIRole 0.25 MG tablet ?Commonly known as: REQUIP ?Take 1 tablet (0.25 mg  total) by mouth 2 (two) times daily. ?  ?Tyler Aas FlexTouch 200 UNIT/ML FlexTouch Pen ?Generic drug: insulin degludec ?12 Units daily. ?  ? ?  ? ?No Known Allergies ? Contact information for follow-up providers   ? ? Care, [facility] Follow up.   ?Specialty: Home Health Services ?Why: Alvis Lemmings will contact you to set up the first visit. ?Contact information: ?[address] ?[address] ?[place] [address] ?[phone] ? ? ?  ?  ? ?  ?  ? ? Contact information for after-discharge care   ? ? Destination   ? ? [facility] .   ?Service: Skilled Nursing ?Contact information: ?[address] [place] ?[place] ?[phone] ? ?  ?  ? ?  ?  ? ?  ?  ? ?  ? ? ? ?The results of significant diagnostics from this hospitalization (including imaging, microbiology, ancillary and laboratory) are listed below for reference.   ? ?Significant Diagnostic Studies: ?DG  Abd 1 View ? ?Result Date: 09/25/2021 ?CLINICAL DATA:  Nausea and vomiting EXAM: ABDOMEN - 1 VIEW COMPARISON:  None Available. FINDINGS: Air-filled prominent loops of colon. The ascending colon measures up to 9.8 cm. Fecal loading in the descending colon and rectum. No definite small bowel dilatation. The kidneys are obscured but no renal or ureteral stones are noted. No other acute abnormalities are identified. IMPRESSION: Air-filled prominent loops of colon measuring up to 9.8 cm. No small bowel dilatation noted. The findings most likely represent colonic ileus. An early distal colonic obstruction could have a similar appearance. Recommend clinical correlation and attention on follow-up. Fecal loading in the descending colon. Electronically Signed   By: Dorise Bullion III M.D.   On: 09/25/2021 10:21  ? ?DG Chest Port 1 View ? ?Result Date: 09/23/2021 ?CLINICAL DATA:  Fatigue, lethargy, and fever. Hypotension. Hyperglycemia. Falls today. EXAM: PORTABLE CHEST 1 VIEW COMPARISON:  06/10/2014 FINDINGS: Subsegmental atelectasis or scarring  at the left lung base. The lungs appear otherwise clear. Heart size within normal limits. Mild thoracic spondylosis. IMPRESSION: 1. Linear subsegmental atelectasis or scarring at the left lung base. 2. Mild thoracic spon

## 2021-09-30 NOTE — Progress Notes (Signed)
Inpatient Diabetes Program Recommendations ? ?AACE/ADA: New Consensus Statement on Inpatient Glycemic Control (2015) ? ?Target Ranges:  Prepandial:   less than 140 mg/dL ?     Peak postprandial:   less than 180 mg/dL (1-2 hours) ?     Critically ill patients:  140 - 180 mg/dL  ? ?Lab Results  ?Component Value Date  ? GLUCAP 129 (H) 09/30/2021  ? HGBA1C 11.0 (H) 09/24/2021  ? ? ?Review of Glycemic Control ? Latest Reference Range & Units 09/29/21 07:15 09/29/21 11:34 09/29/21 16:22 09/29/21 21:20 09/30/21 07:32  ?Glucose-Capillary 70 - 99 mg/dL 199 (H) 395 (H) 244 (H) 228 (H) 129 (H)  ?(H): Data is abnormally high ? ?  ?Diabetes history: DM2 ?Outpatient Diabetes medications: Jardiance 25 mg QD, Jentadueto 2.5-500 mg BID, Tresiba 12 units QD ?Current orders for Inpatient glycemic control: Semglee 12 units QD, Novolog 0-9 units TID and 0-5 qhs ? ?Inpatient Diabetes Program Recommendations:   ? ?Postprandials consistently elevated.  Please consider: ? ?Novolog 3 units TID with meals IF consumes at least 50% ? ?Will continue to follow while inpatient. ? ?Thank you, ?Reche Dixon, MSN, CDCES ?Diabetes Coordinator ?Inpatient Diabetes Program ?[phone] (team pager from 8a-5p) ? ? ? ? ?

## 2021-09-30 NOTE — Progress Notes (Signed)
Occupational Therapy Treatment ?Patient Details ?Name: Matthew [name] ?MRN: [id number] ?DOB: 09-04-1934 ?Today's Date: 09/30/2021 ? ? ?History of present illness Presented from home to [facility] ED brought in by his wife due to lethargy and fatigue, associated with a subjective fever.  EMS was activated.  Blood pressure by EMS was 80/38 and CBG 432.  Patient was brought into the ED for further evaluation and management of his symptomatology.  In the ED, febrile with Tmax 100.7.  UA negative for pyuria.  No leukocytosis.  COVID-19, influenza A, and influenza B PCR are negative.  Chest x-ray nonacute. Neurology now on board addressing Parkinson's symtoms. ?  ?OT comments ? Patient very pleasant and cooperative throughout session. Does need verbal cues to redirect when ambulating towards bathroom, patient heading towards hallway door. Overall min A ambulating with cues to increase step length. Patient incontinent of urine and stool onto floor. Was able to wash lower legs while seated on toilet and needing total A to doff soiled socks and don clean pair. Due to needing bilateral hand support on walker in standing at toilet patient total A for perianal care. Patient progressing towards goals with continue to follow.   ? ?Recommendations for follow up therapy are one component of a multi-disciplinary discharge planning process, led by the attending physician.  Recommendations may be updated based on patient status, additional functional criteria and insurance authorization. ?   ?Follow Up Recommendations ? Skilled nursing-short term rehab (<3 hours/day)  ?  ?Assistance Recommended at Discharge Frequent or constant Supervision/Assistance  ?Patient can return home with the following ? A little help with walking and/or transfers;A lot of help with bathing/dressing/bathroom;Assistance with cooking/housework;Direct supervision/assist for financial management;Direct supervision/assist for medications management;Help with stairs or  ramp for entrance ?  ?Equipment Recommendations ? None recommended by OT  ?  ?   ?Precautions / Restrictions Precautions ?Precautions: Fall ?Precaution Comments: several falls per patient, Hx Parkinson's ?Restrictions ?Weight Bearing Restrictions: No  ? ? ?  ? ?Mobility Bed Mobility ?Overal bed mobility: Needs Assistance ?Bed Mobility: Supine to Sit ?  ?  ?Supine to sit: HOB elevated, Min guard ?  ?  ?General bed mobility comments: Patient able to bring his legs to edge of bed, min G for trunk ?  ? ? ?  ?Balance Overall balance assessment: Needs assistance, History of Falls ?Sitting-balance support: Feet supported ?Sitting balance-Leahy Scale: Fair ?  ?  ?Standing balance support: Single extremity supported, During functional activity ?Standing balance-Leahy Scale: Poor ?  ?  ?  ?  ?  ?  ?  ?  ?  ?  ?  ?  ?   ? ?ADL either performed or assessed with clinical judgement  ? ?ADL Overall ADL's : Needs assistance/impaired ?  ?  ?Grooming: Min guard;Wash/dry hands;Standing ?Grooming Details (indicate cue type and reason): Patient stood at sink, no posterior lean noted min G for safety ?  ?  ?Lower Body Bathing: Set up;Sitting/lateral leans ?Lower Body Bathing Details (indicate cue type and reason): Patient seated on toilet, able to wash lower legs of stool 2* incontinence ?  ?  ?Lower Body Dressing: Maximal assistance;Sitting/lateral leans ?Lower Body Dressing Details (indicate cue type and reason): Patient was able to pull up socks seated at edge of bed, however once soiled and sitting on toilet patient needing assist to doff and don clean pair ?Toilet Transfer: Minimal assistance;Ambulation;Grab bars;Rolling walker (2 wheels);Cueing for safety;Cueing for sequencing ?Toilet Transfer Details (indicate cue type and reason): Patient needing verbal  cues to take big steps while ambulating to/from bathroom. Increased time to power up to standing and to use grab bar vs pulling on walker ?Toileting- Clothing Manipulation and  Hygiene: Total assistance;Sit to/from stand ?Toileting - Clothing Manipulation Details (indicate cue type and reason): Patient incontinent of stool needing total A 2* balance to perform perianal care. ?  ?  ?Functional mobility during ADLs: Minimal assistance;Rolling walker (2 wheels);Cueing for safety;Cueing for sequencing ?  ?  ? ? ? ?Cognition Arousal/Alertness: Awake/alert ?Behavior During Therapy: [facility] for tasks assessed/performed ?Overall Cognitive Status: No family/caregiver present to determine baseline cognitive functioning ?Area of Impairment: Following commands ?  ?  ?  ?  ?  ?  ?  ?  ?  ?  ?  ?Following Commands: Follows one step commands inconsistently ?  ?  ?  ?General Comments: Patient very pleasant and cooperative. Needing repeated cues to turn walker towards bathroom door vs hallway door ?  ?  ?   ?   ?   ?   ? ? ?Pertinent Vitals/ Pain       Pain Assessment ?Pain Assessment: No/denies pain ? ?   ?   ? ?Frequency ? Min 2X/week  ? ? ? ? ?  ?Progress Toward Goals ? ?OT Goals(current goals can now be found in the care plan section) ? Progress towards OT goals: Progressing toward goals ? ?Acute Rehab OT Goals ?Patient Stated Goal: "I know I've gotten weaker" ?OT Goal Formulation: With patient ?Time For Goal Achievement: 10/08/21 ?Potential to Achieve Goals: Good ?ADL Goals ?Pt Will Transfer to Toilet: with supervision;regular height toilet;grab bars;ambulating ?Pt Will Perform Toileting - Clothing Manipulation and hygiene: with supervision;sit to/from stand ?Additional ADL Goal #1: Patient will stand at sink to perform grooming task as evidence of improving activity tolerance ?Additional ADL Goal #2: Patient will pull clothing from knees over hips as evidence of improving standing balance and functional abilities  ?Plan Discharge plan remains appropriate   ? ?   ?AM-PAC OT "6 Clicks" Daily Activity     ?Outcome Measure ? ? Help from another person eating meals?: A Little ?Help from another person taking  care of personal grooming?: A Little ?Help from another person toileting, which includes using toliet, bedpan, or urinal?: A Lot ?Help from another person bathing (including washing, rinsing, drying)?: A Little ?Help from another person to put on and taking off regular upper body clothing?: A Little ?Help from another person to put on and taking off regular lower body clothing?: A Lot ?6 Click Score: 16 ? ?  ?End of Session Equipment Utilized During Treatment: Rolling walker (2 wheels);Gait belt ? ?OT Visit Diagnosis: Unsteadiness on feet (R26.81);Other abnormalities of gait and mobility (R26.89);Other symptoms and signs involving cognitive function ?  ?Activity Tolerance Patient tolerated treatment well ?  ?Patient Left in chair;with call bell/phone within reach;with chair alarm set ?  ?Nurse Communication Mobility status ?  ? ?   ? ?Time: 4403-4742 ?OT Time Calculation (min): 23 min ? ?Charges: OT General Charges ?$OT Visit: 1 Visit ?OT Treatments ?$Self Care/Home Management : 23-37 mins ? ?Delbert Phenix OT ?OT pager: [phone] ? ? ?Rosemary Holms ?09/30/2021, 9:26 AM ?

## 2021-10-04 DIAGNOSIS — E1165 Type 2 diabetes mellitus with hyperglycemia: Secondary | ICD-10-CM | POA: Diagnosis not present

## 2021-10-04 DIAGNOSIS — R5381 Other malaise: Secondary | ICD-10-CM | POA: Diagnosis not present

## 2021-10-04 DIAGNOSIS — R509 Fever, unspecified: Secondary | ICD-10-CM | POA: Diagnosis not present

## 2021-10-04 DIAGNOSIS — Z794 Long term (current) use of insulin: Secondary | ICD-10-CM | POA: Diagnosis not present

## 2021-10-04 DIAGNOSIS — F39 Unspecified mood [affective] disorder: Secondary | ICD-10-CM | POA: Diagnosis not present

## 2021-10-04 DIAGNOSIS — G20 Parkinson's disease: Secondary | ICD-10-CM | POA: Diagnosis not present

## 2021-10-04 DIAGNOSIS — Z8719 Personal history of other diseases of the digestive system: Secondary | ICD-10-CM | POA: Diagnosis not present

## 2021-10-05 DIAGNOSIS — F0390 Unspecified dementia without behavioral disturbance: Secondary | ICD-10-CM | POA: Diagnosis not present

## 2021-10-05 DIAGNOSIS — R509 Fever, unspecified: Secondary | ICD-10-CM | POA: Diagnosis not present

## 2021-10-05 DIAGNOSIS — M6281 Muscle weakness (generalized): Secondary | ICD-10-CM | POA: Diagnosis not present

## 2021-10-05 DIAGNOSIS — K567 Ileus, unspecified: Secondary | ICD-10-CM | POA: Diagnosis not present

## 2021-10-05 DIAGNOSIS — G4733 Obstructive sleep apnea (adult) (pediatric): Secondary | ICD-10-CM | POA: Diagnosis not present

## 2021-10-05 DIAGNOSIS — E1165 Type 2 diabetes mellitus with hyperglycemia: Secondary | ICD-10-CM | POA: Diagnosis not present

## 2021-10-05 DIAGNOSIS — R2681 Unsteadiness on feet: Secondary | ICD-10-CM | POA: Diagnosis not present

## 2021-10-05 DIAGNOSIS — G20 Parkinson's disease: Secondary | ICD-10-CM | POA: Diagnosis not present

## 2021-10-06 DIAGNOSIS — R2681 Unsteadiness on feet: Secondary | ICD-10-CM | POA: Diagnosis not present

## 2021-10-06 DIAGNOSIS — G20 Parkinson's disease: Secondary | ICD-10-CM | POA: Diagnosis not present

## 2021-10-06 DIAGNOSIS — G4733 Obstructive sleep apnea (adult) (pediatric): Secondary | ICD-10-CM | POA: Diagnosis not present

## 2021-10-06 DIAGNOSIS — E1165 Type 2 diabetes mellitus with hyperglycemia: Secondary | ICD-10-CM | POA: Diagnosis not present

## 2021-10-06 DIAGNOSIS — R509 Fever, unspecified: Secondary | ICD-10-CM | POA: Diagnosis not present

## 2021-10-06 DIAGNOSIS — M6281 Muscle weakness (generalized): Secondary | ICD-10-CM | POA: Diagnosis not present

## 2021-10-06 DIAGNOSIS — K567 Ileus, unspecified: Secondary | ICD-10-CM | POA: Diagnosis not present

## 2021-10-06 DIAGNOSIS — F0390 Unspecified dementia without behavioral disturbance: Secondary | ICD-10-CM | POA: Diagnosis not present

## 2021-10-10 DIAGNOSIS — K567 Ileus, unspecified: Secondary | ICD-10-CM | POA: Diagnosis not present

## 2021-10-10 DIAGNOSIS — G20 Parkinson's disease: Secondary | ICD-10-CM | POA: Diagnosis not present

## 2021-10-10 DIAGNOSIS — F0390 Unspecified dementia without behavioral disturbance: Secondary | ICD-10-CM | POA: Diagnosis not present

## 2021-10-10 DIAGNOSIS — R509 Fever, unspecified: Secondary | ICD-10-CM | POA: Diagnosis not present

## 2021-10-10 DIAGNOSIS — R2681 Unsteadiness on feet: Secondary | ICD-10-CM | POA: Diagnosis not present

## 2021-10-10 DIAGNOSIS — M6281 Muscle weakness (generalized): Secondary | ICD-10-CM | POA: Diagnosis not present

## 2021-10-10 DIAGNOSIS — E1165 Type 2 diabetes mellitus with hyperglycemia: Secondary | ICD-10-CM | POA: Diagnosis not present

## 2021-10-10 DIAGNOSIS — G4733 Obstructive sleep apnea (adult) (pediatric): Secondary | ICD-10-CM | POA: Diagnosis not present

## 2021-10-11 DIAGNOSIS — G20 Parkinson's disease: Secondary | ICD-10-CM | POA: Diagnosis not present

## 2021-10-11 DIAGNOSIS — F32A Depression, unspecified: Secondary | ICD-10-CM | POA: Diagnosis not present

## 2021-10-11 DIAGNOSIS — R509 Fever, unspecified: Secondary | ICD-10-CM | POA: Diagnosis not present

## 2021-10-11 DIAGNOSIS — E1165 Type 2 diabetes mellitus with hyperglycemia: Secondary | ICD-10-CM | POA: Diagnosis not present

## 2021-10-11 DIAGNOSIS — Z8719 Personal history of other diseases of the digestive system: Secondary | ICD-10-CM | POA: Diagnosis not present

## 2021-10-12 DIAGNOSIS — E1165 Type 2 diabetes mellitus with hyperglycemia: Secondary | ICD-10-CM | POA: Diagnosis not present

## 2021-10-12 DIAGNOSIS — K567 Ileus, unspecified: Secondary | ICD-10-CM | POA: Diagnosis not present

## 2021-10-12 DIAGNOSIS — R509 Fever, unspecified: Secondary | ICD-10-CM | POA: Diagnosis not present

## 2021-10-12 DIAGNOSIS — G20 Parkinson's disease: Secondary | ICD-10-CM | POA: Diagnosis not present

## 2021-10-12 DIAGNOSIS — G4733 Obstructive sleep apnea (adult) (pediatric): Secondary | ICD-10-CM | POA: Diagnosis not present

## 2021-10-12 DIAGNOSIS — R2681 Unsteadiness on feet: Secondary | ICD-10-CM | POA: Diagnosis not present

## 2021-10-12 DIAGNOSIS — F0390 Unspecified dementia without behavioral disturbance: Secondary | ICD-10-CM | POA: Diagnosis not present

## 2021-10-12 DIAGNOSIS — M6281 Muscle weakness (generalized): Secondary | ICD-10-CM | POA: Diagnosis not present

## 2021-10-13 DIAGNOSIS — R2681 Unsteadiness on feet: Secondary | ICD-10-CM | POA: Diagnosis not present

## 2021-10-13 DIAGNOSIS — E1165 Type 2 diabetes mellitus with hyperglycemia: Secondary | ICD-10-CM | POA: Diagnosis not present

## 2021-10-13 DIAGNOSIS — G4733 Obstructive sleep apnea (adult) (pediatric): Secondary | ICD-10-CM | POA: Diagnosis not present

## 2021-10-13 DIAGNOSIS — L309 Dermatitis, unspecified: Secondary | ICD-10-CM | POA: Diagnosis not present

## 2021-10-13 DIAGNOSIS — F0390 Unspecified dementia without behavioral disturbance: Secondary | ICD-10-CM | POA: Diagnosis not present

## 2021-10-13 DIAGNOSIS — M6281 Muscle weakness (generalized): Secondary | ICD-10-CM | POA: Diagnosis not present

## 2021-10-13 DIAGNOSIS — R509 Fever, unspecified: Secondary | ICD-10-CM | POA: Diagnosis not present

## 2021-10-13 DIAGNOSIS — K567 Ileus, unspecified: Secondary | ICD-10-CM | POA: Diagnosis not present

## 2021-10-13 DIAGNOSIS — G20 Parkinson's disease: Secondary | ICD-10-CM | POA: Diagnosis not present

## 2021-10-14 DIAGNOSIS — L304 Erythema intertrigo: Secondary | ICD-10-CM | POA: Diagnosis not present

## 2021-10-14 DIAGNOSIS — E1165 Type 2 diabetes mellitus with hyperglycemia: Secondary | ICD-10-CM | POA: Diagnosis not present

## 2021-10-18 DIAGNOSIS — R509 Fever, unspecified: Secondary | ICD-10-CM | POA: Diagnosis not present

## 2021-10-18 DIAGNOSIS — G20 Parkinson's disease: Secondary | ICD-10-CM | POA: Diagnosis not present

## 2021-10-18 DIAGNOSIS — K567 Ileus, unspecified: Secondary | ICD-10-CM | POA: Diagnosis not present

## 2021-10-18 DIAGNOSIS — R2681 Unsteadiness on feet: Secondary | ICD-10-CM | POA: Diagnosis not present

## 2021-10-18 DIAGNOSIS — F0390 Unspecified dementia without behavioral disturbance: Secondary | ICD-10-CM | POA: Diagnosis not present

## 2021-10-18 DIAGNOSIS — M6281 Muscle weakness (generalized): Secondary | ICD-10-CM | POA: Diagnosis not present

## 2021-10-18 DIAGNOSIS — L309 Dermatitis, unspecified: Secondary | ICD-10-CM | POA: Diagnosis not present

## 2021-10-18 DIAGNOSIS — G4733 Obstructive sleep apnea (adult) (pediatric): Secondary | ICD-10-CM | POA: Diagnosis not present

## 2021-10-18 DIAGNOSIS — E1165 Type 2 diabetes mellitus with hyperglycemia: Secondary | ICD-10-CM | POA: Diagnosis not present

## 2021-10-20 DIAGNOSIS — M6281 Muscle weakness (generalized): Secondary | ICD-10-CM | POA: Diagnosis not present

## 2021-10-20 DIAGNOSIS — F0390 Unspecified dementia without behavioral disturbance: Secondary | ICD-10-CM | POA: Diagnosis not present

## 2021-10-20 DIAGNOSIS — E1165 Type 2 diabetes mellitus with hyperglycemia: Secondary | ICD-10-CM | POA: Diagnosis not present

## 2021-10-20 DIAGNOSIS — G20 Parkinson's disease: Secondary | ICD-10-CM | POA: Diagnosis not present

## 2021-10-20 DIAGNOSIS — K567 Ileus, unspecified: Secondary | ICD-10-CM | POA: Diagnosis not present

## 2021-10-20 DIAGNOSIS — R509 Fever, unspecified: Secondary | ICD-10-CM | POA: Diagnosis not present

## 2021-10-20 DIAGNOSIS — G4733 Obstructive sleep apnea (adult) (pediatric): Secondary | ICD-10-CM | POA: Diagnosis not present

## 2021-10-20 DIAGNOSIS — L309 Dermatitis, unspecified: Secondary | ICD-10-CM | POA: Diagnosis not present

## 2021-10-20 DIAGNOSIS — R2681 Unsteadiness on feet: Secondary | ICD-10-CM | POA: Diagnosis not present

## 2021-10-21 DIAGNOSIS — M6281 Muscle weakness (generalized): Secondary | ICD-10-CM | POA: Diagnosis not present

## 2021-10-21 DIAGNOSIS — K567 Ileus, unspecified: Secondary | ICD-10-CM | POA: Diagnosis not present

## 2021-10-21 DIAGNOSIS — G20 Parkinson's disease: Secondary | ICD-10-CM | POA: Diagnosis not present

## 2021-10-21 DIAGNOSIS — F0390 Unspecified dementia without behavioral disturbance: Secondary | ICD-10-CM | POA: Diagnosis not present

## 2021-10-21 DIAGNOSIS — L309 Dermatitis, unspecified: Secondary | ICD-10-CM | POA: Diagnosis not present

## 2021-10-21 DIAGNOSIS — R2681 Unsteadiness on feet: Secondary | ICD-10-CM | POA: Diagnosis not present

## 2021-10-21 DIAGNOSIS — G4733 Obstructive sleep apnea (adult) (pediatric): Secondary | ICD-10-CM | POA: Diagnosis not present

## 2021-10-21 DIAGNOSIS — R509 Fever, unspecified: Secondary | ICD-10-CM | POA: Diagnosis not present

## 2021-10-21 DIAGNOSIS — E1165 Type 2 diabetes mellitus with hyperglycemia: Secondary | ICD-10-CM | POA: Diagnosis not present

## 2021-10-25 DIAGNOSIS — M6281 Muscle weakness (generalized): Secondary | ICD-10-CM | POA: Diagnosis not present

## 2021-10-25 DIAGNOSIS — K567 Ileus, unspecified: Secondary | ICD-10-CM | POA: Diagnosis not present

## 2021-10-25 DIAGNOSIS — L309 Dermatitis, unspecified: Secondary | ICD-10-CM | POA: Diagnosis not present

## 2021-10-25 DIAGNOSIS — R509 Fever, unspecified: Secondary | ICD-10-CM | POA: Diagnosis not present

## 2021-10-25 DIAGNOSIS — F0390 Unspecified dementia without behavioral disturbance: Secondary | ICD-10-CM | POA: Diagnosis not present

## 2021-10-25 DIAGNOSIS — E1165 Type 2 diabetes mellitus with hyperglycemia: Secondary | ICD-10-CM | POA: Diagnosis not present

## 2021-10-25 DIAGNOSIS — G20 Parkinson's disease: Secondary | ICD-10-CM | POA: Diagnosis not present

## 2021-10-25 DIAGNOSIS — R2681 Unsteadiness on feet: Secondary | ICD-10-CM | POA: Diagnosis not present

## 2021-10-25 DIAGNOSIS — G4733 Obstructive sleep apnea (adult) (pediatric): Secondary | ICD-10-CM | POA: Diagnosis not present

## 2021-10-27 DIAGNOSIS — G20 Parkinson's disease: Secondary | ICD-10-CM | POA: Diagnosis not present

## 2021-10-27 DIAGNOSIS — F32A Depression, unspecified: Secondary | ICD-10-CM | POA: Diagnosis not present

## 2021-10-27 DIAGNOSIS — E1165 Type 2 diabetes mellitus with hyperglycemia: Secondary | ICD-10-CM | POA: Diagnosis not present

## 2021-10-27 DIAGNOSIS — R5381 Other malaise: Secondary | ICD-10-CM | POA: Diagnosis not present

## 2021-10-27 DIAGNOSIS — Z8719 Personal history of other diseases of the digestive system: Secondary | ICD-10-CM | POA: Diagnosis not present

## 2021-10-29 DIAGNOSIS — R509 Fever, unspecified: Secondary | ICD-10-CM | POA: Diagnosis not present

## 2021-10-29 DIAGNOSIS — E1165 Type 2 diabetes mellitus with hyperglycemia: Secondary | ICD-10-CM | POA: Diagnosis not present

## 2021-10-29 DIAGNOSIS — F0390 Unspecified dementia without behavioral disturbance: Secondary | ICD-10-CM | POA: Diagnosis not present

## 2021-10-29 DIAGNOSIS — R2681 Unsteadiness on feet: Secondary | ICD-10-CM | POA: Diagnosis not present

## 2021-10-29 DIAGNOSIS — L309 Dermatitis, unspecified: Secondary | ICD-10-CM | POA: Diagnosis not present

## 2021-10-29 DIAGNOSIS — K567 Ileus, unspecified: Secondary | ICD-10-CM | POA: Diagnosis not present

## 2021-10-29 DIAGNOSIS — G4733 Obstructive sleep apnea (adult) (pediatric): Secondary | ICD-10-CM | POA: Diagnosis not present

## 2021-10-29 DIAGNOSIS — M6281 Muscle weakness (generalized): Secondary | ICD-10-CM | POA: Diagnosis not present

## 2021-10-29 DIAGNOSIS — G20 Parkinson's disease: Secondary | ICD-10-CM | POA: Diagnosis not present

## 2021-11-10 DIAGNOSIS — K5909 Other constipation: Secondary | ICD-10-CM | POA: Diagnosis not present

## 2021-11-10 DIAGNOSIS — E782 Mixed hyperlipidemia: Secondary | ICD-10-CM | POA: Diagnosis not present

## 2021-11-10 DIAGNOSIS — M1991 Primary osteoarthritis, unspecified site: Secondary | ICD-10-CM | POA: Diagnosis not present

## 2021-11-10 DIAGNOSIS — F5101 Primary insomnia: Secondary | ICD-10-CM | POA: Diagnosis not present

## 2021-11-10 DIAGNOSIS — F0390 Unspecified dementia without behavioral disturbance: Secondary | ICD-10-CM | POA: Diagnosis not present

## 2021-11-10 DIAGNOSIS — G20 Parkinson's disease: Secondary | ICD-10-CM | POA: Diagnosis not present

## 2021-11-10 DIAGNOSIS — F330 Major depressive disorder, recurrent, mild: Secondary | ICD-10-CM | POA: Diagnosis not present

## 2021-11-10 DIAGNOSIS — E1169 Type 2 diabetes mellitus with other specified complication: Secondary | ICD-10-CM | POA: Diagnosis not present

## 2021-11-10 DIAGNOSIS — G4733 Obstructive sleep apnea (adult) (pediatric): Secondary | ICD-10-CM | POA: Diagnosis not present

## 2021-11-14 DIAGNOSIS — M6281 Muscle weakness (generalized): Secondary | ICD-10-CM | POA: Diagnosis not present

## 2021-11-15 DIAGNOSIS — M6281 Muscle weakness (generalized): Secondary | ICD-10-CM | POA: Diagnosis not present

## 2021-11-16 ENCOUNTER — Ambulatory Visit: Payer: Medicare HMO | Admitting: Neurology

## 2021-11-17 DIAGNOSIS — M6281 Muscle weakness (generalized): Secondary | ICD-10-CM | POA: Diagnosis not present

## 2021-11-17 DIAGNOSIS — R2689 Other abnormalities of gait and mobility: Secondary | ICD-10-CM | POA: Diagnosis not present

## 2021-11-18 DIAGNOSIS — M6281 Muscle weakness (generalized): Secondary | ICD-10-CM | POA: Diagnosis not present

## 2021-11-21 DIAGNOSIS — R2689 Other abnormalities of gait and mobility: Secondary | ICD-10-CM | POA: Diagnosis not present

## 2021-11-21 DIAGNOSIS — M6281 Muscle weakness (generalized): Secondary | ICD-10-CM | POA: Diagnosis not present

## 2021-11-23 DIAGNOSIS — R41841 Cognitive communication deficit: Secondary | ICD-10-CM | POA: Diagnosis not present

## 2021-11-24 ENCOUNTER — Ambulatory Visit: Payer: Medicare HMO | Admitting: Neurology

## 2021-11-24 DIAGNOSIS — R41841 Cognitive communication deficit: Secondary | ICD-10-CM | POA: Diagnosis not present

## 2021-11-24 DIAGNOSIS — M6281 Muscle weakness (generalized): Secondary | ICD-10-CM | POA: Diagnosis not present

## 2021-11-24 DIAGNOSIS — R2689 Other abnormalities of gait and mobility: Secondary | ICD-10-CM | POA: Diagnosis not present

## 2021-11-25 DIAGNOSIS — M6281 Muscle weakness (generalized): Secondary | ICD-10-CM | POA: Diagnosis not present

## 2021-11-25 DIAGNOSIS — R2689 Other abnormalities of gait and mobility: Secondary | ICD-10-CM | POA: Diagnosis not present

## 2021-11-28 DIAGNOSIS — R2689 Other abnormalities of gait and mobility: Secondary | ICD-10-CM | POA: Diagnosis not present

## 2021-11-28 DIAGNOSIS — M6281 Muscle weakness (generalized): Secondary | ICD-10-CM | POA: Diagnosis not present

## 2021-11-29 ENCOUNTER — Ambulatory Visit: Payer: Medicare HMO | Admitting: Neurology

## 2021-11-29 DIAGNOSIS — R41841 Cognitive communication deficit: Secondary | ICD-10-CM | POA: Diagnosis not present

## 2021-11-29 DIAGNOSIS — M6281 Muscle weakness (generalized): Secondary | ICD-10-CM | POA: Diagnosis not present

## 2021-11-30 DIAGNOSIS — R2689 Other abnormalities of gait and mobility: Secondary | ICD-10-CM | POA: Diagnosis not present

## 2021-11-30 DIAGNOSIS — M6281 Muscle weakness (generalized): Secondary | ICD-10-CM | POA: Diagnosis not present

## 2021-12-01 DIAGNOSIS — R2689 Other abnormalities of gait and mobility: Secondary | ICD-10-CM | POA: Diagnosis not present

## 2021-12-01 DIAGNOSIS — M6281 Muscle weakness (generalized): Secondary | ICD-10-CM | POA: Diagnosis not present

## 2021-12-01 DIAGNOSIS — R41841 Cognitive communication deficit: Secondary | ICD-10-CM | POA: Diagnosis not present

## 2021-12-02 DIAGNOSIS — G20 Parkinson's disease: Secondary | ICD-10-CM | POA: Diagnosis not present

## 2021-12-02 DIAGNOSIS — N179 Acute kidney failure, unspecified: Secondary | ICD-10-CM | POA: Diagnosis not present

## 2021-12-02 DIAGNOSIS — M6281 Muscle weakness (generalized): Secondary | ICD-10-CM | POA: Diagnosis not present

## 2021-12-02 DIAGNOSIS — R0902 Hypoxemia: Secondary | ICD-10-CM | POA: Diagnosis not present

## 2021-12-02 DIAGNOSIS — R1110 Vomiting, unspecified: Secondary | ICD-10-CM | POA: Diagnosis not present

## 2021-12-02 DIAGNOSIS — L8995 Pressure ulcer of unspecified site, unstageable: Secondary | ICD-10-CM | POA: Diagnosis not present

## 2021-12-02 DIAGNOSIS — R739 Hyperglycemia, unspecified: Secondary | ICD-10-CM | POA: Diagnosis not present

## 2021-12-02 DIAGNOSIS — R531 Weakness: Secondary | ICD-10-CM | POA: Diagnosis not present

## 2021-12-02 DIAGNOSIS — F32A Depression, unspecified: Secondary | ICD-10-CM | POA: Diagnosis not present

## 2021-12-02 DIAGNOSIS — F02A0 Dementia in other diseases classified elsewhere, mild, without behavioral disturbance, psychotic disturbance, mood disturbance, and anxiety: Secondary | ICD-10-CM | POA: Diagnosis not present

## 2021-12-02 DIAGNOSIS — R9431 Abnormal electrocardiogram [ECG] [EKG]: Secondary | ICD-10-CM | POA: Diagnosis not present

## 2021-12-02 DIAGNOSIS — J189 Pneumonia, unspecified organism: Secondary | ICD-10-CM | POA: Diagnosis not present

## 2021-12-02 DIAGNOSIS — E1110 Type 2 diabetes mellitus with ketoacidosis without coma: Secondary | ICD-10-CM | POA: Diagnosis not present

## 2021-12-02 DIAGNOSIS — E861 Hypovolemia: Secondary | ICD-10-CM | POA: Diagnosis not present

## 2021-12-02 DIAGNOSIS — R1111 Vomiting without nausea: Secondary | ICD-10-CM | POA: Diagnosis not present

## 2021-12-02 DIAGNOSIS — T383X5A Adverse effect of insulin and oral hypoglycemic [antidiabetic] drugs, initial encounter: Secondary | ICD-10-CM | POA: Diagnosis not present

## 2021-12-02 DIAGNOSIS — E785 Hyperlipidemia, unspecified: Secondary | ICD-10-CM | POA: Diagnosis not present

## 2021-12-02 DIAGNOSIS — F0280 Dementia in other diseases classified elsewhere without behavioral disturbance: Secondary | ICD-10-CM | POA: Diagnosis not present

## 2021-12-02 DIAGNOSIS — Z794 Long term (current) use of insulin: Secondary | ICD-10-CM | POA: Diagnosis not present

## 2021-12-02 DIAGNOSIS — Z79899 Other long term (current) drug therapy: Secondary | ICD-10-CM | POA: Diagnosis not present

## 2021-12-07 DIAGNOSIS — M6281 Muscle weakness (generalized): Secondary | ICD-10-CM | POA: Diagnosis not present

## 2021-12-07 DIAGNOSIS — R41841 Cognitive communication deficit: Secondary | ICD-10-CM | POA: Diagnosis not present

## 2021-12-08 DIAGNOSIS — M6281 Muscle weakness (generalized): Secondary | ICD-10-CM | POA: Diagnosis not present

## 2021-12-09 DIAGNOSIS — E782 Mixed hyperlipidemia: Secondary | ICD-10-CM | POA: Diagnosis not present

## 2021-12-09 DIAGNOSIS — G20 Parkinson's disease: Secondary | ICD-10-CM | POA: Diagnosis not present

## 2021-12-09 DIAGNOSIS — E1169 Type 2 diabetes mellitus with other specified complication: Secondary | ICD-10-CM | POA: Diagnosis not present

## 2021-12-12 DIAGNOSIS — R2689 Other abnormalities of gait and mobility: Secondary | ICD-10-CM | POA: Diagnosis not present

## 2021-12-12 DIAGNOSIS — R41841 Cognitive communication deficit: Secondary | ICD-10-CM | POA: Diagnosis not present

## 2021-12-12 DIAGNOSIS — M6281 Muscle weakness (generalized): Secondary | ICD-10-CM | POA: Diagnosis not present

## 2021-12-13 DIAGNOSIS — R2689 Other abnormalities of gait and mobility: Secondary | ICD-10-CM | POA: Diagnosis not present

## 2021-12-13 DIAGNOSIS — M6281 Muscle weakness (generalized): Secondary | ICD-10-CM | POA: Diagnosis not present

## 2021-12-14 DIAGNOSIS — M6281 Muscle weakness (generalized): Secondary | ICD-10-CM | POA: Diagnosis not present

## 2021-12-14 DIAGNOSIS — R41841 Cognitive communication deficit: Secondary | ICD-10-CM | POA: Diagnosis not present

## 2021-12-15 DIAGNOSIS — M6281 Muscle weakness (generalized): Secondary | ICD-10-CM | POA: Diagnosis not present

## 2021-12-15 DIAGNOSIS — R2689 Other abnormalities of gait and mobility: Secondary | ICD-10-CM | POA: Diagnosis not present

## 2021-12-16 DIAGNOSIS — R2689 Other abnormalities of gait and mobility: Secondary | ICD-10-CM | POA: Diagnosis not present

## 2021-12-16 DIAGNOSIS — M6281 Muscle weakness (generalized): Secondary | ICD-10-CM | POA: Diagnosis not present

## 2021-12-19 DIAGNOSIS — M6281 Muscle weakness (generalized): Secondary | ICD-10-CM | POA: Diagnosis not present

## 2021-12-19 DIAGNOSIS — E6609 Other obesity due to excess calories: Secondary | ICD-10-CM | POA: Diagnosis not present

## 2021-12-19 DIAGNOSIS — E785 Hyperlipidemia, unspecified: Secondary | ICD-10-CM | POA: Diagnosis not present

## 2021-12-20 DIAGNOSIS — M6281 Muscle weakness (generalized): Secondary | ICD-10-CM | POA: Diagnosis not present

## 2021-12-20 DIAGNOSIS — R2689 Other abnormalities of gait and mobility: Secondary | ICD-10-CM | POA: Diagnosis not present

## 2021-12-21 DIAGNOSIS — M6281 Muscle weakness (generalized): Secondary | ICD-10-CM | POA: Diagnosis not present

## 2021-12-21 DIAGNOSIS — R2689 Other abnormalities of gait and mobility: Secondary | ICD-10-CM | POA: Diagnosis not present

## 2021-12-22 DIAGNOSIS — M6281 Muscle weakness (generalized): Secondary | ICD-10-CM | POA: Diagnosis not present

## 2021-12-23 DIAGNOSIS — M6281 Muscle weakness (generalized): Secondary | ICD-10-CM | POA: Diagnosis not present

## 2021-12-23 DIAGNOSIS — R2689 Other abnormalities of gait and mobility: Secondary | ICD-10-CM | POA: Diagnosis not present

## 2021-12-26 DIAGNOSIS — M6281 Muscle weakness (generalized): Secondary | ICD-10-CM | POA: Diagnosis not present

## 2021-12-26 DIAGNOSIS — R2689 Other abnormalities of gait and mobility: Secondary | ICD-10-CM | POA: Diagnosis not present

## 2021-12-27 DIAGNOSIS — R2689 Other abnormalities of gait and mobility: Secondary | ICD-10-CM | POA: Diagnosis not present

## 2021-12-27 DIAGNOSIS — M6281 Muscle weakness (generalized): Secondary | ICD-10-CM | POA: Diagnosis not present

## 2021-12-28 DIAGNOSIS — M6281 Muscle weakness (generalized): Secondary | ICD-10-CM | POA: Diagnosis not present

## 2021-12-30 DIAGNOSIS — M6281 Muscle weakness (generalized): Secondary | ICD-10-CM | POA: Diagnosis not present

## 2021-12-30 DIAGNOSIS — R2689 Other abnormalities of gait and mobility: Secondary | ICD-10-CM | POA: Diagnosis not present

## 2022-01-02 DIAGNOSIS — R2689 Other abnormalities of gait and mobility: Secondary | ICD-10-CM | POA: Diagnosis not present

## 2022-01-02 DIAGNOSIS — M6281 Muscle weakness (generalized): Secondary | ICD-10-CM | POA: Diagnosis not present

## 2022-01-03 DIAGNOSIS — M6281 Muscle weakness (generalized): Secondary | ICD-10-CM | POA: Diagnosis not present

## 2022-01-04 DIAGNOSIS — R2689 Other abnormalities of gait and mobility: Secondary | ICD-10-CM | POA: Diagnosis not present

## 2022-01-04 DIAGNOSIS — M6281 Muscle weakness (generalized): Secondary | ICD-10-CM | POA: Diagnosis not present

## 2022-01-05 DIAGNOSIS — M6281 Muscle weakness (generalized): Secondary | ICD-10-CM | POA: Diagnosis not present

## 2022-01-06 DIAGNOSIS — M6281 Muscle weakness (generalized): Secondary | ICD-10-CM | POA: Diagnosis not present

## 2022-01-06 DIAGNOSIS — R2689 Other abnormalities of gait and mobility: Secondary | ICD-10-CM | POA: Diagnosis not present

## 2022-01-09 DIAGNOSIS — M6281 Muscle weakness (generalized): Secondary | ICD-10-CM | POA: Diagnosis not present

## 2022-01-09 DIAGNOSIS — R2689 Other abnormalities of gait and mobility: Secondary | ICD-10-CM | POA: Diagnosis not present

## 2022-01-10 DIAGNOSIS — E6609 Other obesity due to excess calories: Secondary | ICD-10-CM | POA: Diagnosis not present

## 2022-01-10 DIAGNOSIS — E785 Hyperlipidemia, unspecified: Secondary | ICD-10-CM | POA: Diagnosis not present

## 2022-01-12 DIAGNOSIS — E6609 Other obesity due to excess calories: Secondary | ICD-10-CM | POA: Diagnosis not present

## 2022-01-12 DIAGNOSIS — N1832 Chronic kidney disease, stage 3b: Secondary | ICD-10-CM | POA: Diagnosis not present

## 2022-01-12 DIAGNOSIS — R269 Unspecified abnormalities of gait and mobility: Secondary | ICD-10-CM | POA: Diagnosis not present

## 2022-01-12 DIAGNOSIS — R112 Nausea with vomiting, unspecified: Secondary | ICD-10-CM | POA: Diagnosis not present

## 2022-01-12 DIAGNOSIS — G20 Parkinson's disease: Secondary | ICD-10-CM | POA: Diagnosis not present

## 2022-01-12 DIAGNOSIS — E1169 Type 2 diabetes mellitus with other specified complication: Secondary | ICD-10-CM | POA: Diagnosis not present

## 2022-01-12 DIAGNOSIS — F330 Major depressive disorder, recurrent, mild: Secondary | ICD-10-CM | POA: Diagnosis not present

## 2022-01-16 DIAGNOSIS — J9690 Respiratory failure, unspecified, unspecified whether with hypoxia or hypercapnia: Secondary | ICD-10-CM | POA: Diagnosis not present

## 2022-01-16 DIAGNOSIS — F0280 Dementia in other diseases classified elsewhere without behavioral disturbance: Secondary | ICD-10-CM | POA: Diagnosis not present

## 2022-01-16 DIAGNOSIS — J90 Pleural effusion, not elsewhere classified: Secondary | ICD-10-CM | POA: Diagnosis not present

## 2022-01-16 DIAGNOSIS — Z794 Long term (current) use of insulin: Secondary | ICD-10-CM | POA: Diagnosis not present

## 2022-01-16 DIAGNOSIS — R197 Diarrhea, unspecified: Secondary | ICD-10-CM | POA: Diagnosis not present

## 2022-01-16 DIAGNOSIS — A0811 Acute gastroenteropathy due to Norwalk agent: Secondary | ICD-10-CM | POA: Diagnosis not present

## 2022-01-16 DIAGNOSIS — R0602 Shortness of breath: Secondary | ICD-10-CM | POA: Diagnosis not present

## 2022-01-16 DIAGNOSIS — R5383 Other fatigue: Secondary | ICD-10-CM | POA: Diagnosis not present

## 2022-01-16 DIAGNOSIS — R531 Weakness: Secondary | ICD-10-CM | POA: Diagnosis not present

## 2022-01-16 DIAGNOSIS — R0902 Hypoxemia: Secondary | ICD-10-CM | POA: Diagnosis not present

## 2022-01-16 DIAGNOSIS — G20 Parkinson's disease: Secondary | ICD-10-CM | POA: Diagnosis not present

## 2022-01-16 DIAGNOSIS — N179 Acute kidney failure, unspecified: Secondary | ICD-10-CM | POA: Diagnosis not present

## 2022-01-16 DIAGNOSIS — F0283 Dementia in other diseases classified elsewhere, unspecified severity, with mood disturbance: Secondary | ICD-10-CM | POA: Diagnosis not present

## 2022-01-16 DIAGNOSIS — E8809 Other disorders of plasma-protein metabolism, not elsewhere classified: Secondary | ICD-10-CM | POA: Diagnosis not present

## 2022-01-16 DIAGNOSIS — I959 Hypotension, unspecified: Secondary | ICD-10-CM | POA: Diagnosis not present

## 2022-01-16 DIAGNOSIS — E1169 Type 2 diabetes mellitus with other specified complication: Secondary | ICD-10-CM | POA: Diagnosis not present

## 2022-01-16 DIAGNOSIS — R109 Unspecified abdominal pain: Secondary | ICD-10-CM | POA: Diagnosis not present

## 2022-01-16 DIAGNOSIS — J159 Unspecified bacterial pneumonia: Secondary | ICD-10-CM | POA: Diagnosis not present

## 2022-01-16 DIAGNOSIS — N139 Obstructive and reflux uropathy, unspecified: Secondary | ICD-10-CM | POA: Diagnosis not present

## 2022-01-16 DIAGNOSIS — I9589 Other hypotension: Secondary | ICD-10-CM | POA: Diagnosis not present

## 2022-01-16 DIAGNOSIS — R339 Retention of urine, unspecified: Secondary | ICD-10-CM | POA: Diagnosis not present

## 2022-01-16 DIAGNOSIS — B370 Candidal stomatitis: Secondary | ICD-10-CM | POA: Diagnosis not present

## 2022-01-16 DIAGNOSIS — Z515 Encounter for palliative care: Secondary | ICD-10-CM | POA: Diagnosis not present

## 2022-01-16 DIAGNOSIS — E861 Hypovolemia: Secondary | ICD-10-CM | POA: Diagnosis not present

## 2022-01-16 DIAGNOSIS — I5189 Other ill-defined heart diseases: Secondary | ICD-10-CM | POA: Diagnosis not present

## 2022-01-16 DIAGNOSIS — R079 Chest pain, unspecified: Secondary | ICD-10-CM | POA: Diagnosis not present

## 2022-01-16 DIAGNOSIS — R918 Other nonspecific abnormal finding of lung field: Secondary | ICD-10-CM | POA: Diagnosis not present

## 2022-01-16 DIAGNOSIS — R059 Cough, unspecified: Secondary | ICD-10-CM | POA: Diagnosis not present

## 2022-01-16 DIAGNOSIS — J9691 Respiratory failure, unspecified with hypoxia: Secondary | ICD-10-CM | POA: Diagnosis not present

## 2022-01-16 DIAGNOSIS — U071 COVID-19: Secondary | ICD-10-CM | POA: Diagnosis not present

## 2022-01-16 DIAGNOSIS — R5381 Other malaise: Secondary | ICD-10-CM | POA: Diagnosis not present

## 2022-01-16 DIAGNOSIS — J1282 Pneumonia due to coronavirus disease 2019: Secondary | ICD-10-CM | POA: Diagnosis not present

## 2022-01-16 DIAGNOSIS — J9601 Acute respiratory failure with hypoxia: Secondary | ICD-10-CM | POA: Diagnosis not present

## 2022-01-16 DIAGNOSIS — E119 Type 2 diabetes mellitus without complications: Secondary | ICD-10-CM | POA: Diagnosis not present

## 2022-01-16 DIAGNOSIS — D649 Anemia, unspecified: Secondary | ICD-10-CM | POA: Diagnosis not present

## 2022-01-16 DIAGNOSIS — F32A Depression, unspecified: Secondary | ICD-10-CM | POA: Diagnosis not present

## 2022-01-16 DIAGNOSIS — E1165 Type 2 diabetes mellitus with hyperglycemia: Secondary | ICD-10-CM | POA: Diagnosis not present

## 2022-01-16 DIAGNOSIS — E785 Hyperlipidemia, unspecified: Secondary | ICD-10-CM | POA: Diagnosis not present

## 2022-01-17 DIAGNOSIS — R0902 Hypoxemia: Secondary | ICD-10-CM | POA: Diagnosis not present

## 2022-01-18 DIAGNOSIS — E1165 Type 2 diabetes mellitus with hyperglycemia: Secondary | ICD-10-CM | POA: Diagnosis not present

## 2022-01-18 DIAGNOSIS — J1282 Pneumonia due to coronavirus disease 2019: Secondary | ICD-10-CM | POA: Diagnosis not present

## 2022-01-18 DIAGNOSIS — F0280 Dementia in other diseases classified elsewhere without behavioral disturbance: Secondary | ICD-10-CM | POA: Diagnosis not present

## 2022-01-18 DIAGNOSIS — R0902 Hypoxemia: Secondary | ICD-10-CM | POA: Diagnosis not present

## 2022-01-18 DIAGNOSIS — J90 Pleural effusion, not elsewhere classified: Secondary | ICD-10-CM | POA: Diagnosis not present

## 2022-01-18 DIAGNOSIS — N139 Obstructive and reflux uropathy, unspecified: Secondary | ICD-10-CM | POA: Diagnosis not present

## 2022-01-18 DIAGNOSIS — G20 Parkinson's disease: Secondary | ICD-10-CM | POA: Diagnosis not present

## 2022-01-18 DIAGNOSIS — J9601 Acute respiratory failure with hypoxia: Secondary | ICD-10-CM | POA: Diagnosis not present

## 2022-01-18 DIAGNOSIS — R339 Retention of urine, unspecified: Secondary | ICD-10-CM | POA: Diagnosis not present

## 2022-01-18 DIAGNOSIS — U071 COVID-19: Secondary | ICD-10-CM | POA: Diagnosis not present

## 2022-01-23 DIAGNOSIS — R0902 Hypoxemia: Secondary | ICD-10-CM | POA: Diagnosis not present

## 2022-01-23 DIAGNOSIS — J90 Pleural effusion, not elsewhere classified: Secondary | ICD-10-CM | POA: Diagnosis not present

## 2022-01-23 DIAGNOSIS — E1169 Type 2 diabetes mellitus with other specified complication: Secondary | ICD-10-CM | POA: Diagnosis not present

## 2022-01-23 DIAGNOSIS — U071 COVID-19: Secondary | ICD-10-CM | POA: Diagnosis not present

## 2022-01-23 DIAGNOSIS — J1282 Pneumonia due to coronavirus disease 2019: Secondary | ICD-10-CM | POA: Diagnosis not present

## 2022-01-24 DIAGNOSIS — E1169 Type 2 diabetes mellitus with other specified complication: Secondary | ICD-10-CM | POA: Diagnosis not present

## 2022-01-24 DIAGNOSIS — U071 COVID-19: Secondary | ICD-10-CM | POA: Diagnosis not present

## 2022-01-24 DIAGNOSIS — J1282 Pneumonia due to coronavirus disease 2019: Secondary | ICD-10-CM | POA: Diagnosis not present

## 2022-01-24 DIAGNOSIS — J90 Pleural effusion, not elsewhere classified: Secondary | ICD-10-CM | POA: Diagnosis not present

## 2022-01-24 DIAGNOSIS — R0902 Hypoxemia: Secondary | ICD-10-CM | POA: Diagnosis not present

## 2022-01-25 DIAGNOSIS — R0902 Hypoxemia: Secondary | ICD-10-CM | POA: Diagnosis not present

## 2022-01-26 DIAGNOSIS — R0902 Hypoxemia: Secondary | ICD-10-CM | POA: Diagnosis not present

## 2022-01-27 DIAGNOSIS — R0902 Hypoxemia: Secondary | ICD-10-CM | POA: Diagnosis not present

## 2022-01-28 DIAGNOSIS — R0902 Hypoxemia: Secondary | ICD-10-CM | POA: Diagnosis not present

## 2022-01-29 DIAGNOSIS — R0902 Hypoxemia: Secondary | ICD-10-CM | POA: Diagnosis not present

## 2022-01-30 DIAGNOSIS — R0902 Hypoxemia: Secondary | ICD-10-CM | POA: Diagnosis not present

## 2022-01-31 DIAGNOSIS — R0902 Hypoxemia: Secondary | ICD-10-CM | POA: Diagnosis not present

## 2022-03-22 DIAGNOSIS — E785 Hyperlipidemia, unspecified: Secondary | ICD-10-CM | POA: Diagnosis not present

## 2022-03-22 DIAGNOSIS — E1340 Other specified diabetes mellitus with diabetic neuropathy, unspecified: Secondary | ICD-10-CM | POA: Diagnosis not present

## 2022-03-22 DIAGNOSIS — G20C Parkinsonism, unspecified: Secondary | ICD-10-CM | POA: Diagnosis not present

## 2022-03-22 DIAGNOSIS — L89150 Pressure ulcer of sacral region, unstageable: Secondary | ICD-10-CM | POA: Diagnosis not present

## 2022-04-14 DIAGNOSIS — Z79899 Other long term (current) drug therapy: Secondary | ICD-10-CM | POA: Diagnosis not present

## 2022-04-25 DIAGNOSIS — F03B18 Unspecified dementia, moderate, with other behavioral disturbance: Secondary | ICD-10-CM | POA: Diagnosis not present

## 2022-04-25 DIAGNOSIS — F331 Major depressive disorder, recurrent, moderate: Secondary | ICD-10-CM | POA: Diagnosis not present

## 2022-04-25 DIAGNOSIS — F418 Other specified anxiety disorders: Secondary | ICD-10-CM | POA: Diagnosis not present

## 2022-04-26 DIAGNOSIS — G20C Parkinsonism, unspecified: Secondary | ICD-10-CM | POA: Diagnosis not present

## 2022-04-26 DIAGNOSIS — M6281 Muscle weakness (generalized): Secondary | ICD-10-CM | POA: Diagnosis not present

## 2022-04-26 DIAGNOSIS — F0280 Dementia in other diseases classified elsewhere without behavioral disturbance: Secondary | ICD-10-CM | POA: Diagnosis not present

## 2022-04-26 DIAGNOSIS — R278 Other lack of coordination: Secondary | ICD-10-CM | POA: Diagnosis not present

## 2022-04-27 DIAGNOSIS — M6281 Muscle weakness (generalized): Secondary | ICD-10-CM | POA: Diagnosis not present

## 2022-04-27 DIAGNOSIS — G20C Parkinsonism, unspecified: Secondary | ICD-10-CM | POA: Diagnosis not present

## 2022-04-27 DIAGNOSIS — R278 Other lack of coordination: Secondary | ICD-10-CM | POA: Diagnosis not present

## 2022-04-27 DIAGNOSIS — F0280 Dementia in other diseases classified elsewhere without behavioral disturbance: Secondary | ICD-10-CM | POA: Diagnosis not present

## 2022-04-28 DIAGNOSIS — R278 Other lack of coordination: Secondary | ICD-10-CM | POA: Diagnosis not present

## 2022-04-28 DIAGNOSIS — G20C Parkinsonism, unspecified: Secondary | ICD-10-CM | POA: Diagnosis not present

## 2022-04-28 DIAGNOSIS — M6281 Muscle weakness (generalized): Secondary | ICD-10-CM | POA: Diagnosis not present

## 2022-04-28 DIAGNOSIS — F0280 Dementia in other diseases classified elsewhere without behavioral disturbance: Secondary | ICD-10-CM | POA: Diagnosis not present

## 2022-04-29 DIAGNOSIS — R278 Other lack of coordination: Secondary | ICD-10-CM | POA: Diagnosis not present

## 2022-04-29 DIAGNOSIS — G20C Parkinsonism, unspecified: Secondary | ICD-10-CM | POA: Diagnosis not present

## 2022-04-29 DIAGNOSIS — F0280 Dementia in other diseases classified elsewhere without behavioral disturbance: Secondary | ICD-10-CM | POA: Diagnosis not present

## 2022-04-29 DIAGNOSIS — M6281 Muscle weakness (generalized): Secondary | ICD-10-CM | POA: Diagnosis not present

## 2022-05-01 DIAGNOSIS — R278 Other lack of coordination: Secondary | ICD-10-CM | POA: Diagnosis not present

## 2022-05-01 DIAGNOSIS — F0280 Dementia in other diseases classified elsewhere without behavioral disturbance: Secondary | ICD-10-CM | POA: Diagnosis not present

## 2022-05-01 DIAGNOSIS — M6281 Muscle weakness (generalized): Secondary | ICD-10-CM | POA: Diagnosis not present

## 2022-05-01 DIAGNOSIS — G20C Parkinsonism, unspecified: Secondary | ICD-10-CM | POA: Diagnosis not present

## 2022-05-02 DIAGNOSIS — F0280 Dementia in other diseases classified elsewhere without behavioral disturbance: Secondary | ICD-10-CM | POA: Diagnosis not present

## 2022-05-02 DIAGNOSIS — M6281 Muscle weakness (generalized): Secondary | ICD-10-CM | POA: Diagnosis not present

## 2022-05-02 DIAGNOSIS — R278 Other lack of coordination: Secondary | ICD-10-CM | POA: Diagnosis not present

## 2022-05-02 DIAGNOSIS — G20C Parkinsonism, unspecified: Secondary | ICD-10-CM | POA: Diagnosis not present

## 2022-05-03 DIAGNOSIS — M6281 Muscle weakness (generalized): Secondary | ICD-10-CM | POA: Diagnosis not present

## 2022-05-03 DIAGNOSIS — R278 Other lack of coordination: Secondary | ICD-10-CM | POA: Diagnosis not present

## 2022-05-03 DIAGNOSIS — F0280 Dementia in other diseases classified elsewhere without behavioral disturbance: Secondary | ICD-10-CM | POA: Diagnosis not present

## 2022-05-03 DIAGNOSIS — G20C Parkinsonism, unspecified: Secondary | ICD-10-CM | POA: Diagnosis not present

## 2022-05-04 DIAGNOSIS — F0280 Dementia in other diseases classified elsewhere without behavioral disturbance: Secondary | ICD-10-CM | POA: Diagnosis not present

## 2022-05-04 DIAGNOSIS — G20C Parkinsonism, unspecified: Secondary | ICD-10-CM | POA: Diagnosis not present

## 2022-05-04 DIAGNOSIS — M6281 Muscle weakness (generalized): Secondary | ICD-10-CM | POA: Diagnosis not present

## 2022-05-04 DIAGNOSIS — R278 Other lack of coordination: Secondary | ICD-10-CM | POA: Diagnosis not present

## 2022-05-05 DIAGNOSIS — F0280 Dementia in other diseases classified elsewhere without behavioral disturbance: Secondary | ICD-10-CM | POA: Diagnosis not present

## 2022-05-05 DIAGNOSIS — G20C Parkinsonism, unspecified: Secondary | ICD-10-CM | POA: Diagnosis not present

## 2022-05-05 DIAGNOSIS — R278 Other lack of coordination: Secondary | ICD-10-CM | POA: Diagnosis not present

## 2022-05-05 DIAGNOSIS — M6281 Muscle weakness (generalized): Secondary | ICD-10-CM | POA: Diagnosis not present

## 2022-05-06 DIAGNOSIS — F0280 Dementia in other diseases classified elsewhere without behavioral disturbance: Secondary | ICD-10-CM | POA: Diagnosis not present

## 2022-05-06 DIAGNOSIS — M6281 Muscle weakness (generalized): Secondary | ICD-10-CM | POA: Diagnosis not present

## 2022-05-06 DIAGNOSIS — G20C Parkinsonism, unspecified: Secondary | ICD-10-CM | POA: Diagnosis not present

## 2022-05-06 DIAGNOSIS — R278 Other lack of coordination: Secondary | ICD-10-CM | POA: Diagnosis not present

## 2022-05-08 DIAGNOSIS — G20C Parkinsonism, unspecified: Secondary | ICD-10-CM | POA: Diagnosis not present

## 2022-05-08 DIAGNOSIS — M6281 Muscle weakness (generalized): Secondary | ICD-10-CM | POA: Diagnosis not present

## 2022-05-08 DIAGNOSIS — F0280 Dementia in other diseases classified elsewhere without behavioral disturbance: Secondary | ICD-10-CM | POA: Diagnosis not present

## 2022-05-08 DIAGNOSIS — R278 Other lack of coordination: Secondary | ICD-10-CM | POA: Diagnosis not present

## 2022-05-09 DIAGNOSIS — M6281 Muscle weakness (generalized): Secondary | ICD-10-CM | POA: Diagnosis not present

## 2022-05-09 DIAGNOSIS — F03B18 Unspecified dementia, moderate, with other behavioral disturbance: Secondary | ICD-10-CM | POA: Diagnosis not present

## 2022-05-09 DIAGNOSIS — R278 Other lack of coordination: Secondary | ICD-10-CM | POA: Diagnosis not present

## 2022-05-09 DIAGNOSIS — F0280 Dementia in other diseases classified elsewhere without behavioral disturbance: Secondary | ICD-10-CM | POA: Diagnosis not present

## 2022-05-09 DIAGNOSIS — F418 Other specified anxiety disorders: Secondary | ICD-10-CM | POA: Diagnosis not present

## 2022-05-09 DIAGNOSIS — G20C Parkinsonism, unspecified: Secondary | ICD-10-CM | POA: Diagnosis not present

## 2022-05-09 DIAGNOSIS — F331 Major depressive disorder, recurrent, moderate: Secondary | ICD-10-CM | POA: Diagnosis not present

## 2022-05-10 DIAGNOSIS — M6281 Muscle weakness (generalized): Secondary | ICD-10-CM | POA: Diagnosis not present

## 2022-05-10 DIAGNOSIS — F0280 Dementia in other diseases classified elsewhere without behavioral disturbance: Secondary | ICD-10-CM | POA: Diagnosis not present

## 2022-05-10 DIAGNOSIS — R278 Other lack of coordination: Secondary | ICD-10-CM | POA: Diagnosis not present

## 2022-05-10 DIAGNOSIS — G20C Parkinsonism, unspecified: Secondary | ICD-10-CM | POA: Diagnosis not present

## 2022-05-11 DIAGNOSIS — Z466 Encounter for fitting and adjustment of urinary device: Secondary | ICD-10-CM | POA: Diagnosis not present

## 2022-05-11 DIAGNOSIS — R339 Retention of urine, unspecified: Secondary | ICD-10-CM | POA: Diagnosis not present

## 2022-05-11 DIAGNOSIS — G20C Parkinsonism, unspecified: Secondary | ICD-10-CM | POA: Diagnosis not present

## 2022-05-11 DIAGNOSIS — R278 Other lack of coordination: Secondary | ICD-10-CM | POA: Diagnosis not present

## 2022-05-11 DIAGNOSIS — M6281 Muscle weakness (generalized): Secondary | ICD-10-CM | POA: Diagnosis not present

## 2022-05-11 DIAGNOSIS — F0280 Dementia in other diseases classified elsewhere without behavioral disturbance: Secondary | ICD-10-CM | POA: Diagnosis not present

## 2022-05-12 DIAGNOSIS — M6281 Muscle weakness (generalized): Secondary | ICD-10-CM | POA: Diagnosis not present

## 2022-05-12 DIAGNOSIS — G20C Parkinsonism, unspecified: Secondary | ICD-10-CM | POA: Diagnosis not present

## 2022-05-12 DIAGNOSIS — R278 Other lack of coordination: Secondary | ICD-10-CM | POA: Diagnosis not present

## 2022-05-12 DIAGNOSIS — F0280 Dementia in other diseases classified elsewhere without behavioral disturbance: Secondary | ICD-10-CM | POA: Diagnosis not present

## 2022-05-13 DIAGNOSIS — F0280 Dementia in other diseases classified elsewhere without behavioral disturbance: Secondary | ICD-10-CM | POA: Diagnosis not present

## 2022-05-13 DIAGNOSIS — M6281 Muscle weakness (generalized): Secondary | ICD-10-CM | POA: Diagnosis not present

## 2022-05-13 DIAGNOSIS — R278 Other lack of coordination: Secondary | ICD-10-CM | POA: Diagnosis not present

## 2022-05-13 DIAGNOSIS — G20C Parkinsonism, unspecified: Secondary | ICD-10-CM | POA: Diagnosis not present

## 2022-05-15 DIAGNOSIS — M6281 Muscle weakness (generalized): Secondary | ICD-10-CM | POA: Diagnosis not present

## 2022-05-15 DIAGNOSIS — R278 Other lack of coordination: Secondary | ICD-10-CM | POA: Diagnosis not present

## 2022-05-15 DIAGNOSIS — F0280 Dementia in other diseases classified elsewhere without behavioral disturbance: Secondary | ICD-10-CM | POA: Diagnosis not present

## 2022-05-15 DIAGNOSIS — G20C Parkinsonism, unspecified: Secondary | ICD-10-CM | POA: Diagnosis not present

## 2022-05-16 DIAGNOSIS — M6281 Muscle weakness (generalized): Secondary | ICD-10-CM | POA: Diagnosis not present

## 2022-05-16 DIAGNOSIS — R278 Other lack of coordination: Secondary | ICD-10-CM | POA: Diagnosis not present

## 2022-05-16 DIAGNOSIS — G20C Parkinsonism, unspecified: Secondary | ICD-10-CM | POA: Diagnosis not present

## 2022-05-16 DIAGNOSIS — F0280 Dementia in other diseases classified elsewhere without behavioral disturbance: Secondary | ICD-10-CM | POA: Diagnosis not present

## 2022-05-17 DIAGNOSIS — M6281 Muscle weakness (generalized): Secondary | ICD-10-CM | POA: Diagnosis not present

## 2022-05-17 DIAGNOSIS — G20C Parkinsonism, unspecified: Secondary | ICD-10-CM | POA: Diagnosis not present

## 2022-05-17 DIAGNOSIS — F0280 Dementia in other diseases classified elsewhere without behavioral disturbance: Secondary | ICD-10-CM | POA: Diagnosis not present

## 2022-05-17 DIAGNOSIS — R278 Other lack of coordination: Secondary | ICD-10-CM | POA: Diagnosis not present

## 2022-05-18 DIAGNOSIS — F0280 Dementia in other diseases classified elsewhere without behavioral disturbance: Secondary | ICD-10-CM | POA: Diagnosis not present

## 2022-05-18 DIAGNOSIS — R278 Other lack of coordination: Secondary | ICD-10-CM | POA: Diagnosis not present

## 2022-05-18 DIAGNOSIS — M6281 Muscle weakness (generalized): Secondary | ICD-10-CM | POA: Diagnosis not present

## 2022-05-18 DIAGNOSIS — G20C Parkinsonism, unspecified: Secondary | ICD-10-CM | POA: Diagnosis not present

## 2022-05-19 DIAGNOSIS — R278 Other lack of coordination: Secondary | ICD-10-CM | POA: Diagnosis not present

## 2022-05-19 DIAGNOSIS — G20C Parkinsonism, unspecified: Secondary | ICD-10-CM | POA: Diagnosis not present

## 2022-05-19 DIAGNOSIS — F0280 Dementia in other diseases classified elsewhere without behavioral disturbance: Secondary | ICD-10-CM | POA: Diagnosis not present

## 2022-05-19 DIAGNOSIS — M6281 Muscle weakness (generalized): Secondary | ICD-10-CM | POA: Diagnosis not present

## 2022-05-20 DIAGNOSIS — F0280 Dementia in other diseases classified elsewhere without behavioral disturbance: Secondary | ICD-10-CM | POA: Diagnosis not present

## 2022-05-20 DIAGNOSIS — M6281 Muscle weakness (generalized): Secondary | ICD-10-CM | POA: Diagnosis not present

## 2022-05-20 DIAGNOSIS — G20C Parkinsonism, unspecified: Secondary | ICD-10-CM | POA: Diagnosis not present

## 2022-05-20 DIAGNOSIS — R278 Other lack of coordination: Secondary | ICD-10-CM | POA: Diagnosis not present

## 2022-05-23 DIAGNOSIS — M6281 Muscle weakness (generalized): Secondary | ICD-10-CM | POA: Diagnosis not present

## 2022-05-23 DIAGNOSIS — G20C Parkinsonism, unspecified: Secondary | ICD-10-CM | POA: Diagnosis not present

## 2022-05-23 DIAGNOSIS — F0280 Dementia in other diseases classified elsewhere without behavioral disturbance: Secondary | ICD-10-CM | POA: Diagnosis not present

## 2022-05-23 DIAGNOSIS — R278 Other lack of coordination: Secondary | ICD-10-CM | POA: Diagnosis not present

## 2022-05-24 DIAGNOSIS — G20C Parkinsonism, unspecified: Secondary | ICD-10-CM | POA: Diagnosis not present

## 2022-05-24 DIAGNOSIS — M6281 Muscle weakness (generalized): Secondary | ICD-10-CM | POA: Diagnosis not present

## 2022-05-24 DIAGNOSIS — F0280 Dementia in other diseases classified elsewhere without behavioral disturbance: Secondary | ICD-10-CM | POA: Diagnosis not present

## 2022-05-24 DIAGNOSIS — R278 Other lack of coordination: Secondary | ICD-10-CM | POA: Diagnosis not present

## 2022-05-25 DIAGNOSIS — F0280 Dementia in other diseases classified elsewhere without behavioral disturbance: Secondary | ICD-10-CM | POA: Diagnosis not present

## 2022-05-25 DIAGNOSIS — M6281 Muscle weakness (generalized): Secondary | ICD-10-CM | POA: Diagnosis not present

## 2022-05-25 DIAGNOSIS — R278 Other lack of coordination: Secondary | ICD-10-CM | POA: Diagnosis not present

## 2022-05-25 DIAGNOSIS — G20C Parkinsonism, unspecified: Secondary | ICD-10-CM | POA: Diagnosis not present

## 2022-05-26 DIAGNOSIS — R278 Other lack of coordination: Secondary | ICD-10-CM | POA: Diagnosis not present

## 2022-05-26 DIAGNOSIS — F0280 Dementia in other diseases classified elsewhere without behavioral disturbance: Secondary | ICD-10-CM | POA: Diagnosis not present

## 2022-05-26 DIAGNOSIS — G20C Parkinsonism, unspecified: Secondary | ICD-10-CM | POA: Diagnosis not present

## 2022-05-26 DIAGNOSIS — M6281 Muscle weakness (generalized): Secondary | ICD-10-CM | POA: Diagnosis not present

## 2022-05-27 DIAGNOSIS — G20C Parkinsonism, unspecified: Secondary | ICD-10-CM | POA: Diagnosis not present

## 2022-05-27 DIAGNOSIS — R278 Other lack of coordination: Secondary | ICD-10-CM | POA: Diagnosis not present

## 2022-05-27 DIAGNOSIS — M6281 Muscle weakness (generalized): Secondary | ICD-10-CM | POA: Diagnosis not present

## 2022-05-27 DIAGNOSIS — F0280 Dementia in other diseases classified elsewhere without behavioral disturbance: Secondary | ICD-10-CM | POA: Diagnosis not present

## 2022-05-29 DIAGNOSIS — R278 Other lack of coordination: Secondary | ICD-10-CM | POA: Diagnosis not present

## 2022-05-29 DIAGNOSIS — G20C Parkinsonism, unspecified: Secondary | ICD-10-CM | POA: Diagnosis not present

## 2022-05-29 DIAGNOSIS — M6281 Muscle weakness (generalized): Secondary | ICD-10-CM | POA: Diagnosis not present

## 2022-05-29 DIAGNOSIS — F0280 Dementia in other diseases classified elsewhere without behavioral disturbance: Secondary | ICD-10-CM | POA: Diagnosis not present

## 2022-05-30 DIAGNOSIS — G20C Parkinsonism, unspecified: Secondary | ICD-10-CM | POA: Diagnosis not present

## 2022-05-30 DIAGNOSIS — R278 Other lack of coordination: Secondary | ICD-10-CM | POA: Diagnosis not present

## 2022-05-30 DIAGNOSIS — M6281 Muscle weakness (generalized): Secondary | ICD-10-CM | POA: Diagnosis not present

## 2022-05-30 DIAGNOSIS — F0280 Dementia in other diseases classified elsewhere without behavioral disturbance: Secondary | ICD-10-CM | POA: Diagnosis not present

## 2022-05-31 DIAGNOSIS — R278 Other lack of coordination: Secondary | ICD-10-CM | POA: Diagnosis not present

## 2022-05-31 DIAGNOSIS — F0280 Dementia in other diseases classified elsewhere without behavioral disturbance: Secondary | ICD-10-CM | POA: Diagnosis not present

## 2022-05-31 DIAGNOSIS — M6281 Muscle weakness (generalized): Secondary | ICD-10-CM | POA: Diagnosis not present

## 2022-05-31 DIAGNOSIS — G20C Parkinsonism, unspecified: Secondary | ICD-10-CM | POA: Diagnosis not present

## 2022-06-01 DIAGNOSIS — M6281 Muscle weakness (generalized): Secondary | ICD-10-CM | POA: Diagnosis not present

## 2022-06-01 DIAGNOSIS — G20C Parkinsonism, unspecified: Secondary | ICD-10-CM | POA: Diagnosis not present

## 2022-06-01 DIAGNOSIS — R278 Other lack of coordination: Secondary | ICD-10-CM | POA: Diagnosis not present

## 2022-06-01 DIAGNOSIS — F0280 Dementia in other diseases classified elsewhere without behavioral disturbance: Secondary | ICD-10-CM | POA: Diagnosis not present

## 2022-06-02 DIAGNOSIS — M6281 Muscle weakness (generalized): Secondary | ICD-10-CM | POA: Diagnosis not present

## 2022-06-02 DIAGNOSIS — F0280 Dementia in other diseases classified elsewhere without behavioral disturbance: Secondary | ICD-10-CM | POA: Diagnosis not present

## 2022-06-02 DIAGNOSIS — R278 Other lack of coordination: Secondary | ICD-10-CM | POA: Diagnosis not present

## 2022-06-02 DIAGNOSIS — G20C Parkinsonism, unspecified: Secondary | ICD-10-CM | POA: Diagnosis not present

## 2022-06-03 DIAGNOSIS — G20C Parkinsonism, unspecified: Secondary | ICD-10-CM | POA: Diagnosis not present

## 2022-06-03 DIAGNOSIS — F0280 Dementia in other diseases classified elsewhere without behavioral disturbance: Secondary | ICD-10-CM | POA: Diagnosis not present

## 2022-06-03 DIAGNOSIS — R278 Other lack of coordination: Secondary | ICD-10-CM | POA: Diagnosis not present

## 2022-06-03 DIAGNOSIS — M6281 Muscle weakness (generalized): Secondary | ICD-10-CM | POA: Diagnosis not present

## 2022-06-05 DIAGNOSIS — G20C Parkinsonism, unspecified: Secondary | ICD-10-CM | POA: Diagnosis not present

## 2022-06-05 DIAGNOSIS — M6281 Muscle weakness (generalized): Secondary | ICD-10-CM | POA: Diagnosis not present

## 2022-06-05 DIAGNOSIS — F0280 Dementia in other diseases classified elsewhere without behavioral disturbance: Secondary | ICD-10-CM | POA: Diagnosis not present

## 2022-06-05 DIAGNOSIS — R278 Other lack of coordination: Secondary | ICD-10-CM | POA: Diagnosis not present

## 2022-06-06 DIAGNOSIS — F0280 Dementia in other diseases classified elsewhere without behavioral disturbance: Secondary | ICD-10-CM | POA: Diagnosis not present

## 2022-06-06 DIAGNOSIS — M6281 Muscle weakness (generalized): Secondary | ICD-10-CM | POA: Diagnosis not present

## 2022-06-06 DIAGNOSIS — R278 Other lack of coordination: Secondary | ICD-10-CM | POA: Diagnosis not present

## 2022-06-06 DIAGNOSIS — G20C Parkinsonism, unspecified: Secondary | ICD-10-CM | POA: Diagnosis not present

## 2022-06-07 DIAGNOSIS — G20C Parkinsonism, unspecified: Secondary | ICD-10-CM | POA: Diagnosis not present

## 2022-06-07 DIAGNOSIS — M6281 Muscle weakness (generalized): Secondary | ICD-10-CM | POA: Diagnosis not present

## 2022-06-07 DIAGNOSIS — F0280 Dementia in other diseases classified elsewhere without behavioral disturbance: Secondary | ICD-10-CM | POA: Diagnosis not present

## 2022-06-07 DIAGNOSIS — R278 Other lack of coordination: Secondary | ICD-10-CM | POA: Diagnosis not present

## 2022-06-08 DIAGNOSIS — F0280 Dementia in other diseases classified elsewhere without behavioral disturbance: Secondary | ICD-10-CM | POA: Diagnosis not present

## 2022-06-08 DIAGNOSIS — M6281 Muscle weakness (generalized): Secondary | ICD-10-CM | POA: Diagnosis not present

## 2022-06-08 DIAGNOSIS — G20C Parkinsonism, unspecified: Secondary | ICD-10-CM | POA: Diagnosis not present

## 2022-06-08 DIAGNOSIS — R278 Other lack of coordination: Secondary | ICD-10-CM | POA: Diagnosis not present

## 2022-06-09 DIAGNOSIS — G20C Parkinsonism, unspecified: Secondary | ICD-10-CM | POA: Diagnosis not present

## 2022-06-09 DIAGNOSIS — F0280 Dementia in other diseases classified elsewhere without behavioral disturbance: Secondary | ICD-10-CM | POA: Diagnosis not present

## 2022-06-09 DIAGNOSIS — R278 Other lack of coordination: Secondary | ICD-10-CM | POA: Diagnosis not present

## 2022-06-09 DIAGNOSIS — M6281 Muscle weakness (generalized): Secondary | ICD-10-CM | POA: Diagnosis not present

## 2022-06-10 DIAGNOSIS — R278 Other lack of coordination: Secondary | ICD-10-CM | POA: Diagnosis not present

## 2022-06-10 DIAGNOSIS — G20C Parkinsonism, unspecified: Secondary | ICD-10-CM | POA: Diagnosis not present

## 2022-06-10 DIAGNOSIS — F0280 Dementia in other diseases classified elsewhere without behavioral disturbance: Secondary | ICD-10-CM | POA: Diagnosis not present

## 2022-06-10 DIAGNOSIS — M6281 Muscle weakness (generalized): Secondary | ICD-10-CM | POA: Diagnosis not present

## 2022-06-12 DIAGNOSIS — G20C Parkinsonism, unspecified: Secondary | ICD-10-CM | POA: Diagnosis not present

## 2022-06-12 DIAGNOSIS — M6281 Muscle weakness (generalized): Secondary | ICD-10-CM | POA: Diagnosis not present

## 2022-06-12 DIAGNOSIS — R278 Other lack of coordination: Secondary | ICD-10-CM | POA: Diagnosis not present

## 2022-06-12 DIAGNOSIS — F0280 Dementia in other diseases classified elsewhere without behavioral disturbance: Secondary | ICD-10-CM | POA: Diagnosis not present

## 2022-06-13 DIAGNOSIS — M6281 Muscle weakness (generalized): Secondary | ICD-10-CM | POA: Diagnosis not present

## 2022-06-13 DIAGNOSIS — F331 Major depressive disorder, recurrent, moderate: Secondary | ICD-10-CM | POA: Diagnosis not present

## 2022-06-13 DIAGNOSIS — R278 Other lack of coordination: Secondary | ICD-10-CM | POA: Diagnosis not present

## 2022-06-13 DIAGNOSIS — G20C Parkinsonism, unspecified: Secondary | ICD-10-CM | POA: Diagnosis not present

## 2022-06-13 DIAGNOSIS — F03B18 Unspecified dementia, moderate, with other behavioral disturbance: Secondary | ICD-10-CM | POA: Diagnosis not present

## 2022-06-13 DIAGNOSIS — F0280 Dementia in other diseases classified elsewhere without behavioral disturbance: Secondary | ICD-10-CM | POA: Diagnosis not present

## 2022-06-13 DIAGNOSIS — F418 Other specified anxiety disorders: Secondary | ICD-10-CM | POA: Diagnosis not present

## 2022-06-14 DIAGNOSIS — R278 Other lack of coordination: Secondary | ICD-10-CM | POA: Diagnosis not present

## 2022-06-14 DIAGNOSIS — F0280 Dementia in other diseases classified elsewhere without behavioral disturbance: Secondary | ICD-10-CM | POA: Diagnosis not present

## 2022-06-14 DIAGNOSIS — G20C Parkinsonism, unspecified: Secondary | ICD-10-CM | POA: Diagnosis not present

## 2022-06-14 DIAGNOSIS — M6281 Muscle weakness (generalized): Secondary | ICD-10-CM | POA: Diagnosis not present

## 2022-06-15 DIAGNOSIS — R278 Other lack of coordination: Secondary | ICD-10-CM | POA: Diagnosis not present

## 2022-06-15 DIAGNOSIS — M6281 Muscle weakness (generalized): Secondary | ICD-10-CM | POA: Diagnosis not present

## 2022-06-15 DIAGNOSIS — F0280 Dementia in other diseases classified elsewhere without behavioral disturbance: Secondary | ICD-10-CM | POA: Diagnosis not present

## 2022-06-15 DIAGNOSIS — G20C Parkinsonism, unspecified: Secondary | ICD-10-CM | POA: Diagnosis not present

## 2022-06-16 DIAGNOSIS — M6281 Muscle weakness (generalized): Secondary | ICD-10-CM | POA: Diagnosis not present

## 2022-06-16 DIAGNOSIS — R278 Other lack of coordination: Secondary | ICD-10-CM | POA: Diagnosis not present

## 2022-06-16 DIAGNOSIS — F0280 Dementia in other diseases classified elsewhere without behavioral disturbance: Secondary | ICD-10-CM | POA: Diagnosis not present

## 2022-06-16 DIAGNOSIS — G20C Parkinsonism, unspecified: Secondary | ICD-10-CM | POA: Diagnosis not present

## 2022-06-17 DIAGNOSIS — M6281 Muscle weakness (generalized): Secondary | ICD-10-CM | POA: Diagnosis not present

## 2022-06-17 DIAGNOSIS — R278 Other lack of coordination: Secondary | ICD-10-CM | POA: Diagnosis not present

## 2022-06-17 DIAGNOSIS — F0280 Dementia in other diseases classified elsewhere without behavioral disturbance: Secondary | ICD-10-CM | POA: Diagnosis not present

## 2022-06-17 DIAGNOSIS — G20C Parkinsonism, unspecified: Secondary | ICD-10-CM | POA: Diagnosis not present

## 2022-06-19 DIAGNOSIS — F0280 Dementia in other diseases classified elsewhere without behavioral disturbance: Secondary | ICD-10-CM | POA: Diagnosis not present

## 2022-06-19 DIAGNOSIS — M6281 Muscle weakness (generalized): Secondary | ICD-10-CM | POA: Diagnosis not present

## 2022-06-19 DIAGNOSIS — R278 Other lack of coordination: Secondary | ICD-10-CM | POA: Diagnosis not present

## 2022-06-19 DIAGNOSIS — G20C Parkinsonism, unspecified: Secondary | ICD-10-CM | POA: Diagnosis not present

## 2022-07-17 DIAGNOSIS — E119 Type 2 diabetes mellitus without complications: Secondary | ICD-10-CM | POA: Diagnosis not present

## 2022-07-17 DIAGNOSIS — R634 Abnormal weight loss: Secondary | ICD-10-CM | POA: Diagnosis not present

## 2022-07-18 DIAGNOSIS — F03B18 Unspecified dementia, moderate, with other behavioral disturbance: Secondary | ICD-10-CM | POA: Diagnosis not present

## 2022-07-18 DIAGNOSIS — G20C Parkinsonism, unspecified: Secondary | ICD-10-CM | POA: Diagnosis not present

## 2022-07-18 DIAGNOSIS — F339 Major depressive disorder, recurrent, unspecified: Secondary | ICD-10-CM | POA: Diagnosis not present

## 2022-07-18 DIAGNOSIS — N400 Enlarged prostate without lower urinary tract symptoms: Secondary | ICD-10-CM | POA: Diagnosis not present

## 2022-07-18 DIAGNOSIS — F418 Other specified anxiety disorders: Secondary | ICD-10-CM | POA: Diagnosis not present

## 2022-07-18 DIAGNOSIS — E785 Hyperlipidemia, unspecified: Secondary | ICD-10-CM | POA: Diagnosis not present

## 2022-07-18 DIAGNOSIS — F331 Major depressive disorder, recurrent, moderate: Secondary | ICD-10-CM | POA: Diagnosis not present

## 2022-08-01 DIAGNOSIS — E785 Hyperlipidemia, unspecified: Secondary | ICD-10-CM | POA: Diagnosis not present

## 2022-08-01 DIAGNOSIS — G20C Parkinsonism, unspecified: Secondary | ICD-10-CM | POA: Diagnosis not present

## 2022-08-01 DIAGNOSIS — F339 Major depressive disorder, recurrent, unspecified: Secondary | ICD-10-CM | POA: Diagnosis not present

## 2022-08-01 DIAGNOSIS — N400 Enlarged prostate without lower urinary tract symptoms: Secondary | ICD-10-CM | POA: Diagnosis not present

## 2022-08-07 DIAGNOSIS — R339 Retention of urine, unspecified: Secondary | ICD-10-CM | POA: Diagnosis not present

## 2022-08-09 DIAGNOSIS — G20C Parkinsonism, unspecified: Secondary | ICD-10-CM | POA: Diagnosis not present

## 2022-08-22 DIAGNOSIS — F331 Major depressive disorder, recurrent, moderate: Secondary | ICD-10-CM | POA: Diagnosis not present

## 2022-08-22 DIAGNOSIS — F03B18 Unspecified dementia, moderate, with other behavioral disturbance: Secondary | ICD-10-CM | POA: Diagnosis not present

## 2022-08-22 DIAGNOSIS — F418 Other specified anxiety disorders: Secondary | ICD-10-CM | POA: Diagnosis not present

## 2022-08-24 DIAGNOSIS — R944 Abnormal results of kidney function studies: Secondary | ICD-10-CM | POA: Diagnosis not present

## 2022-09-08 DIAGNOSIS — N390 Urinary tract infection, site not specified: Secondary | ICD-10-CM | POA: Diagnosis not present

## 2022-09-11 DIAGNOSIS — E1165 Type 2 diabetes mellitus with hyperglycemia: Secondary | ICD-10-CM | POA: Diagnosis not present

## 2022-09-11 DIAGNOSIS — N390 Urinary tract infection, site not specified: Secondary | ICD-10-CM | POA: Diagnosis not present

## 2022-09-11 DIAGNOSIS — E876 Hypokalemia: Secondary | ICD-10-CM | POA: Diagnosis not present

## 2022-09-14 DIAGNOSIS — N3001 Acute cystitis with hematuria: Secondary | ICD-10-CM | POA: Diagnosis not present

## 2022-10-03 DIAGNOSIS — F418 Other specified anxiety disorders: Secondary | ICD-10-CM | POA: Diagnosis not present

## 2022-10-03 DIAGNOSIS — F03B18 Unspecified dementia, moderate, with other behavioral disturbance: Secondary | ICD-10-CM | POA: Diagnosis not present

## 2022-10-03 DIAGNOSIS — F331 Major depressive disorder, recurrent, moderate: Secondary | ICD-10-CM | POA: Diagnosis not present

## 2022-10-05 DIAGNOSIS — L209 Atopic dermatitis, unspecified: Secondary | ICD-10-CM | POA: Diagnosis not present

## 2022-10-25 DIAGNOSIS — R8290 Unspecified abnormal findings in urine: Secondary | ICD-10-CM | POA: Diagnosis not present

## 2022-10-27 DIAGNOSIS — N390 Urinary tract infection, site not specified: Secondary | ICD-10-CM | POA: Diagnosis not present

## 2022-11-14 DIAGNOSIS — F418 Other specified anxiety disorders: Secondary | ICD-10-CM | POA: Diagnosis not present

## 2022-11-14 DIAGNOSIS — F331 Major depressive disorder, recurrent, moderate: Secondary | ICD-10-CM | POA: Diagnosis not present

## 2022-11-14 DIAGNOSIS — F03B18 Unspecified dementia, moderate, with other behavioral disturbance: Secondary | ICD-10-CM | POA: Diagnosis not present

## 2022-11-23 DIAGNOSIS — L209 Atopic dermatitis, unspecified: Secondary | ICD-10-CM | POA: Diagnosis not present

## 2022-12-12 DIAGNOSIS — F331 Major depressive disorder, recurrent, moderate: Secondary | ICD-10-CM | POA: Diagnosis not present

## 2022-12-12 DIAGNOSIS — F03B18 Unspecified dementia, moderate, with other behavioral disturbance: Secondary | ICD-10-CM | POA: Diagnosis not present

## 2022-12-12 DIAGNOSIS — F418 Other specified anxiety disorders: Secondary | ICD-10-CM | POA: Diagnosis not present

## 2022-12-19 DIAGNOSIS — F418 Other specified anxiety disorders: Secondary | ICD-10-CM | POA: Diagnosis not present

## 2022-12-19 DIAGNOSIS — F03B18 Unspecified dementia, moderate, with other behavioral disturbance: Secondary | ICD-10-CM | POA: Diagnosis not present

## 2022-12-19 DIAGNOSIS — F331 Major depressive disorder, recurrent, moderate: Secondary | ICD-10-CM | POA: Diagnosis not present

## 2023-01-13 DIAGNOSIS — R0989 Other specified symptoms and signs involving the circulatory and respiratory systems: Secondary | ICD-10-CM | POA: Diagnosis not present

## 2023-01-13 DIAGNOSIS — R112 Nausea with vomiting, unspecified: Secondary | ICD-10-CM | POA: Diagnosis not present

## 2023-01-13 DIAGNOSIS — D649 Anemia, unspecified: Secondary | ICD-10-CM | POA: Diagnosis not present

## 2023-01-13 DIAGNOSIS — R109 Unspecified abdominal pain: Secondary | ICD-10-CM | POA: Diagnosis not present

## 2023-01-15 DIAGNOSIS — E559 Vitamin D deficiency, unspecified: Secondary | ICD-10-CM | POA: Diagnosis not present

## 2023-01-15 DIAGNOSIS — R32 Unspecified urinary incontinence: Secondary | ICD-10-CM | POA: Diagnosis not present

## 2023-01-15 DIAGNOSIS — R634 Abnormal weight loss: Secondary | ICD-10-CM | POA: Diagnosis not present

## 2023-01-15 DIAGNOSIS — E119 Type 2 diabetes mellitus without complications: Secondary | ICD-10-CM | POA: Diagnosis not present

## 2023-01-15 DIAGNOSIS — R109 Unspecified abdominal pain: Secondary | ICD-10-CM | POA: Diagnosis not present

## 2023-01-15 DIAGNOSIS — N390 Urinary tract infection, site not specified: Secondary | ICD-10-CM | POA: Diagnosis not present

## 2023-01-16 DIAGNOSIS — F418 Other specified anxiety disorders: Secondary | ICD-10-CM | POA: Diagnosis not present

## 2023-01-16 DIAGNOSIS — F331 Major depressive disorder, recurrent, moderate: Secondary | ICD-10-CM | POA: Diagnosis not present

## 2023-01-18 DIAGNOSIS — Z515 Encounter for palliative care: Secondary | ICD-10-CM | POA: Diagnosis not present

## 2023-01-26 DIAGNOSIS — Z515 Encounter for palliative care: Secondary | ICD-10-CM | POA: Diagnosis not present

## 2023-03-23 DEATH — deceased

## 2023-12-30 IMAGING — DX DG ABDOMEN 1V
2 series · 2 of 2 positions shown · non-contrast
Comparison: None Available.

CLINICAL DATA: Nausea and vomiting

EXAM:
ABDOMEN - 1 VIEW

[abdomen kub (1 of 2)]
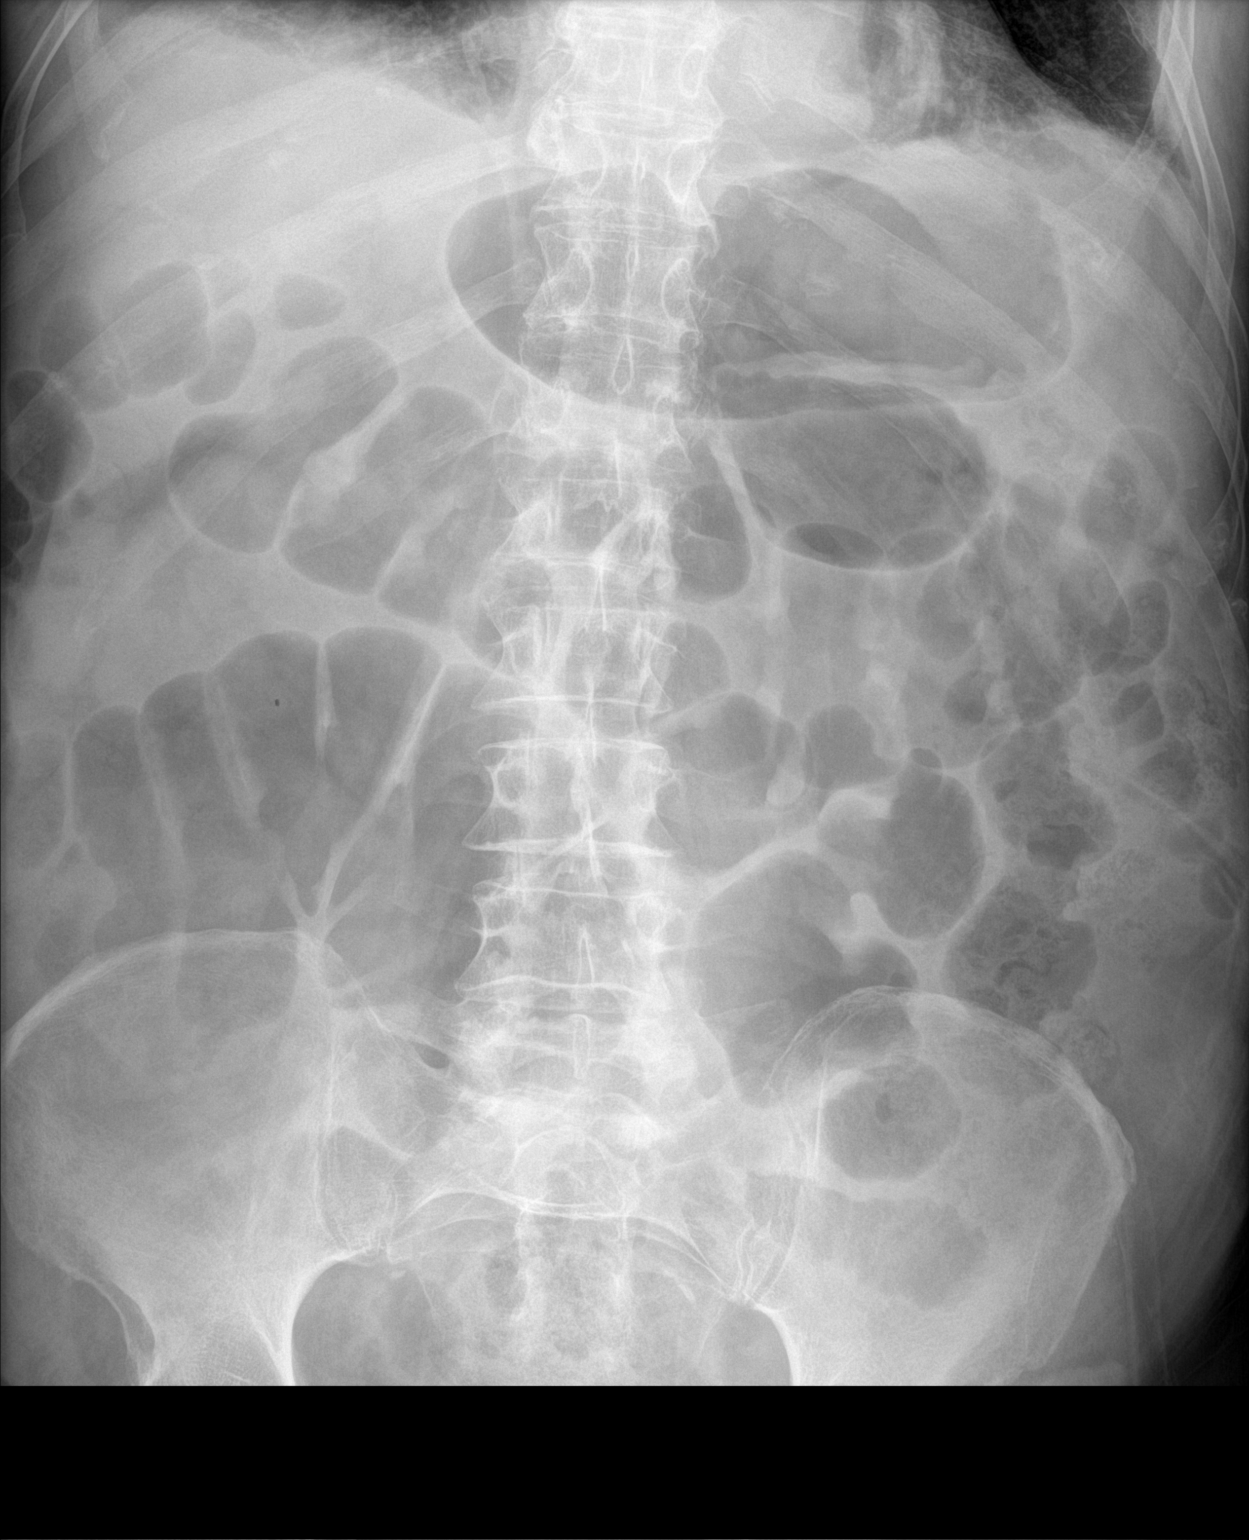

[abdomen kub (2 of 2)]
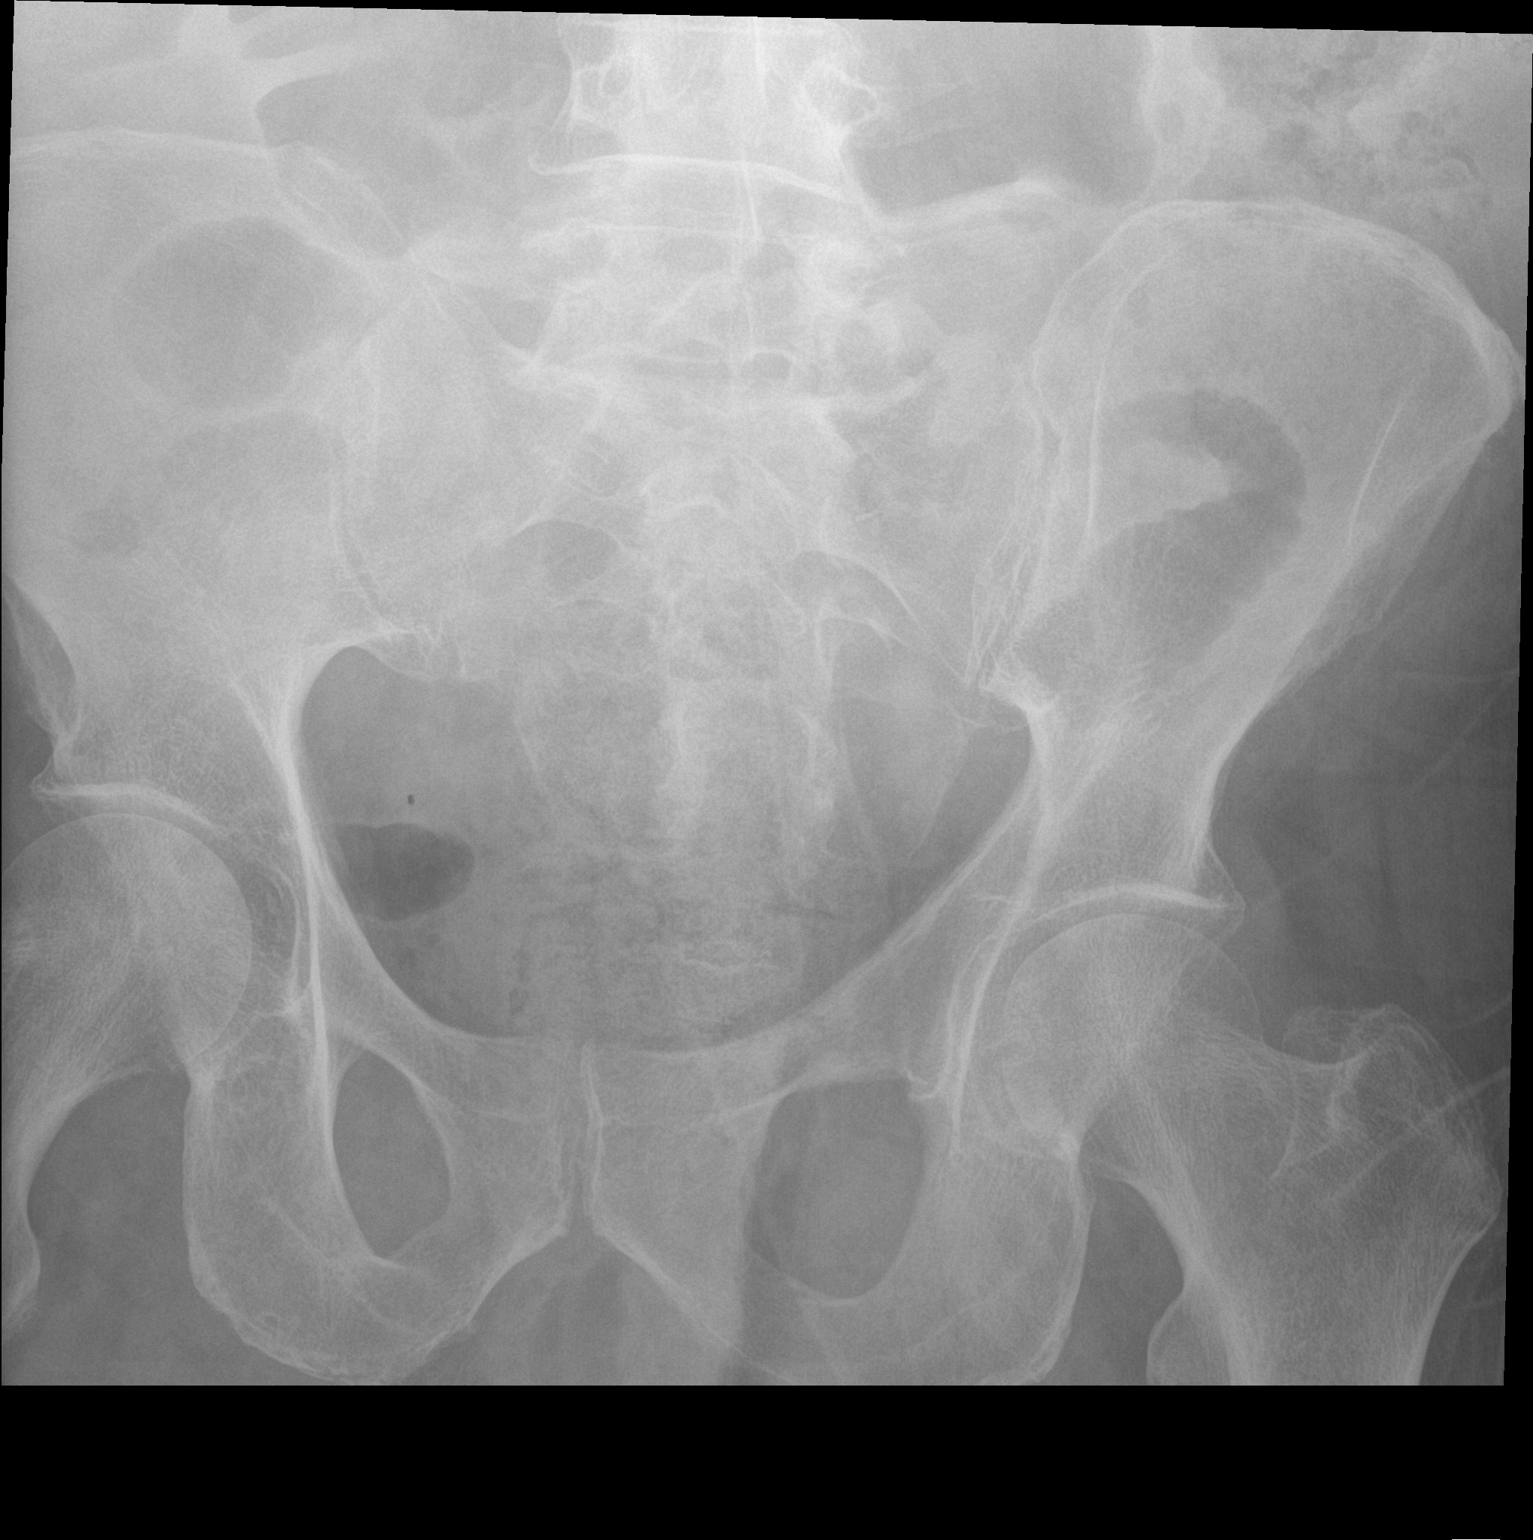

[2 of 2 positions shown; findings below may reference images not displayed]

FINDINGS: Air-filled prominent loops of colon. The ascending colon measures up
to 9.8 cm. Fecal loading in the descending colon and rectum. No
definite small bowel dilatation. The kidneys are obscured but no
renal or ureteral stones are noted. No other acute abnormalities are
identified.
IMPRESSION: Air-filled prominent loops of colon measuring up to 9.8 cm. No small
bowel dilatation noted. The findings most likely represent colonic
ileus. An early distal colonic obstruction could have a similar
appearance. Recommend clinical correlation and attention on
follow-up.

Fecal loading in the descending colon.
